# Patient Record
Sex: Female | Born: 1990
Health system: Southern US, Community
[De-identification: ages and names within clinical notes are randomized; demographics above are authoritative.]

## PROBLEM LIST (undated history)

## (undated) DIAGNOSIS — E559 Vitamin D deficiency, unspecified: Secondary | ICD-10-CM

## (undated) DIAGNOSIS — IMO0002 Reserved for concepts with insufficient information to code with codable children: Secondary | ICD-10-CM

## (undated) DIAGNOSIS — A749 Chlamydial infection, unspecified: Secondary | ICD-10-CM

## (undated) DIAGNOSIS — D649 Anemia, unspecified: Secondary | ICD-10-CM

## (undated) DIAGNOSIS — N946 Dysmenorrhea, unspecified: Secondary | ICD-10-CM

## (undated) DIAGNOSIS — N809 Endometriosis, unspecified: Secondary | ICD-10-CM

## (undated) HISTORY — DX: Chlamydial infection, unspecified: A74.9

## (undated) HISTORY — DX: Reserved for concepts with insufficient information to code with codable children: IMO0002

## (undated) HISTORY — DX: Dysmenorrhea, unspecified: N94.6

## (undated) HISTORY — PX: NO PAST SURGERIES: SHX2092

## (undated) HISTORY — DX: Vitamin D deficiency, unspecified: E55.9

---
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
## 2010-03-13 ENCOUNTER — Other Ambulatory Visit (HOSPITAL_COMMUNITY)
Admission: RE | Admit: 2010-03-13 | Discharge: 2010-03-13 | Disposition: A | Discharge status: Home / Self Care | Payer: Managed Care, Other (non HMO) | Source: Ambulatory Visit | Attending: Family Medicine | Admitting: Family Medicine

## 2010-03-13 ENCOUNTER — Other Ambulatory Visit: Payer: Self-pay | Admitting: Family Medicine

## 2010-03-13 ENCOUNTER — Ambulatory Visit (INDEPENDENT_AMBULATORY_CARE_PROVIDER_SITE_OTHER): Payer: Managed Care, Other (non HMO) | Admitting: Family Medicine

## 2010-03-13 DIAGNOSIS — Z124 Encounter for screening for malignant neoplasm of cervix: Secondary | ICD-10-CM | POA: Insufficient documentation

## 2010-03-13 DIAGNOSIS — Z01419 Encounter for gynecological examination (general) (routine) without abnormal findings: Secondary | ICD-10-CM

## 2010-03-13 DIAGNOSIS — R5381 Other malaise: Secondary | ICD-10-CM

## 2010-03-13 DIAGNOSIS — Z0000 Encounter for general adult medical examination without abnormal findings: Secondary | ICD-10-CM

## 2010-03-13 LAB — HM PAP SMEAR

## 2010-05-12 ENCOUNTER — Ambulatory Visit: Payer: Managed Care, Other (non HMO)

## 2010-06-06 ENCOUNTER — Other Ambulatory Visit (INDEPENDENT_AMBULATORY_CARE_PROVIDER_SITE_OTHER): Payer: Managed Care, Other (non HMO)

## 2010-06-06 DIAGNOSIS — Z23 Encounter for immunization: Secondary | ICD-10-CM

## 2010-06-06 DIAGNOSIS — Z0000 Encounter for general adult medical examination without abnormal findings: Secondary | ICD-10-CM

## 2010-10-25 ENCOUNTER — Ambulatory Visit (INDEPENDENT_AMBULATORY_CARE_PROVIDER_SITE_OTHER): Payer: Managed Care, Other (non HMO) | Admitting: Family Medicine

## 2010-10-25 ENCOUNTER — Encounter: Payer: Self-pay | Admitting: Family Medicine

## 2010-10-25 VITALS — BP 112/70 | HR 80 | Ht 66.0 in | Wt 202.0 lb

## 2010-10-25 DIAGNOSIS — N946 Dysmenorrhea, unspecified: Secondary | ICD-10-CM | POA: Insufficient documentation

## 2010-10-25 MED ORDER — NORGESTIM-ETH ESTRAD TRIPHASIC 0.18/0.215/0.25 MG-35 MCG PO TABS: 1.0000 | ORAL_TABLET | Freq: Every day | ORAL | Status: DC

## 2010-10-25 NOTE — Patient Instructions (Signed)
Dysmenorrhea (Painful Periods) Menstrual pain is caused by the muscles of the uterus tightening (contracting) during a menstrual period. The muscles of the uterus contract due to the chemicals in the uterine lining. Primary dysmenorrhea is menstrual cramps that last a couple of days when you start having menstrual periods or soon after. This often begins after a teenager starts having her period. As a woman gets older or has a baby, the cramps will usually lesson or disappear. Secondary dysmenorrhea begins later in life, lasts longer, and the pain may be stronger than primary dysmenorrhea. The pain may start before the period and last a few days after the period. This type of dysmenorrhea is usually caused by an underlying problem such as:  The tissue lining the uterus grows outside of the uterus in other areas of the body (endometriosis).  The endometrial tissue, which normally lines the uterus, is found in or grows into the muscular walls of the uterus (adenomyosis).   The pelvic blood vessels are engorged with blood just before the menstrual period (pelvic congestive syndrome).   Overgrowth of cells in the lining of the uterus or cervix (polyps of the uterus or cervix).   Falling down of the uterus (prolapse) because of loose or stretched ligaments.   Depression.   Bladder problems, infection, or inflammation.  Problems with the intestine, a tumor, or irritable bowel syndrome.   Cancer of the female organs or bladder.   A severely tipped uterus.   A very tight opening or closed cervix.   Noncancerous tumors of the uterus (fibroids).   Pelvic inflammatory disease (PID).   Pelvic scarring (adhesions) from a previous surgery.   Ovarian cyst.   An intrauterine device (IUD) used for birth control.   CAUSES The cause of menstrual pain is often unknown. SYMPTOMS  Cramping or throbbing pain in your lower abdomen.   Sometimes, a woman may also experience headaches.   Lower back  pain.   Feeling sick to your stomach (nausea) or vomiting.   Diarrhea.   Sweating or dizziness.  DIAGNOSIS A diagnosis is based on your history, symptoms, physical examination, diagnostic tests, or procedures. Diagnostic tests or procedures may include:  Blood tests.   An ultrasound.   An examination of the lining of the uterus (dilation and curettage, D&C).   An examination inside your abdomen or pelvis with a scope (laparoscopy).   X-rays.   CT Scan.   MRI.   An examination inside the bladder with a scope (cystoscopy).   An examination inside the intestine or stomach with a scope (colonoscopy, gastroscopy).  TREATMENT Treatment depends on the cause of the dysmenorrhea. Treatment may include:  Pain medication prescribed by your caregiver.   Birth control pills.   Hormone replacement therapy.   Nonsteroidal anti-inflammatory drugs (NSAIDs). These may help stop the production of prostaglandins.   An IUD with progesterone hormone in it.   Acupuncture.   Surgery to remove adhesions, endometriosis, ovarian cyst, or fibroids.   Removal of the uterus (hysterectomy).   Progesterone shots to stop the menstrual period.   Cutting the nerves on the sacrum that go to the female organs (presacral neurectomy).   Electric currant to the sacral nerves (sacral nerve stimulation).   Antidepressant medication.   Psychiatric therapy, counseling, or group therapy.   Exercise and physical therapy.   Meditation and yoga therapy.  HOME CARE INSTRUCTIONS  Only take over-the-counter or prescription medicines for pain, discomfort, or fever as directed by your caregiver.   Place  a heating pad or hot water bottle on your lower back or abdomen. Do not sleep with the heating pad.   Use aerobic exercises, walking, swimming, biking, and other exercises to help lessen the cramping.   Massage to the lower back or abdomen may help.   Stop smoking.   Avoid alcohol and caffeine.    Yoga, meditation, or acupuncture may help.  SEEK MEDICAL CARE IF:  The pain does not get better with medication.   You have pain with sexual intercourse.  SEEK IMMEDIATE MEDICAL CARE IF:  Your pain increases and is not controlled with medications.   You have an oral temperature above 101, not controlled by medicine.   You develop nausea or vomiting with your period not controlled with medication.   You have abnormal vaginal bleeding with your period.   You pass out.  MAKE SURE YOU:  Understand these instructions.   Will watch your condition.   Will get help right away if you are not doing well or get worse.  Document Released: 01/08/2005 Document Re-Released: 06/28/2009 [facility] Patient Information 2011 [place], [place].  Contraceptives, Hormonal How They Work Hormonal contraceptives use either:   A combination of very strong estrogen and progesterone (progestin) in the form of a(n):   Pill, taken for 21 days and then not taken for 7 days.   Patch, placed on the lower abdomen every week for 3 weeks, and not on the fourth week.   Vaginal Ring, placed in the vagina and left there for 3 weeks, and removed for 1 week.   Injection, the hormone is injected once every 28 to 30 days.   Progestin alone in the form of a:   Pill, that is taken every day.   Intrauterine Device (IUD), inserted during a menstrual period and removed or replaced every five years.   Implant, plastic rods placed under the skin of the upper arm and replaced or removed every five years.   Injection, given once every 90 days.  These "female sex hormones" are used in many forms for birth control. These two hormones make up most hormonal contraceptives. 1. Estrogen is the major female hormone. It is responsible for female characteristics. The estrogen compound used in most oral contraceptives (OC) is estradiol. Estrogen is always used with a progestin. When used throughout a menstrual cycle with  progesterone, estrogen:   Stops the actions of other reproductive hormones.   Stops the giving off an egg (ovulation).   Changes the lining of the uterus. This change makes it more difficult for an egg to implant.  1. Progesterone works to prevent pregnancy by:   Blocking ovulation.   Preventing the entry of sperm into the uterus, by keeping the cervical mucus thick and sticky.   Slowing the action of fallopian tubes, to slow sperm transport.   Changing the lining of the uterus to make it more difficult for the fertilized egg to implant.  Progesterone is the hormone that prepares the uterus for the fertilized egg (embryo). Certain kinds of progesterone are used in other kinds of contraceptives. Including:  Levonorgestrel in the IUD.   The Norplant system, which are plastic rods put in the upper arm.   Depo-medroxyprogesterone acetate in the injected Depo-Provera.  Side effects from estrogen occur more often in the first 2 or 3 months, and include:   Nausea and vomiting (can often be controlled by taking the pill during a meal or at bedtime).   Dizziness.   Breast tenderness and enlargement.  Headaches (migraines may get worse).   Estrogen has widespread effects on many other bodily functions, including increasing bone density.   It has mixed effects on the heart.   It appears to improve cholesterol and other lipid levels.   It also increases blood clotting and may increase the risk for stroke in certain women.   May cause swelling of the extremities.   New oral contraceptive (OC) methods with lower dose estrogen (20 mcg and below) may reduce side effects and improve effects on heart and circulation. Such methods may also increase spotting and break-through bleeding (bleeding between periods), depending on the progestin used.  Side effects of progestin occur in both the combination oral contraceptives and in any contraceptive that only uses progestin. Side effects may be  less or more severe, depending on the form and dosage of the contraceptive. Side effects may include:   Stomach pain or cramps.  Diarrhea.   Fatigue, unusual tiredness, weakness.   Hot flashes.   Decreased sex drive.   Nausea.   Trouble sleeping.   Swelling in the face, ankles, or feet.   Headache.   Weight gain.  Unexpected flow of breast milk.   Acne or skin rash (especially with the patch).*   Depression, irritability, or other mood changes.   Changes in uterine bleeding. (If you have higher amounts during periods, spotting and bleeding between periods, or absence of periods, check with your caregiver.)   Breast tenderness.   Hair growth of the face and chest.   Delay or difficulty in getting pregnant afterward.   *Low dose oral contraceptives actually improve acne. Only Ortho Tri-Cyclen is approved for this by the [employer].  In women with heavy menstrual periods, the progesterone IUD may be helpful in slowing down the bleeding. Be aware that pregnancy can occur with any of these contraceptive methods. If there is any suspicion of pregnancy, you should consult your caregiver. Newer, lower dose combination pills may decrease or not have as many side effects. Some progestins used in non-oral contraceptives may not pose a high risk for these side effects. If side effects persist or are severe, you should talk to your caregiver.  Document Released: 01/28/2007 Document Re-Released: 04/04/2009 [facility] Patient Information 2011 [place], [place].

## 2010-10-25 NOTE — Progress Notes (Signed)
Patient presents to discuss birth control options.  She has been having problems with painful periods for years.  She tried taking NSAID's prior to the start of her cycle, but still gets bad cramps.  Is interested in starting birth control pills to lighten her periods, and hopefully lessen the pain.  She is in a sexual relationship, and uses condoms regularly. LMP 9/12.  Last pap 02/2010 (results never received on paper--seen today in computer--absent endocervical transformation zone, otherwise normal).  Denies family history of blood clots, strokes or other problems related to birth control pills or pregnancy.  She sometimes gets headaches around her periods, but no other history of migraines.  Past Medical History  Diagnosis Date  . Dysmenorrhea     History reviewed. No pertinent past surgical history.  History   Social History  . Marital Status: Single    Spouse Name: N/A    Number of Children: N/A  . Years of Education: N/A   Occupational History  . student    Social History Main Topics  . Smoking status: Never Smoker   . Smokeless tobacco: Never Used  . Alcohol Use: No  . Drug Use: No  . Sexually Active: Yes -- Female partner(s)    Birth Control/ Protection: Condom   Other Topics Concern  . 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 Hypertension Mother   . Hypertension Sister    No current outpatient prescriptions on file prior to visit.   No Known Allergies  ROS:  No fevers, URI symptoms, chest pain, palpitations, SOB, edema, GI complaints, or other concerns.  PHYSICAL EXAM: BP 112/70  Pulse 80  Ht 5\' 6"  (1.676 m)  Wt 202 lb (91.627 kg)  BMI 32.60 kg/m2  LMP 10/04/2010 Pleasant, overweight african [place] female in no distress Remainder of visit was limited to discussion  ASSESSMENT/PLAN: 1. Dysmenorrhea  Norgestimate-Ethinyl Estradiol Triphasic  (ORTHO TRI-CYCLEN, 28,) 0.18/0.215/0.25 MG-35 MCG tablet   Discussed potential risks and complications, as well as side effects of birth control pills.  Discussed the need to continue to use condoms for prevention of STD's.  Discussed proper way to take the pills, need for back-up contraception during first month, with antibiotics, and frequent missed pills (but encouraged continued REGULAR condom use).  Recommended that she check her BP once at pharmacy after starting meds to ensure that BP remains normal.  Discussed that it may take 2-3 months for her body to get adjusted to the hormones, and if still having irregular bleeding or problems after 3 months, to f/u here to discuss meds.  F/u in 02/2011 for CPE/pap

## 2010-11-27 ENCOUNTER — Ambulatory Visit (INDEPENDENT_AMBULATORY_CARE_PROVIDER_SITE_OTHER): Payer: Managed Care, Other (non HMO) | Admitting: Family Medicine

## 2010-11-27 ENCOUNTER — Encounter: Payer: Self-pay | Admitting: Family Medicine

## 2010-11-27 VITALS — BP 120/74 | HR 80 | Temp 98.5°F | Ht 66.0 in | Wt 200.0 lb

## 2010-11-27 DIAGNOSIS — H9201 Otalgia, right ear: Secondary | ICD-10-CM

## 2010-11-27 DIAGNOSIS — N946 Dysmenorrhea, unspecified: Secondary | ICD-10-CM

## 2010-11-27 DIAGNOSIS — H9209 Otalgia, unspecified ear: Secondary | ICD-10-CM

## 2010-11-27 MED ORDER — NORGESTIM-ETH ESTRAD TRIPHASIC 0.18/0.215/0.25 MG-35 MCG PO TABS: 1.0000 | ORAL_TABLET | Freq: Every day | ORAL | Status: DC

## 2010-11-27 NOTE — Patient Instructions (Signed)
Apply warm compresses to the right ear for at least 10-15 minutes, multiple times throughout the day (at least 4).  This will help the small cyst drain. Return immediately if significantly increased swelling, redness, fever, worsening pain or other concern for re-evaluation

## 2010-11-27 NOTE — Progress Notes (Signed)
Chief complaint:  right ear pain and swelling x 3 days. Also node behind ear swollen  HPI:  3 days ago began with slight ear pain.  Yesterday noticed a red swollen area in her R external ear.  Denies drainage, just pain.  Denies h/o cartilage piercings.  Denies internal ear pain, decrease in hearing, or fever.  Some nausea this morning.  Asking for mail order of OCP's.  Started them last month for dysmenorrhea, and cramping was less.  Denies side effects.  Past Medical History  Diagnosis Date  . Dysmenorrhea     No past surgical history on file.  History   Social History  . Marital Status: Single    Spouse Name: N/A    Number of Children: N/A  . Years of Education: N/A   Occupational History  . student    Social History Main Topics  . Smoking status: Never Smoker   . Smokeless tobacco: Never Used  . Alcohol Use: No  . Drug Use: No  . Sexually Active: Yes -- Female partner(s)    Birth Control/ Protection: Condom   Other Topics Concern  . 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 Hypertension Mother   . Hypertension Sister     Current outpatient prescriptions:naproxen (NAPROSYN) 500 MG tablet, Take 500 mg by mouth 2 (two) times daily as needed.  , Disp: , Rfl: ;  Norgestimate-Ethinyl Estradiol Triphasic (ORTHO TRI-CYCLEN, 28,) 0.18/0.215/0.25 MG-35 MCG tablet, Take 1 tablet by mouth daily., Disp: 3 Package, Rfl: 0  No Known Allergies  ROS:  Denies fevers, URI or allergy symptoms, GI complaints, GU complaints, skin rashes or other concerns. See HPI  PHYSICAL EXAM: BP 120/74  Pulse 80  Temp 98.5 F (36.9 C)  Ht 5\' 6"  (1.676 m)  Wt 200 lb (90.719 kg)  BMI 32.28 kg/m2  LMP 11/02/2010 Well developed, pleasant female in no distress HEENT:  PERRL, EOMI, conjunctiva clear.  OP clear, no erythema. R ear: small pustule noted with very mild erythema surrounding  the pustule.  No significant soft tissue swelling noted around the area.  TM and EAC normal Neck: No lymphadenopathy Heart: regular rate and rhythm Lungs: clear  ASSESSMENT/PLAN: 1. Otalgia of right ear    2. Dysmenorrhea  Norgestimate-Ethinyl Estradiol Triphasic (ORTHO TRI-CYCLEN, 28,) 0.18/0.215/0.25 MG-35 MCG tablet    R ear pain--due to small cyst.  Recommend warm compresses.  F/U prn worsening, persisting symptoms, which were reviewed in detail  OCP rx--attempted to e-fax (since [facility] doesn't accept e-rx)

## 2010-12-28 ENCOUNTER — Emergency Department (HOSPITAL_COMMUNITY): Payer: Managed Care, Other (non HMO)

## 2010-12-28 ENCOUNTER — Emergency Department (HOSPITAL_COMMUNITY)
Admission: EM | Admit: 2010-12-28 | Discharge: 2010-12-29 | Disposition: A | Discharge status: Home / Self Care | Payer: Managed Care, Other (non HMO) | Attending: Emergency Medicine | Admitting: Emergency Medicine

## 2010-12-28 ENCOUNTER — Encounter (HOSPITAL_COMMUNITY): Payer: Self-pay | Admitting: Emergency Medicine

## 2010-12-28 ENCOUNTER — Other Ambulatory Visit: Payer: Self-pay

## 2010-12-28 DIAGNOSIS — D649 Anemia, unspecified: Secondary | ICD-10-CM

## 2010-12-28 DIAGNOSIS — R404 Transient alteration of awareness: Secondary | ICD-10-CM | POA: Insufficient documentation

## 2010-12-28 DIAGNOSIS — S0003XA Contusion of scalp, initial encounter: Secondary | ICD-10-CM | POA: Insufficient documentation

## 2010-12-28 DIAGNOSIS — M542 Cervicalgia: Secondary | ICD-10-CM | POA: Insufficient documentation

## 2010-12-28 DIAGNOSIS — W19XXXA Unspecified fall, initial encounter: Secondary | ICD-10-CM

## 2010-12-28 DIAGNOSIS — R51 Headache: Secondary | ICD-10-CM | POA: Insufficient documentation

## 2010-12-28 DIAGNOSIS — S0083XA Contusion of other part of head, initial encounter: Secondary | ICD-10-CM

## 2010-12-28 DIAGNOSIS — IMO0002 Reserved for concepts with insufficient information to code with codable children: Secondary | ICD-10-CM | POA: Insufficient documentation

## 2010-12-28 DIAGNOSIS — S0081XA Abrasion of other part of head, initial encounter: Secondary | ICD-10-CM

## 2010-12-28 DIAGNOSIS — R296 Repeated falls: Secondary | ICD-10-CM | POA: Insufficient documentation

## 2010-12-28 HISTORY — DX: Anemia, unspecified: D64.9

## 2010-12-28 LAB — POCT I-STAT, CHEM 8
Calcium, Ion: 1.2 mmol/L (ref 1.12–1.32)
Chloride: 107 mEq/L (ref 96–112)
Creatinine, Ser: 0.7 mg/dL (ref 0.50–1.10)
HCT: 41 % (ref 36.0–46.0)
Potassium: 4.2 mEq/L (ref 3.5–5.1)

## 2010-12-28 LAB — CBC
HCT: 37 % (ref 36.0–46.0)
MCH: 26.7 pg (ref 26.0–34.0)
MCV: 83.7 fL (ref 78.0–100.0)
RBC: 4.42 MIL/uL (ref 3.87–5.11)

## 2010-12-28 MED ORDER — MORPHINE SULFATE 4 MG/ML IJ SOLN
4.0000 mg | Freq: Once | INTRAMUSCULAR | Status: AC
  Administered 2010-12-28: 4 mg via INTRAVENOUS
  Filled 2010-12-28: qty 1

## 2010-12-28 MED ORDER — SODIUM CHLORIDE 0.9 % IV BOLUS (SEPSIS)
1000.0000 mL | Freq: Once | INTRAVENOUS | Status: AC
  Administered 2010-12-28: 1000 mL via INTRAVENOUS

## 2010-12-28 MED ORDER — ONDANSETRON HCL 4 MG/2ML IJ SOLN
4.0000 mg | Freq: Once | INTRAMUSCULAR | Status: AC
  Administered 2010-12-28: 4 mg via INTRAVENOUS
  Filled 2010-12-28: qty 2

## 2010-12-28 NOTE — ED Provider Notes (Signed)
History     CSN: [id number] Arrival date & time: 12/28/2010  9:31 PM   First MD Initiated Contact with Patient 12/28/10 2144      Chief Complaint  Patient presents with  . Fall    (Consider location/radiation/quality/duration/timing/severity/associated sxs/prior treatment) HPI Comments: Patient became overheated while working out.  She stepped outside to get hold fresh air when she lost consciousness and fell.  The right side of her face was the impact of her fall.  She does not remember the fall.  This is happened 2 times before.  Patient has a history of anemia.  Patient is a 20 y.o. female presenting with fall. The history is provided by the patient.  Fall The accident occurred 3 to 5 hours ago. The fall occurred while standing and while recreating/playing. She landed on concrete. The volume of blood lost was minimal. The point of impact was the head. The pain is present in the head. The pain is at a severity of 10/10. The pain is moderate. She was ambulatory at the scene. There was no entrapment after the fall. There was no drug use involved in the accident. There was no alcohol use involved in the accident. Associated symptoms include loss of consciousness. Pertinent negatives include no visual change, no fever, no numbness, no abdominal pain, no bowel incontinence, no nausea, no vomiting, no hematuria, no headaches, no hearing loss and no tingling. The symptoms are aggravated by heat. She has tried nothing for the symptoms.    Past Medical History  Diagnosis Date  . Dysmenorrhea   . Anemia     History reviewed. No pertinent past surgical history.  Family History  Problem Relation Age of Onset  . Hypertension Mother   . Hypertension Sister     History  Substance Use Topics  . Smoking status: Never Smoker   . Smokeless tobacco: Never Used  . Alcohol Use: No    OB History    Grav Para Term Preterm Abortions TAB SAB Ect Mult Living   0 0 0 0 0 0 0 0 0 0       Review  of Systems  Constitutional: Negative for fever.  Gastrointestinal: Negative for nausea, vomiting, abdominal pain and bowel incontinence.  Genitourinary: Negative for hematuria.  Neurological: Positive for loss of consciousness. Negative for tingling, numbness and headaches.    Allergies  Review of patient's allergies indicates no known allergies.  Home Medications   Current Outpatient Rx  Name Route Sig Dispense Refill  . NAPROXEN 500 MG PO TABS Oral Take 500 mg by mouth 2 (two) times daily as needed.      Darlis Loan ESTRAD TRIPHASIC 0.18/0.215/0.25 MG-35 MCG PO TABS Oral Take 1 tablet by mouth daily. 3 Package 0    BP 94/62  Pulse 66  Temp(Src) 97.9 F (36.6 C) (Oral)  Resp 18  SpO2 98%  LMP 12/27/2010  Physical Exam  ED Course  Procedures (including critical care time)   Date: 12/29/2010  Rate: 72  Rhythm: normal sinus rhythm  QRS Axis: normal  Intervals: normal  ST/T Wave abnormalities: normal  Conduction Disutrbances:none  Narrative Interpretation:   Old EKG Reviewed: none available   Labs Reviewed  CBC - Abnormal; Notable for the following:    Hemoglobin 11.8 (*)    All other components within normal limits  POCT I-STAT, CHEM 8  I-STAT, CHEM 8  PREGNANCY, URINE   No results found.   No diagnosis found.  All CT results discussed with patient and  her mother.  Patient is to followup with her primary care doctor at [facility] next week for a full anemia workup.   MDM  Stable chronic anemia-  Discussed with pt & mother, anemia to be evaluated by PCP at follow up apt. Fall; facial abrasions, contusions       [place], [place] 12/29/10 0112

## 2010-12-28 NOTE — ED Notes (Signed)
PT. FELL AND PASSED OUT WHILE WORKING AT A GYM THIS EVENING , PRESENTS WITH RIGHT FACIAL ABRASIONS AND RIGHT FOREHEAD BRUISE.

## 2010-12-28 NOTE — ED Notes (Signed)
Pt states that she was at home and she felt hot she went to step outside and she passed out. Pt states that she does not remember anything after passing out except that she woke up and was sitting she called her sister and she had scraps on her head and knuckles. Pt has abbrasions to her forehead and check. Pt alert and oriented x4 currently pt able to move all extremities and follow commands.

## 2010-12-28 NOTE — ED Notes (Signed)
Pt w/hx of anemia passed out 40 minutes ago after exercising.  Does not remember what happened, but presently AO x 4.  Hematoma to R forehead and abbraisions to R face and R forehead.  Denies neck or back pain.

## 2010-12-29 LAB — PREGNANCY, URINE: Preg Test, Ur: NEGATIVE

## 2010-12-29 MED ORDER — MORPHINE SULFATE 4 MG/ML IJ SOLN
4.0000 mg | Freq: Once | INTRAMUSCULAR | Status: AC
  Administered 2010-12-29: 4 mg via INTRAVENOUS
  Filled 2010-12-29: qty 1

## 2010-12-29 MED ORDER — OXYCODONE-ACETAMINOPHEN 5-325 MG PO TABS: 1.0000 | ORAL_TABLET | Freq: Four times a day (QID) | ORAL | Status: DC | PRN

## 2010-12-30 NOTE — ED Provider Notes (Signed)
Medical screening examination/treatment/procedure(s) were performed by non-physician practitioner and as supervising physician I was immediately available for consultation/collaboration.   Wilder Amodei L San Lohmeyer, MD 12/30/10 0807 

## 2011-01-01 ENCOUNTER — Encounter: Payer: Self-pay | Admitting: Medical

## 2011-01-01 ENCOUNTER — Ambulatory Visit (INDEPENDENT_AMBULATORY_CARE_PROVIDER_SITE_OTHER): Payer: Managed Care, Other (non HMO) | Admitting: Medical

## 2011-01-01 VITALS — BP 120/80 | HR 60 | Temp 98.1°F | Resp 16

## 2011-01-01 DIAGNOSIS — T148XXA Other injury of unspecified body region, initial encounter: Secondary | ICD-10-CM | POA: Insufficient documentation

## 2011-01-01 DIAGNOSIS — S1093XA Contusion of unspecified part of neck, initial encounter: Secondary | ICD-10-CM

## 2011-01-01 DIAGNOSIS — D649 Anemia, unspecified: Secondary | ICD-10-CM | POA: Insufficient documentation

## 2011-01-01 DIAGNOSIS — S0083XA Contusion of other part of head, initial encounter: Secondary | ICD-10-CM | POA: Insufficient documentation

## 2011-01-01 DIAGNOSIS — W19XXXA Unspecified fall, initial encounter: Secondary | ICD-10-CM

## 2011-01-01 DIAGNOSIS — S0003XA Contusion of scalp, initial encounter: Secondary | ICD-10-CM

## 2011-01-01 DIAGNOSIS — IMO0002 Reserved for concepts with insufficient information to code with codable children: Secondary | ICD-10-CM

## 2011-01-01 MED ORDER — HYDROCODONE-ACETAMINOPHEN 5-500 MG PO TABS: 1.0000 | ORAL_TABLET | Freq: Four times a day (QID) | ORAL | Status: AC | PRN

## 2011-01-01 NOTE — Progress Notes (Signed)
Subjective:   HPI  Caitlin Duncan is a 20 y.o. female who presents with hospital f/u.  Was seen at the ED this past weekend after falling and hitting face.   She was at the gym, on the elliptical, felt flush, and when she went outside she fainted, hitting face against the concrete.  She had some labs, CT head, EKG at the ED and was advised to f/u here on anemia.  She notes that the day she fell, she had not eaten all morning and exercised in the afternoon.  She notes that she doesn't usually eat breakfast and goes hours between meals.  She notes 2 other prior episodes of fainting, once in 8th grade after getting up too fast, and once in 10 th grade when she felt like she became over heated.  She notes hx/o anemia even in middle school.  She has heavy periods, and was recently put on different birth control 90mo ago which has helped with the heavy periods.   She denise blood in stool and urine.  She has never been on iron.  She is using neosporin on the facial abrasions.  Needs refill on pain medication.   She denies family hx/o anemia, thyroid problems, or heart problems.  No other aggravating or relieving factors.    No other c/o.  The following portions of the patient's history were reviewed and updated as appropriate: allergies, current medications, past family history, past medical history, past social history, past surgical history and problem list.  Past Medical History  Diagnosis Date  . Dysmenorrhea   . Anemia     Review of Systems Constitutional: -fever, -chills, -sweats, -unexpected -weight change,-fatigue ENT: -runny nose, -ear pain, -sore throat Cardiology:  -chest pain, -palpitations, -edema Respiratory: -cough, -shortness of breath, -wheezing Gastroenterology: -abdominal pain, -nausea, -vomiting, -diarrhea, -constipation Hematology: -bleeding or bruising problems Musculoskeletal: -arthralgias, -myalgias, -joint swelling, -back pain Ophthalmology: -vision changes Urology:  -dysuria, -difficulty urinating, -hematuria, -urinary frequency, -urgency Neurology: -headache, -weakness, -tingling, -numbness    Objective:   Physical Exam  Filed Vitals:   01/01/11 1501  BP: 120/80  Pulse: 60  Temp: 98.1 F (36.7 C)  Resp: 16    General appearance: alert, no distress, WD/WN, black female Skin: right face with several medium to large abrasions on forehead cheek and jaw HEENT: normocephalic, sclerae anicteric, TMs pearly, nares patent, no discharge or erythema, pharynx normal Oral cavity: MMM, no lesions Neck: supple, no lymphadenopathy, no thyromegaly, no masses Heart: RRR, normal S1, S2, no murmurs Lungs: CTA bilaterally, no wheezes, rhonchi, or rales Abdomen: +bs, soft, non tender, non distended, no masses, no hepatomegaly, no splenomegaly Pulses: 2+ symmetric, upper and lower extremities, normal cap refill Neuro: CN2-12 intact, nonfocal  Assessment and Plan :    Encounter Diagnoses  Name Primary?  Marland Kitchen Anemia Yes  . Fall   . Abrasion   . Contusion of face    Reviewed ED report, CT scans of face, head, and neck, labs, EKG report from this past weekend.   Anemia - labs today, sent home with Hemoccult cards.  Anemia likely iron deficiency due to heavy periods.   Fall - advised that she likely has had episodes of hypoglycemia.  Advised she not skip breakfast, not go long periods without meals.  Abrasion - c/t ice, neosporin  Contusion - ice, and symptoms should resolve with time  Follow-up pending labs.

## 2011-01-01 NOTE — Progress Notes (Signed)
Addended by: Jac Canavan on: 01/01/2011 10:32 PM   Modules accepted: Orders

## 2011-01-02 LAB — PROTIME-INR: Prothrombin Time: 13.2 seconds (ref 11.6–15.2)

## 2011-01-02 LAB — IRON AND TIBC
%SAT: 13 % — ABNORMAL LOW (ref 20–55)
Iron: 53 ug/dL (ref 42–145)

## 2011-01-02 LAB — CBC WITH DIFFERENTIAL/PLATELET
Basophils Relative: 1 % (ref 0–1)
Eosinophils Relative: 1 % (ref 0–5)
Hemoglobin: 11 g/dL — ABNORMAL LOW (ref 12.0–15.0)
MCH: 26.2 pg (ref 26.0–34.0)
MCV: 84 fL (ref 78.0–100.0)
Monocytes Absolute: 0.5 10*3/uL (ref 0.1–1.0)
Neutro Abs: 3 10*3/uL (ref 1.7–7.7)
Neutrophils Relative %: 56 % (ref 43–77)
Platelets: 258 10*3/uL (ref 150–400)
WBC: 5.4 10*3/uL (ref 4.0–10.5)

## 2011-01-02 LAB — HEPATIC FUNCTION PANEL
ALT: <8 U/L (ref 0–35)
AST: 12 U/L (ref 0–37)
Albumin: 3.5 g/dL (ref 3.5–5.2)
Alkaline Phosphatase: 49 U/L (ref 39–117)
Total Bilirubin: 0.2 mg/dL — ABNORMAL LOW (ref 0.3–1.2)
Total Protein: 6.4 g/dL (ref 6.0–8.3)

## 2011-01-17 ENCOUNTER — Ambulatory Visit (INDEPENDENT_AMBULATORY_CARE_PROVIDER_SITE_OTHER): Payer: Managed Care, Other (non HMO) | Admitting: Medical

## 2011-01-17 ENCOUNTER — Encounter: Payer: Self-pay | Admitting: Medical

## 2011-01-17 VITALS — BP 118/80 | HR 76 | Temp 98.9°F | Wt 212.0 lb

## 2011-01-17 DIAGNOSIS — S0081XA Abrasion of other part of head, initial encounter: Secondary | ICD-10-CM

## 2011-01-17 DIAGNOSIS — IMO0002 Reserved for concepts with insufficient information to code with codable children: Secondary | ICD-10-CM

## 2011-01-17 DIAGNOSIS — N920 Excessive and frequent menstruation with regular cycle: Secondary | ICD-10-CM

## 2011-01-17 DIAGNOSIS — D649 Anemia, unspecified: Secondary | ICD-10-CM

## 2011-01-17 DIAGNOSIS — R42 Dizziness and giddiness: Secondary | ICD-10-CM

## 2011-01-17 LAB — HEMOCCULT GUIAC POC 1CARD (OFFICE)
Card #1 Date: 12
Card #3 Fecal Occult Blood, POC: NEGATIVE

## 2011-01-17 MED ORDER — FERROUS GLUCONATE 325 (36 FE) MG PO TABS: 1.0000 | ORAL_TABLET | Freq: Every day | ORAL | Status: DC

## 2011-01-17 NOTE — Progress Notes (Signed)
Subjective:    Caitlin Duncan is a 19 y.o. female who presents for f/u and lab results.  I saw her recently after she had been to the ED for a fall.  Since then she took my advice and has been eating more regularly to avoid hypoglycemia, and has had no more fainting spells.  She feels better in general.  She is exercising regularly.  She is using coca butter on her face and the abrasions are healing up well.  She does note hx/o heavy periods, was put on OCPs in September by Dr. Lynelle Doctor, and has planned pap smear and f/u in February with Dr. Lynelle Doctor here.  She brought her stool guaiac cards in today.  She notes 1 recent episode of blood on toilet paper, but otherwise the only other bleeding is her heavy periods.   No other aggravating or relieving factors.    No other c/o.  The following portions of the patient's history were reviewed and updated as appropriate: allergies, current medications, past family history, past medical history, past social history, past surgical history and problem list.  Past Medical History  Diagnosis Date  . Dysmenorrhea   . Anemia     Review of Systems Constitutional: -fever, -chills, -sweats, -unexpected -weight change,-fatigue ENT: -runny nose, -ear pain, -sore throat Cardiology:  -chest pain, -palpitations, -edema Respiratory: -cough, -shortness of breath, -wheezing Gastroenterology: -abdominal pain, -nausea, -vomiting, -diarrhea, -constipation Hematology: +bleeding or bruising problems Musculoskeletal: -arthralgias, -myalgias, -joint swelling, -back pain Ophthalmology: -vision changes Urology: -dysuria, -difficulty urinating, -hematuria, -urinary frequency, -urgency Neurology: -headache, -weakness, -tingling, -numbness      Objective:   Physical Exam  Filed Vitals:   01/17/11 1150  BP: 118/80  Pulse: 76  Temp: 98.9 F (37.2 C)    General appearance: alert, no distress, WD/WN  Oral cavity: MMM, no lesions Neck: supple, no lymphadenopathy, no  thyromegaly, no masses Heart: RRR, normal S1, S2, no murmurs Lungs: CTA bilaterally, no wheezes, rhonchi, or rales Abdomen: +bs, soft, non tender, non distended, no masses, no hepatomegaly, no splenomegaly Pulses: 2+ symmetric, upper and lower extremities, normal cap refill Rectal: declines   Assessment and Plan :    Encounter Diagnoses  Name Primary?  Marland Kitchen Anemia, unspecified Yes  . Dizziness   . Heavy periods   . Abrasion of face    Discussed her recent labs.  Her iron and ferritin were low normal.  Given her heavy periods, I advised she begin once daily iron.  Script given, discussed potential for constipation.  She has had no more dizziness since modifying her diet.  She is going on 40mo with the new OCP.  Her facial abrasions are healing appropriately.  She can c/t coca butter.  She will f/u in February with Dr. Lynelle Doctor for pap smear, pelvic exam, and recheck on OCPs, periods, and anemia at that time.

## 2011-01-17 NOTE — Patient Instructions (Signed)

## 2011-03-13 ENCOUNTER — Other Ambulatory Visit (HOSPITAL_COMMUNITY)
Admission: RE | Admit: 2011-03-13 | Discharge: 2011-03-13 | Disposition: A | Discharge status: Home / Self Care | Payer: Managed Care, Other (non HMO) | Source: Ambulatory Visit | Attending: Family Medicine | Admitting: Family Medicine

## 2011-03-13 DIAGNOSIS — Z01419 Encounter for gynecological examination (general) (routine) without abnormal findings: Secondary | ICD-10-CM | POA: Insufficient documentation

## 2011-03-14 ENCOUNTER — Encounter: Payer: Self-pay | Admitting: Family Medicine

## 2011-03-14 ENCOUNTER — Ambulatory Visit (INDEPENDENT_AMBULATORY_CARE_PROVIDER_SITE_OTHER): Payer: Managed Care, Other (non HMO) | Admitting: Family Medicine

## 2011-03-14 VITALS — BP 128/82 | HR 80 | Ht 66.0 in | Wt 202.0 lb

## 2011-03-14 DIAGNOSIS — Z0000 Encounter for general adult medical examination without abnormal findings: Secondary | ICD-10-CM

## 2011-03-14 DIAGNOSIS — N920 Excessive and frequent menstruation with regular cycle: Secondary | ICD-10-CM

## 2011-03-14 DIAGNOSIS — Z23 Encounter for immunization: Secondary | ICD-10-CM

## 2011-03-14 DIAGNOSIS — D649 Anemia, unspecified: Secondary | ICD-10-CM

## 2011-03-14 DIAGNOSIS — N946 Dysmenorrhea, unspecified: Secondary | ICD-10-CM

## 2011-03-14 DIAGNOSIS — R112 Nausea with vomiting, unspecified: Secondary | ICD-10-CM

## 2011-03-14 DIAGNOSIS — Z13220 Encounter for screening for lipoid disorders: Secondary | ICD-10-CM

## 2011-03-14 LAB — CBC WITH DIFFERENTIAL/PLATELET
Basophils Absolute: 0 10*3/uL (ref 0.0–0.1)
HCT: 36.3 % (ref 36.0–46.0)
Hemoglobin: 11.3 g/dL — ABNORMAL LOW (ref 12.0–15.0)
Lymphocytes Relative: 14 % (ref 12–46)
Lymphs Abs: 0.9 10*3/uL (ref 0.7–4.0)
Monocytes Absolute: 0.7 10*3/uL (ref 0.1–1.0)
Neutro Abs: 4.6 10*3/uL (ref 1.7–7.7)
RBC: 4.26 MIL/uL (ref 3.87–5.11)
RDW: 14.4 % (ref 11.5–15.5)
WBC: 6.2 10*3/uL (ref 4.0–10.5)

## 2011-03-14 LAB — POCT URINALYSIS DIPSTICK
Bilirubin, UA: NEGATIVE
Blood, UA: NEGATIVE
Ketones, UA: NEGATIVE
Nitrite, UA: NEGATIVE
Spec Grav, UA: 1.02

## 2011-03-14 LAB — LIPID PANEL
Cholesterol: 163 mg/dL (ref 0–200)
Triglycerides: 60 mg/dL (ref <150)
VLDL: 12 mg/dL (ref 0–40)

## 2011-03-14 MED ORDER — NORGESTIM-ETH ESTRAD TRIPHASIC 0.18/0.215/0.25 MG-35 MCG PO TABS: 1.0000 | ORAL_TABLET | Freq: Every day | ORAL | Status: DC

## 2011-03-14 NOTE — Patient Instructions (Addendum)
HEALTH MAINTENANCE RECOMMENDATIONS:  It is recommended that you get at least 30 minutes of aerobic exercise at least 5 days/week (for weight loss, you may need as much as 60-90 minutes). This can be any activity that gets your heart rate up. This can be divided in 10-15 minute intervals if needed, but try and build up your endurance at least once a week.  Weight bearing exercise is also recommended twice weekly.  Eat a healthy diet with lots of vegetables, fruits and fiber.  "Colorful" foods have a lot of vitamins (ie green vegetables, tomatoes, red peppers, etc).  Limit sweet tea, regular sodas and alcoholic beverages, all of which has a lot of calories and sugar.  Up to 1 alcoholic drink daily may be beneficial for women (unless trying to lose weight, watch sugars).  Drink a lot of water.  Calcium recommendations are 1200-1500 mg daily (1500 mg for postmenopausal women or women without ovaries), and vitamin D 1000 IU daily.  This should be obtained from diet and/or supplements (vitamins), and calcium should not be taken all at once, but in divided doses.  Monthly self breast exams and yearly mammograms for women over the age of 38 is recommended.  Sunscreen of at least SPF 30 should be used on all sun-exposed parts of the skin when outside between the hours of 10 am and 4 pm (not just when at beach or pool, but even with exercise, golf, tennis, and yard work!)  Use a sunscreen that says "broad spectrum" so it covers both UVA and UVB rays, and make sure to reapply every 1-2 hours.  Remember to change the batteries in your smoke detectors when changing your clock times in the spring and fall.  Use your seat belt every time you are in a car, and please drive safely and not be distracted with cell phones and texting while driving.  PLEASE GET [place] COPY OF YOUR IMMUNIZATIONS--I need to know when your last tetanus shot was, and if it was a TD or a TdaP.  If we don't ever receive them, we will end up giving  your another shot.  Nausea/vomiting--without diarrhea, is most likely related to reflux, especially with symptoms mainly in the morning.  Trial of Prilosec OTC once daily--might want to take in the evening rather than morning, so has its full effect at morningtime.  Cut back on citrus/acidic foods.  Avoid spicy foods, alcohol and caffeine.  Diet for GERD or PUD Nutrition therapy can help ease the discomfort of gastroesophageal reflux disease (GERD) and peptic ulcer disease (PUD).  HOME CARE INSTRUCTIONS   Eat your meals slowly, in a relaxed setting.   Eat 5 to 6 small meals per day.   If a food causes distress, stop eating it for a period of time.  FOODS TO AVOID  Coffee, regular or decaffeinated.   Cola beverages, regular or low calorie.   Tea, regular or decaffeinated.   Pepper.   Cocoa.   High fat foods, including meats.   Butter, margarine, hydrogenated oil (trans fats).   Peppermint or spearmint (if you have GERD).   Fruits and vegetables if not tolerated.   Alcohol.   Nicotine (smoking or chewing). This is one of the most potent stimulants to acid production in the gastrointestinal tract.   Any food that seems to aggravate your condition.  If you have questions regarding your diet, ask your caregiver or a registered dietitian. TIPS  Lying flat may make symptoms worse. Keep the head of  your bed raised 6 to 9 inches (15 to 23 cm) by using a foam wedge or blocks under the legs of the bed.   Do not lay down until 3 hours after eating a meal.   Daily physical activity may help reduce symptoms.  MAKE SURE YOU:   Understand these instructions.   Will watch your condition.   Will get help right away if you are not doing well or get worse.  Document Released: 01/08/2005 Document Revised: 09/20/2010 Document Reviewed: 05/24/2008 [facility] Patient Information 2012 [place], [place].

## 2011-03-14 NOTE — Progress Notes (Signed)
Caitlin Duncan is a 21 y.o. female who presents for a complete physical.  She has the following concerns:  Dysmenorrhea and heavy periods--started on OCP's in September, and periods are lighter and less painful, although still gets cramps.  Denies any side effects.  She isn't currently sexually active.  Was with same partner after last pap, but no other partners since.   Had fainting episode in December.  See ER notes and f/u with Vincenza Hews.  Was diagnosed with anemia, and put on iron once daily, which she has been taking.  No further fainting spells or dizziness. Also was felt to have hypoglycemia contributing, and she is eating more regularly now.  Complains of nausea and vomiting since Saturday night.  Got sick that evening, after going out to dinner.  Complaining of nausea and vomiting only in the mornings, when she wakes up, since that time.  Stomach is empty, so throwing up yellow fluid. Denies any diarrhea, stools have been normal.  Able to eat fine and no nausea/vomiting throughout the rest of the day, although she has decreased appetite, kept food down.  +burping, and some heartburn.  Denies significant caffeine. Denies spicy foods.  Health Maintenance: Immunization History  Administered Date(s) Administered  . HPV Quadrivalent 03/13/2010, 06/06/2010  No immunization records were ever received from parent.  Last tetanus isn't known by patient. Last Pap smear: 1 year ago, normal but no endocervical cells Last mammogram: never Last colonoscopy: never Last DEXA: never Dentist: due in March Ophtho: yearly Exercise: irregular since December  Past Medical History  Diagnosis Date  . Dysmenorrhea   . Anemia     History reviewed. No pertinent past surgical history.  History   Social History  . Marital Status: Single    Spouse Name: N/A    Number of Children: N/A  . Years of Education: N/A   Occupational History  . student    Social History Main Topics  . Smoking status:  Never Smoker   . Smokeless tobacco: Never Used  . Alcohol Use: No  . Drug Use: No  . Sexually Active: Not Currently -- Female partner(s)    Birth Control/ Protection: Condom, Pill   Other Topics Concern  . 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 Lives in student housing with sister and roommate   Family History  Problem Relation Age of Onset  . Hypertension Mother   . Hypertension Sister    Current outpatient prescriptions:Ferrous Gluconate 325 (36 FE) MG TABS, Take 1 tablet by mouth daily. 1 tablet po daily with orange juice or food, Disp: 30 tablet, Rfl: 2;  Norgestimate-Ethinyl Estradiol Triphasic (ORTHO TRI-CYCLEN, 28,) 0.18/0.215/0.25 MG-35 MCG tablet, Take 1 tablet by mouth daily., Disp: 3 Package, Rfl: 3  No Known Allergies  ROS: The patient denies anorexia, fever, weight changes, headaches,  vision changes, decreased hearing, ear pain, sore throat, breast concerns, chest pain, palpitations, dizziness, syncope, dyspnea on exertion, cough, swelling, diarrhea, constipation, abdominal pain, melena, hematochezia, hematuria, incontinence, dysuria, irregular menstrual cycles, vaginal discharge, odor or itch, genital lesions, joint pains, numbness, tingling, weakness, tremor, suspicious skin lesions, depression, anxiety, abnormal bleeding/bruising, or enlarged lymph nodes.  PHYSICAL EXAM: BP 128/82  Pulse 80  Ht 5\' 6"  (1.676 m)  Wt 202 lb (91.627 kg)  BMI 32.60 kg/m2  LMP 02/23/2011  General Appearance:    Alert, cooperative, no distress, appears stated age  Head:    Normocephalic, without obvious abnormality, atraumatic  Eyes:  PERRL, conjunctiva/corneas clear, EOM's intact, fundi    benign  Ears:    Normal TM's and external ear canals  Nose:   Nares normal, mucosa normal, no drainage or sinus   tenderness  Throat:   Lips, mucosa, and tongue normal; teeth and gums normal  Neck:   Supple, no lymphadenopathy;  thyroid:  no    enlargement/tenderness/nodules; no carotid   bruit or JVD  Back:    Spine nontender, no curvature, ROM normal, no CVA     tenderness  Lungs:     Clear to auscultation bilaterally without wheezes, rales or     ronchi; respirations unlabored  Chest Wall:    No tenderness or deformity   Heart:    Regular rate and rhythm, S1 and S2 normal, no murmur, rub   or gallop  Breast Exam:    No tenderness, masses, or nipple discharge or inversion.      No axillary lymphadenopathy. Significant fibrocystic changes in upper outer quadrants bilaterally.  No discreet mass  Abdomen:     Soft, non-tender, nondistended, normoactive bowel sounds,    no masses, no hepatosplenomegaly  Genitalia:    Normal external genitalia without lesions.  BUS and vagina normal; cervix without lesions, or cervical motion tenderness. No abnormal vaginal discharge.  Uterus and adnexa not enlarged, nontender, no masses.  Pap performed  Rectal:    Not performed due to age<40 and no related complaints  Extremities:   No clubbing, cyanosis or edema  Pulses:   2+ and symmetric all extremities  Skin:   Skin color, texture, turgor normal, no rashes or lesions  Lymph nodes:   Cervical, supraclavicular, and axillary nodes normal  Neurologic:   CNII-XII intact, normal strength, sensation and gait; reflexes 2+ and symmetric throughout          Psych:   Normal mood, affect, hygiene and grooming.    ASSESSMENT/PLAN: 1. Routine general medical examination at a health care facility  POCT Urinalysis Dipstick, Visual acuity screening, Cytology - PAP, Lipid panel  2. Need for HPV vaccination  HPV vaccine quadravalent 3 dose IM  3. Heavy periods    4. Dysmenorrhea  Norgestimate-Ethinyl Estradiol Triphasic (ORTHO TRI-CYCLEN, 28,) 0.18/0.215/0.25 MG-35 MCG tablet  5. Anemia, unspecified  CBC with Differential, Ferritin  6. Screening for lipoid disorders  Lipid panel  7. Nausea and vomiting     Nausea/vomiting--without diarrhea, is most likely  related to reflux, especially with symptoms mainly in the morning.  Trial of Prilosec OTC once daily--might want to take in the evening rather than morning, so has its full effect at morningtime.  Cut back on citrus/acidic foods.  Avoid spicy foods, alcohol and caffeine.  Discussed monthly self breast exams, at least 30 minutes of aerobic activity at least 5 days/week; proper sunscreen use reviewed; healthy diet, including goals of calcium and vitamin D intake and alcohol recommendations (less than or equal to 1 drink/day) reviewed; regular seatbelt use; changing batteries in smoke detectors.  Immunization recommendations discussed--to try and get copies of immunizations. If none received, give TdaP at next visit.    3rd Gardisil given today

## 2011-03-15 ENCOUNTER — Ambulatory Visit (INDEPENDENT_AMBULATORY_CARE_PROVIDER_SITE_OTHER): Payer: Managed Care, Other (non HMO) | Admitting: Family Medicine

## 2011-03-15 ENCOUNTER — Encounter: Payer: Self-pay | Admitting: Family Medicine

## 2011-03-15 VITALS — BP 124/76 | HR 76 | Temp 98.2°F | Ht 66.0 in | Wt 205.0 lb

## 2011-03-15 DIAGNOSIS — J069 Acute upper respiratory infection, unspecified: Secondary | ICD-10-CM

## 2011-03-15 DIAGNOSIS — K5289 Other specified noninfective gastroenteritis and colitis: Secondary | ICD-10-CM

## 2011-03-15 DIAGNOSIS — K529 Noninfective gastroenteritis and colitis, unspecified: Secondary | ICD-10-CM

## 2011-03-15 DIAGNOSIS — J040 Acute laryngitis: Secondary | ICD-10-CM

## 2011-03-15 NOTE — Patient Instructions (Signed)
URI--you may use decongestants as needed to dry up the runny nose, and if you have any sinus pain.  You may use Robitussin or Mucinex (plain or DM) as needed to help with cough.  Drink plenty of fluids.  If your mucus becomes discolored, increasing sinus pain or fevers over the next few days to a week, then you might have gotten a bacterial infection on top of this viral infection, and might need antibiotics, but they are not indicated now.  Gastroenteritis. Drink plenty of fluids.  Avoid diary.  BRAT diet.  You may use imodium or pepto bismol as needed.  Return for re-evaluation if  Signs of dehydration (not peeing, dizzy, weak, fast heart rate).  Seek medical care if worsening abdominal pain, high fevers, blood in stool, or other concerns.    Diet for Diarrhea, Adult Having frequent, runny stools (diarrhea) has many causes. Diarrhea may be caused or worsened by food or drink. Diarrhea may be relieved by changing your diet. IF YOU ARE NOT TOLERATING SOLID FOODS:  Drink enough water and fluids to keep your urine clear or pale yellow.   Avoid sugary drinks and sodas as well as milk-based beverages.   Avoid beverages containing caffeine and alcohol.   You may try rehydrating beverages. You can make your own by following this recipe:    tsp table salt.    tsp baking soda.   ? tsp salt substitute (potassium chloride).   1 tbs + 1 tsp sugar.   1 qt water.  As your stools become more solid, you can start eating solid foods. Add foods one at a time. If a certain food causes your diarrhea to get worse, avoid that food and try other foods. A low fiber, low-fat, and lactose-free diet is recommended. Small, frequent meals may be better tolerated.  Starches  Allowed:  White, [place], and pita breads, plain rolls, buns, bagels. Plain muffins, matzo. Soda, saltine, or graham crackers. Pretzels, melba toast, zwieback. Cooked cereals made with water: cornmeal, farina, cream cereals. Dry cereals:  refined corn, wheat, rice. Potatoes prepared any way without skins, refined macaroni, spaghetti, noodles, refined rice.   Avoid:  Bread, rolls, or crackers made with whole wheat, multi-grains, rye, bran seeds, nuts, or coconut. Corn tortillas or taco shells. Cereals containing whole grains, multi-grains, bran, coconut, nuts, or raisins. Cooked or dry oatmeal. Coarse wheat cereals, granola. Cereals advertised as "high-fiber." Potato skins. Whole grain pasta, wild or brown rice. Popcorn. Sweet potatoes/yams. Sweet rolls, doughnuts, waffles, pancakes, sweet breads.  Vegetables  Allowed: Strained tomato and vegetable juices. Most well-cooked and canned vegetables without seeds. Fresh: Tender lettuce, cucumber without the skin, cabbage, spinach, bean sprouts.   Avoid: Fresh, cooked, or canned: Artichokes, baked beans, beet greens, broccoli, Brussels sprouts, corn, kale, legumes, peas, sweet potatoes. Cooked: Green or red cabbage, spinach. Avoid large servings of any vegetables, because vegetables shrink when cooked, and they contain more fiber per serving than fresh vegetables.  Fruit  Allowed: All fruit juices except prune juice. Cooked or canned: Apricots, applesauce, cantaloupe, cherries, fruit cocktail, grapefruit, grapes, kiwi, mandarin oranges, peaches, pears, plums, watermelon. Fresh: Apples without skin, ripe banana, grapes, cantaloupe, cherries, grapefruit, peaches, oranges, plums. Keep servings limited to  cup or 1 piece.   Avoid: Fresh: Apple with skin, apricots, mango, pears, raspberries, strawberries. Prune juice, stewed or dried prunes. Dried fruits, raisins, dates. Large servings of all fresh fruits.  Meat and Meat Substitutes  Allowed: Ground or well-cooked tender beef, ham, veal, lamb, pork,  or poultry. Eggs, plain cheese. Fish, oysters, shrimp, lobster, other seafoods. Liver, organ meats.   Avoid: Tough, fibrous meats with gristle. Peanut butter, smooth or chunky. Cheese, nuts, seeds,  legumes, dried peas, beans, lentils.  Milk  Allowed: Yogurt, lactose-free milk, kefir, drinkable yogurt, buttermilk, soy milk.   Avoid: Milk, chocolate milk, beverages made with milk, such as milk shakes.  Soups  Allowed: Bouillon, broth, or soups made from allowed foods. Any strained soup.   Avoid: Soups made from vegetables that are not allowed, cream or milk-based soups.  Desserts and Sweets  Allowed: Sugar-free gelatin, sugar-free frozen ice pops made without sugar alcohol.   Avoid: Plain cakes and cookies, pie made with allowed fruit, pudding, custard, cream pie. Gelatin, fruit, ice, sherbet, frozen ice pops. Ice cream, ice milk without nuts. Plain hard candy, honey, jelly, molasses, syrup, sugar, chocolate syrup, gumdrops, marshmallows.  Fats and Oils  Allowed: Avoid any fats and oils.   Avoid: Seeds, nuts, olives, avocados. Margarine, butter, cream, mayonnaise, salad oils, plain salad dressings made from allowed foods. Plain gravy, crisp bacon without rind.  Beverages  Allowed: Water, decaffeinated teas, oral rehydration solutions, sugar-free beverages.   Avoid: Fruit juices, caffeinated beverages (coffee, tea, soda or pop), alcohol, sports drinks, or lemon-lime soda or pop.  Condiments  Allowed: Ketchup, mustard, horseradish, vinegar, cream sauce, cheese sauce, cocoa powder. Spices in moderation: allspice, basil, bay leaves, celery powder or leaves, cinnamon, cumin powder, curry powder, ginger, mace, marjoram, onion or garlic powder, oregano, paprika, parsley flakes, ground pepper, rosemary, sage, savory, tarragon, thyme, turmeric.   Avoid: Coconut, honey.  Weight Monitoring: Weigh yourself every day. You should weigh yourself in the morning after you urinate and before you eat breakfast. Wear the same amount of clothing when you weigh yourself. Record your weight daily. Bring your recorded weights to your clinic visits. Tell your caregiver right away if you have gained 3  lb/1.4 kg or more in 1 day, 5 lb/2.3 kg in a week, or whatever amount you were told to report. SEEK IMMEDIATE MEDICAL CARE IF:   You are unable to keep fluids down.   You start to throw up (vomit) or diarrhea keeps coming back (persistent).   Abdominal pain develops, increases, or can be felt in one place (localizes).   You have an oral temperature above 102 F (38.9 C), not controlled by medicine.   Diarrhea contains blood or mucus.   You develop excessive weakness, dizziness, fainting, or extreme thirst.  MAKE SURE YOU:   Understand these instructions.   Will watch your condition.   Will get help right away if you are not doing well or get worse.  Document Released: 03/31/2003 Document Revised: 09/20/2010 Document Reviewed: 07/22/2008 [facility] Patient Information 2012 [place], [place].

## 2011-03-15 NOTE — Progress Notes (Signed)
Chief complaint:  Cough started this am, woke up with laryngitis and diarrhea. Vomiting stopped yesterday, just one time in the am. Abdominal pain since this am  HPI: Cough with clear mucus and nasal congestion, runny nose x 2 days. This morning woke up with laryngitis.  Denies fevers, chills, shortness of breath, chest pain.  Denies any sick contacts. Denies ear pain.  Some headache yesterday, none today.  Patient was seen yesterday for physical, at which time she had complained of nausea and vomiting since Saturday night.  Hadn't had any diarrhea, and nausea/vomiting had mainly been in the mornings.  She has since developed 5-6 episodes of watery diarrhea today.  No blood in stool.  Some abdominal cramps and gas. Denies heartburn, vomiting. Denies travel, recent antibiotics. Denies undercooked or spoiled foods.  Past Medical History  Diagnosis Date  . Dysmenorrhea   . Anemia     No past surgical history on file.  History   Social History  . Marital Status: Single    Spouse Name: N/A    Number of Children: N/A  . Years of Education: N/A   Occupational History  . student    Social History Main Topics  . Smoking status: Never Smoker   . Smokeless tobacco: Never Used  . Alcohol Use: No  . Drug Use: No  . Sexually Active: Not Currently -- Female partner(s)    Birth Control/ Protection: Condom, Pill   Other Topics Concern  . 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 Lives in student housing with sister and roommate    Family History  Problem Relation Age of Onset  . Hypertension Mother   . Hypertension Sister     Current outpatient prescriptions:Ferrous Gluconate 325 (36 FE) MG TABS, Take 1 tablet by mouth daily. 1 tablet po daily with orange juice or food, Disp: 30 tablet, Rfl: 2;  Norgestimate-Ethinyl Estradiol Triphasic (ORTHO TRI-CYCLEN, 28,) 0.18/0.215/0.25 MG-35 MCG tablet, Take 1 tablet by mouth daily., Disp: 3 Package, Rfl: 3  No  Known Allergies  ROS: Denies dizziness, urinary complaints, headache.  Denies fevers, skin rash.  See HPI.  PHYSICAL EXAM: BP 124/76  Pulse 76  Temp(Src) 98.2 F (36.8 C) (Oral)  Ht 5\' 6"  (1.676 m)  Wt 205 lb (92.987 kg)  BMI 33.09 kg/m2  LMP 02/23/2011 Pleasant female, who had been resting comfortably, in no distress HEENT: PERRL, EOMI, conjunctiva clear. Nasal mucosa with edema of turbinates, clear drainage.  Sinuses nontender Slight retraction of L TM, R TM normal.  EAC's normal, no erythema.  OP --Some erythema of anterior tonsillar pillars.  No exudates Neck: No lymphadenopathy Heart: regular rate and rhythm without murmur Lungs: clear bilaterally Abdomen:  Mild tenderness LLQ.  No epigastric tenderness.  Normal bowel sounds.  No rebound tenderness or guarding Skin: no rash Psych: normal mood, affect  ASSESSMENT/PLAN: 1. URI (upper respiratory infection)   2. Laryngitis acute   3. Acute gastroenteritis     AGE--supportive measures, BRAT diet, avoid dairy, imodium or pepto bismol prn.  F/u if worsening abdominal pain, fever, blood in stool, dehydration Laryngitis, URI--supportive measures reviewed.  Reviewed signs/symptoms of bacterial infection  F/u prn.

## 2011-03-16 ENCOUNTER — Encounter: Payer: Self-pay | Admitting: Family Medicine

## 2011-08-01 ENCOUNTER — Encounter: Payer: Self-pay | Admitting: Family Medicine

## 2011-08-01 ENCOUNTER — Ambulatory Visit (INDEPENDENT_AMBULATORY_CARE_PROVIDER_SITE_OTHER): Payer: Managed Care, Other (non HMO) | Admitting: Family Medicine

## 2011-08-01 VITALS — BP 100/70 | HR 64 | Temp 98.2°F | Ht 66.0 in | Wt 207.0 lb

## 2011-08-01 DIAGNOSIS — R05 Cough: Secondary | ICD-10-CM

## 2011-08-01 DIAGNOSIS — R059 Cough, unspecified: Secondary | ICD-10-CM

## 2011-08-01 DIAGNOSIS — J069 Acute upper respiratory infection, unspecified: Secondary | ICD-10-CM

## 2011-08-01 MED ORDER — HYDROCOD POLST-CHLORPHEN POLST 10-8 MG/5ML PO LQCR: 5.0000 mL | Freq: Every evening | ORAL | Status: DC | PRN

## 2011-08-01 MED ORDER — AMOXICILLIN 500 MG PO TABS: 1000.0000 mg | ORAL_TABLET | Freq: Two times a day (BID) | ORAL | Status: AC

## 2011-08-01 NOTE — Progress Notes (Signed)
Chief Complaint  Patient presents with  . Nasal Congestion    x 1 week. Has developed cough over the last 3 days.    HPI: Symptoms started a week ago with headache, congestion and runny nose, some facial pain.  Started coughing 3 days ago. Cough is day and night, but wakes her up at night.  Cough is productive of yellow mucus.  Nasal mucus is also yellow-ish white.  Denies fevers.  Had some sore throat 2 days ago, resolved.  Denies sick contacts. Cough is getting worse, but facial pain has improved some in the last few days.  Getting some pain in her lower ribs from coughing.   Took Tylenol Cold and Cough, Dayquil, Nyquil and Delsym.  She doesn't feel like the medications helped at all.   Past Medical History  Diagnosis Date  . Dysmenorrhea   . Anemia    History   Social History  . Marital Status: Single    Spouse Name: N/A    Number of Children: N/A  . Years of Education: N/A   Occupational History  . student    Social History Main Topics  . Smoking status: Never Smoker   . Smokeless tobacco: Never Used  . Alcohol Use: No  . Drug Use: No  . Sexually Active: Not Currently -- Female partner(s)    Birth Control/ Protection: Condom, Pill   Other Topics Concern  . 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 Lives in student housing with sister and roommate    Current Outpatient Prescriptions on File Prior to Visit  Medication Sig Dispense Refill  . Ferrous Gluconate 325 (36 FE) MG TABS Take 1 tablet by mouth daily. 1 tablet po daily with orange juice or food  30 tablet  2  . Norgestimate-Ethinyl Estradiol Triphasic (ORTHO TRI-CYCLEN, 28,) 0.18/0.215/0.25 MG-35 MCG tablet Take 1 tablet by mouth daily.  3 Package  3   No Known Allergies  ROS:  Denies fevers, nausea, vomiting, diarrhea. No skin rashes, joint or muscle pains.  PHYSICAL EXAM: BP 100/70  Pulse 64  Temp 98.2 F (36.8 C) (Oral)  Ht 5\' 6"  (1.676 m)  Wt 207 lb (93.895 kg)  BMI  33.41 kg/m2  LMP 07/28/2011  Well developed, pleasant female in no distress.  Very rare cough HEENT:  PERRL, EOMI, conjunctiva clear.  TM's and EAC's normal.  Nasal mucosa mildly edematous, no purulence.  Sinuses nontender.  OP with mild erythema posteriorly, otherwise normal. Neck: no lymphadenopathy Heart: regular rate and rhythm without murmur Lungs: clear bilaterally with good air movement Skin: no rash Psych normal mood, hygiene and grooming   ASSESSMENT/PLAN:  1. URI (upper respiratory infection)  amoxicillin (AMOXIL) 500 MG tablet  2. Cough  chlorpheniramine-HYDROcodone (TUSSIONEX PENNKINETIC ER) 10-8 MG/5ML LQCR   URI--no evidence of acute bacterial infection.   Sinus rinses, mucinex (plain or DM), tussionex at bedtime. Start antibiotics in 3-4 days if worsening symptoms--must complete full course if started. Drink plenty of fluids

## 2011-08-01 NOTE — Patient Instructions (Signed)
URI--no evidence of acute bacterial infection currently.   Sinus rinses, mucinex (plain or DM) twice daily, tussionex cough syrup at bedtime (remember to use caution driving in morning, as medicine stays in system for 12 hours). Start antibiotics in 3-4 days if worsening symptoms--must complete full course if started. Drink plenty of fluids!

## 2012-07-10 ENCOUNTER — Emergency Department (INDEPENDENT_AMBULATORY_CARE_PROVIDER_SITE_OTHER)
Admission: EM | Admit: 2012-07-10 | Discharge: 2012-07-10 | Disposition: A | Discharge status: Home / Self Care | Payer: Managed Care, Other (non HMO) | Source: Home / Self Care | Attending: Emergency Medicine | Admitting: Emergency Medicine

## 2012-07-10 ENCOUNTER — Encounter (HOSPITAL_COMMUNITY): Payer: Self-pay | Admitting: *Deleted

## 2012-07-10 DIAGNOSIS — J309 Allergic rhinitis, unspecified: Secondary | ICD-10-CM

## 2012-07-10 MED ORDER — FEXOFENADINE-PSEUDOEPHED ER 60-120 MG PO TB12: 1.0000 | ORAL_TABLET | Freq: Two times a day (BID) | ORAL | Status: DC

## 2012-07-10 MED ORDER — HYDROCOD POLST-CHLORPHEN POLST 10-8 MG/5ML PO LQCR: 5.0000 mL | Freq: Two times a day (BID) | ORAL | Status: DC | PRN

## 2012-07-10 NOTE — ED Provider Notes (Signed)
Medical screening examination/treatment/procedure(s) were performed by non-physician practitioner and as supervising physician I was immediately available for consultation/collaboration.  Raynald Blend, MD 07/10/12 [phone]

## 2012-07-10 NOTE — ED Notes (Signed)
C/o cough, runny nose, headache and when she coughs onset last Thur. night.  No earache or sorthroat.

## 2012-07-10 NOTE — ED Provider Notes (Signed)
History     CSN: [id number]  Arrival date & time 07/10/12  1643   First MD Initiated Contact with Patient 07/10/12 1700      No chief complaint on file.   (Consider location/radiation/quality/duration/timing/severity/associated sxs/prior treatment) HPI  22 yo bf presents today with cough, runny nose, nasal congestion x 1 week.  States that nasal drainage is clear.  Nonproductive cough worsening last couple days.  One episode of vomiting after hard cough this morning.  Does have postnasal drainage and sinus pressure.  Denies fever, chills, chest pain, sob, abd pain, vision changes, sore throat.    Past Medical History  Diagnosis Date  . Dysmenorrhea   . Anemia     No past surgical history on file.  Family History  Problem Relation Age of Onset  . Hypertension Mother   . Hypertension Sister     History  Substance Use Topics  . Smoking status: Never Smoker   . Smokeless tobacco: Never Used  . Alcohol Use: No    OB History   Grav Para Term Preterm Abortions TAB SAB Ect Mult Living   0 0 0 0 0 0 0 0 0 0       Review of Systems  Constitutional: Negative.   HENT: Positive for congestion, rhinorrhea and postnasal drip. Negative for ear pain, facial swelling, neck pain and neck stiffness.   Eyes: Negative.   Respiratory: Positive for cough (nonproductive). Negative for chest tightness, shortness of breath and wheezing.   Cardiovascular: Negative.   Gastrointestinal: Positive for vomiting (one episode this morning after she coughed). Negative for abdominal pain, diarrhea and constipation.  Endocrine: Negative.   Musculoskeletal: Negative.   Skin: Negative.   Neurological: Negative.   Psychiatric/Behavioral: Negative.     Allergies  Review of patient's allergies indicates no known allergies.  Home Medications   Current Outpatient Rx  Name  Route  Sig  Dispense  Refill  . chlorpheniramine-HYDROcodone (TUSSIONEX PENNKINETIC ER) 10-8 MG/5ML LQCR   Oral   Take 5 mLs  by mouth at bedtime as needed (cough).   75 mL   0   . chlorpheniramine-HYDROcodone (TUSSIONEX PENNKINETIC ER) 10-8 MG/5ML LQCR   Oral   Take 5 mLs by mouth every 12 (twelve) hours as needed (cough).   115 mL   1   . Ferrous Gluconate 325 (36 FE) MG TABS   Oral   Take 1 tablet by mouth daily. 1 tablet po daily with orange juice or food   30 tablet   2   . fexofenadine-pseudoephedrine (ALLEGRA-D) 60-120 MG per tablet   Oral   Take 1 tablet by mouth every 12 (twelve) hours.   30 tablet   1   . EXPIRED: Norgestimate-Ethinyl Estradiol Triphasic (ORTHO TRI-CYCLEN, 28,) 0.18/0.215/0.25 MG-35 MCG tablet   Oral   Take 1 tablet by mouth daily.   3 Package   3     BP 133/88  Pulse 68  Temp(Src) 98.4 F (36.9 C) (Oral)  Resp 16  SpO2 100%  Physical Exam  Constitutional: She is oriented to person, place, and time. She appears well-developed and well-nourished.  HENT:  Head: Normocephalic.  Right Ear: External ear normal.  Left Ear: External ear normal.  Mouth/Throat: Oropharynx is clear and moist.  Mild maxillary sinus tenderness  Cardiovascular: Normal rate and regular rhythm.   Neurological: She is alert and oriented to person, place, and time.    ED Course  Procedures (including critical care time)  Labs Reviewed - No data  to display No results found.   1. Allergic rhinitis   vs viral rhinitis   MDM  Patient will f/u in clinic 2-4 days if symptoms not improved or worsen.  Treatment plan discussed and she voices understanding.    Meds ordered this encounter  Medications  . chlorpheniramine-HYDROcodone (TUSSIONEX PENNKINETIC ER) 10-8 MG/5ML LQCR    Sig: Take 5 mLs by mouth every 12 (twelve) hours as needed (cough).    Dispense:  115 mL    Refill:  1  . fexofenadine-pseudoephedrine (ALLEGRA-D) 60-120 MG per tablet    Sig: Take 1 tablet by mouth every 12 (twelve) hours.    Dispense:  30 tablet    Refill:  1           Zonia Kief, PA-C 07/10/12  1741

## 2012-08-14 ENCOUNTER — Ambulatory Visit (INDEPENDENT_AMBULATORY_CARE_PROVIDER_SITE_OTHER): Payer: Managed Care, Other (non HMO) | Admitting: Family Medicine

## 2012-08-14 ENCOUNTER — Encounter: Payer: Self-pay | Admitting: Family Medicine

## 2012-08-14 VITALS — BP 122/86 | HR 76 | Ht 66.0 in | Wt 214.0 lb

## 2012-08-14 DIAGNOSIS — N760 Acute vaginitis: Secondary | ICD-10-CM

## 2012-08-14 DIAGNOSIS — L720 Epidermal cyst: Secondary | ICD-10-CM

## 2012-08-14 DIAGNOSIS — N939 Abnormal uterine and vaginal bleeding, unspecified: Secondary | ICD-10-CM

## 2012-08-14 DIAGNOSIS — N898 Other specified noninflammatory disorders of vagina: Secondary | ICD-10-CM

## 2012-08-14 DIAGNOSIS — L723 Sebaceous cyst: Secondary | ICD-10-CM

## 2012-08-14 LAB — POCT WET PREP (WET MOUNT)

## 2012-08-14 NOTE — Progress Notes (Signed)
Chief Complaint  Patient presents with  . Vaginal Bleeding    finshed cycle on 07/29/12 and has had off and on spotting since.   Periods have always been heavy, lasting 5 days.  Periods have been regular over the last 3 months, but irregular in the past.  When irregular, has to do with length that it lasts, still usually comes monthly.  Last period started 7/4, lasted 4 days, but never really finished, has been spotting on/off since.  She has also developed a painful lump L pubic area, which eventually drained some bloody/pus material 2 days ago.  It is smaller, feeling better--sore to touch, but no longer a bump.   She has some vaginal discharge, typical for end of period.  Had some slight itching yesterday after wiping.  Monogamous relationship x 8 years.  Usually uses condoms, but this month didn't use one a couple of times, last unprotected sex was 7/18.  Past Medical History  Diagnosis Date  . Dysmenorrhea   . Anemia    History reviewed. No pertinent past surgical history.  History   Social History  . Marital Status: Single    Spouse Name: N/A    Number of Children: N/A  . Years of Education: N/A   Occupational History  . student    Social History Main Topics  . Smoking status: Never Smoker   . Smokeless tobacco: Never Used  . Alcohol Use: No  . Drug Use: No  . Sexually Active: Yes -- Female partner(s)    Birth Control/ Protection: Condom   Other Topics Concern  . 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 Lives with mother   Current outpatient prescriptions:Ferrous Gluconate 325 (36 FE) MG TABS, Take 1 tablet by mouth daily. 1 tablet po daily with orange juice or food, Disp: 30 tablet, Rfl: 2  No Known Allergies  ROS:  Denies fevers.  Denies pelvic pain.  Denies URI symptoms, chest pain, cough, or shortness of breath.  No dysuria, vomiting, diarrhea or other concerns.  PHYSICAL EXAM: BP 122/86  Pulse 76  Ht 5\' 6"  (1.676 m)  Wt 214  lb (97.07 kg)  BMI 34.56 kg/m2  LMP 07/25/2012 Well developed, pleasant female in no distress  L pubic area--small area of erythema, small pustule, with slight induration.  No active drainage Remainder of external genital exam is normal without lesions.  Cervix is normal, no lesions.  No abnormal discharge.  Moderate amount of white discharge No inguinal lymphadenopathy  Wet prep--few squams, rare RBC.  No clue cells, trich or PMNs KOH--no yeast  Pregnancy test negative  ASSESSMENT/PLAN: Abnormal vaginal bleeding - Plan: POCT urine pregnancy  Vaginitis and vulvovaginitis, unspecified - Plan: POCT Wet Prep (Wet [place]), GC/Chlamydia Probe Amp  EIC (epidermal inclusion cyst) - in pubic area.  spontaneously drained, healing  Discussed contraception, proper condom use, Plan B Discussed that her recent unprotected intercourse was midcycle, and that we cannot r/o early pregnancy at this time.  To repeat pregnancy test if doesn't get period when due in the next 1-1.5 weeks.  Continue to track periods.  Return if continued spotting/irregular periods, pelvic pain, abnormal discharge or other concerns develop

## 2012-08-14 NOTE — Patient Instructions (Signed)
Make sure to use condoms regularly.  Remember to use Plan B if condom is forgotten, broken, etc. Repeat your pregnancy test if you do not get your period when expected next week.  Continue warm compresses to area of recent cyst.  If lump recurs, and doesn't continue to drain spontaneously, return for re-evaluation and possible incision/drainage

## 2012-08-15 LAB — GC/CHLAMYDIA PROBE AMP: CT Probe RNA: NEGATIVE

## 2012-10-10 ENCOUNTER — Encounter (HOSPITAL_COMMUNITY): Payer: Self-pay | Admitting: Emergency Medicine

## 2012-10-10 ENCOUNTER — Emergency Department (HOSPITAL_COMMUNITY)
Admission: EM | Admit: 2012-10-10 | Discharge: 2012-10-10 | Disposition: A | Discharge status: Home / Self Care | Payer: Managed Care, Other (non HMO) | Attending: Emergency Medicine | Admitting: Emergency Medicine

## 2012-10-10 ENCOUNTER — Emergency Department (HOSPITAL_COMMUNITY): Payer: Managed Care, Other (non HMO)

## 2012-10-10 DIAGNOSIS — Y9289 Other specified places as the place of occurrence of the external cause: Secondary | ICD-10-CM | POA: Insufficient documentation

## 2012-10-10 DIAGNOSIS — Z862 Personal history of diseases of the blood and blood-forming organs and certain disorders involving the immune mechanism: Secondary | ICD-10-CM | POA: Insufficient documentation

## 2012-10-10 DIAGNOSIS — Y9389 Activity, other specified: Secondary | ICD-10-CM | POA: Insufficient documentation

## 2012-10-10 DIAGNOSIS — Z8742 Personal history of other diseases of the female genital tract: Secondary | ICD-10-CM | POA: Insufficient documentation

## 2012-10-10 DIAGNOSIS — T148XXA Other injury of unspecified body region, initial encounter: Secondary | ICD-10-CM

## 2012-10-10 DIAGNOSIS — X500XXA Overexertion from strenuous movement or load, initial encounter: Secondary | ICD-10-CM | POA: Insufficient documentation

## 2012-10-10 DIAGNOSIS — S8990XA Unspecified injury of unspecified lower leg, initial encounter: Secondary | ICD-10-CM | POA: Insufficient documentation

## 2012-10-10 DIAGNOSIS — Y990 Civilian activity done for income or pay: Secondary | ICD-10-CM | POA: Insufficient documentation

## 2012-10-10 DIAGNOSIS — M25571 Pain in right ankle and joints of right foot: Secondary | ICD-10-CM

## 2012-10-10 DIAGNOSIS — S81009A Unspecified open wound, unspecified knee, initial encounter: Secondary | ICD-10-CM | POA: Insufficient documentation

## 2012-10-10 NOTE — ED Notes (Signed)
Patient transported to X-ray 

## 2012-10-10 NOTE — ED Notes (Signed)
Patient is resting comfortably,wtih family at bedside.

## 2012-10-10 NOTE — ED Notes (Signed)
Ortho on their way 

## 2012-10-10 NOTE — ED Notes (Signed)
Pt. missed her step and  tripped at work this morning , no LOC / ambulatory , reports pain / swelling at right ankle .

## 2012-10-10 NOTE — Progress Notes (Signed)
Orthopedic Tech Progress Note Patient Details:  Caitlin Duncan [birth year] [id number] Tolerated well Ortho Devices Type of Ortho Device: Crutches;CAM walker Ortho Device/Splint Location: Right Ortho Device/Splint Interventions: Application   Asia R Thompson 10/10/2012, 8:47 AM

## 2012-10-10 NOTE — ED Notes (Signed)
Placed Ice Pack on injured Ankle.

## 2012-10-10 NOTE — ED Provider Notes (Signed)
CSN: [id number]     Arrival date & time 10/10/12  0640 History   First MD Initiated Contact with Patient 10/10/12 [phone]     Chief Complaint  Patient presents with  . Ankle Pain   (Consider location/radiation/quality/duration/timing/severity/associated sxs/prior Treatment) The history is provided by the patient. No language interpreter was used.  Caitlin Duncan is a 22 year old female with past medical history of dysmenorrhea and anemia presenting to emergency department with right ankle pain that started last night when patient missed a step and rolled on her right ankle at approximately 11:15 PM while at work. Patient reports that she is having a constant throbbing sensation localized to the lateral aspect of her right ankle without radiation. Reported that moving and applying pressure to the right ankle makes the pain worse while nothing makes the pain better. Reported that she's been using ice for comfort. Denied any medications used. Denied loss of sensation, numbness, tingling, prior injury. PCP Dr. Susann Givens  Past Medical History  Diagnosis Date  . Dysmenorrhea   . Anemia    History reviewed. No pertinent past surgical history. Family History  Problem Relation Age of Onset  . Hypertension Mother   . Kidney failure Mother     kidney transplant 2014  . Hypertension Sister    History  Substance Use Topics  . Smoking status: Never Smoker   . Smokeless tobacco: Never Used  . Alcohol Use: No   OB History   Grav Para Term Preterm Abortions TAB SAB Ect Mult Living   0 0 0 0 0 0 0 0 0 0      Review of Systems  Musculoskeletal: Positive for arthralgias.  Neurological: Negative for weakness, numbness and headaches.  All other systems reviewed and are negative.    Allergies  Review of patient's allergies indicates no known allergies.  Home Medications  No current outpatient prescriptions on file. BP 158/127  Pulse 82  Temp(Src) 98.8 F (37.1 C) (Oral)  Ht 5\' 5"  (1.651 m)   Wt 195 lb (88.451 kg)  BMI 32.45 kg/m2  SpO2 100%  LMP 09/14/2012 Physical Exam  Nursing note and vitals reviewed. Constitutional: She is oriented to person, place, and time. She appears well-developed and well-nourished. No distress.  Patient sitting comfortably in bed, chatting with friends that are at bedside - ice pack on right ankle.   HENT:  Head: Normocephalic and atraumatic.  Eyes: Pupils are equal, round, and reactive to light.  Neck: Normal range of motion. Neck supple.  Cardiovascular: Normal rate, regular rhythm and normal heart sounds.  Exam reveals no friction rub.   No murmur heard. Pulses:      Radial pulses are 2+ on the right side, and 2+ on the left side.       Dorsalis pedis pulses are 2+ on the right side, and 2+ on the left side.  Pulmonary/Chest: Effort normal and breath sounds normal. No respiratory distress. She has no wheezes. She has no rales.  Musculoskeletal: She exhibits tenderness.       Feet:  Positive swelling noted to the lateral malleolus of the right ankle - beginnings of ecchymosis identified. Pain upon palpation to the lateral aspect of the right foot - mainly anterior and posterior to the right malleolus. Decreased inversion and eversion noted secondary to pain. Dorsiflexion and plantar flexion intact. Full ROM to the right knee and digits of the right foot.   Neurological: She is alert and oriented to person, place, and time. She exhibits normal muscle  tone. Coordination normal.  Sensation intact to right lower extremity with differentiation to sharp and dull touch Strength 5+/5+ to right lower extremity with resistance  Skin: Skin is warm and dry. No rash noted. She is not diaphoretic. No erythema.  Psychiatric: She has a normal mood and affect. Her behavior is normal. Thought content normal.    ED Course  Procedures (including critical care time)  Labs Review Labs Reviewed - No data to display Imaging Review Dg Ankle Complete  Right  10/10/2012   *RADIOLOGY REPORT*  Clinical Data: Ankle pain.  Rolled the right foot with lateral ankle pain.  RIGHT ANKLE - COMPLETE 3+ VIEW  Comparison: None  Findings: Small ossific fragment inferior to the lateral malleolus may represent small avulsion injury.  No evidence for associated acute fractures.  Normal anatomic alignment.  Soft tissue swelling about the lateral malleolus.  IMPRESSION: Small ossific fragment along the inferior aspect of the lateral malleolus may represent sequelae of avulsion injury, age indeterminate.   Original Report Authenticated By: Annia Belt, M.D    MDM   1. Avulsion injury   2. Right ankle pain     Patient presenting to the ED with right ankle pain that started yesterday night when patient missed a step and rolled on her ankle.  Alert and oriented. Mild swelling and beginnings of ecchymosis noted to the lateral aspect of the right ankle, lateral malleolus. Pain upon palpation to the anterior posterior aspect of lateral malleolus and on the lateral malleolus. Full dorsiflexion and plantar flexion, decreased inversion and eversion secondary to pain. Full range of motion to right knee and right digits of the foot. Pulses palpable and strong. Sensation intact. Strength intact. Negative neurological deficits identified. Imaging of the right ankle noted an avulsion injury with indeterminant age. Patient stable, afebrile. Patient placed in cam walker boot due avulsion injury, patient given crutches for comfort. Negative acute, open fractures identified. Patient neurovascularly intact. Discharge patient. Referred patient to orthopedics to be followed up. Discussed with patient to use Tylenol or ibuprofen for pain control. Discussed with patient to rest, ice, elevate. Discussed with patient to closely monitor symptoms and if symptoms are to worsen or change report back to emergency department-strict return instructions given. Patient agreed to plan of care, understood,  all questions answered.  Raymon Mutton, PA-C 10/10/12 [phone]

## 2012-10-12 NOTE — ED Provider Notes (Signed)
Medical screening examination/treatment/procedure(s) were performed by non-physician practitioner and as supervising physician I was immediately available for consultation/collaboration.   Candyce Churn, MD 10/12/12 [phone]

## 2012-10-30 IMAGING — CT CT MAXILLOFACIAL W/O CM
5 of 10 series · 15 of 47 positions shown, 16 images · non-contrast
Comparison: CT of the head performed 05/30/2005

CT HEAD

CLINICAL DATA: Syncope; fell face first onto concrete, with
lacerations and hematoma on the right side of the face.  Headache,
right-sided facial pain, neck pain and nausea.

CT HEAD WITHOUT CONTRAST
CT MAXILLOFACIAL WITHOUT CONTRAST
CT CERVICAL SPINE WITHOUT CONTRAST
TECHNIQUE: Multidetector CT imaging of the head, cervical spine,
and maxillofacial structures were performed using the standard
protocol without intravenous contrast. Multiplanar CT image
reconstructions of the cervical spine and maxillofacial structures
were also generated.

[Series 7: cervical spine · axial · 0.29mm/px · z∈[-302,-207]mm · 3 of 78 slices shown, 4 images]
[im 20/78  brain]
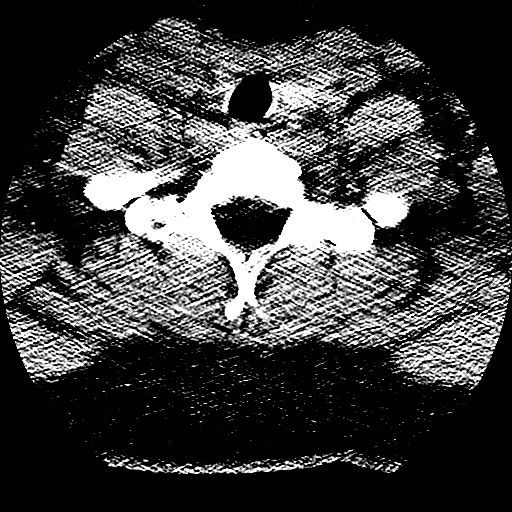
[im 20/78  bone]
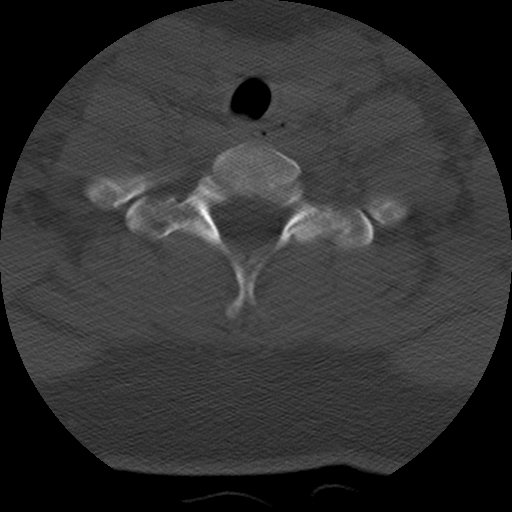
[im 39/78  bone]
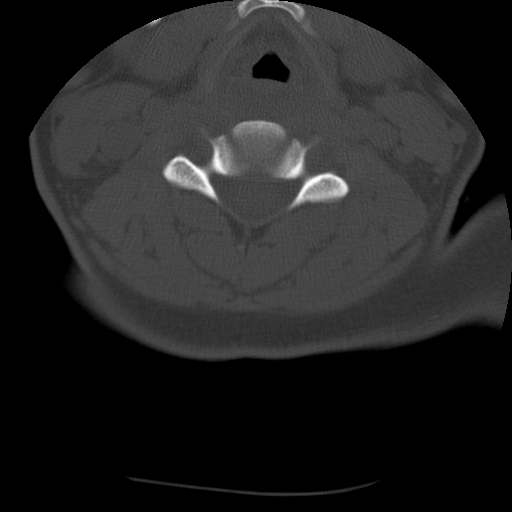
[im 58/78  bone]
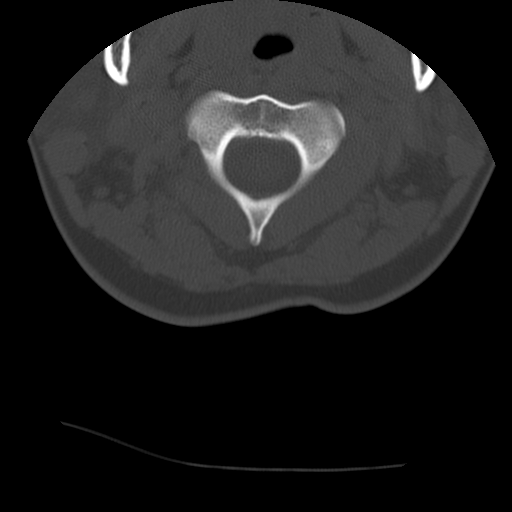

[Series 8: recon 2: cervical spine · axial · 0.29mm/px · z∈[-302,-207]mm · 3 of 78 slices shown]
[im 20/78  bone]
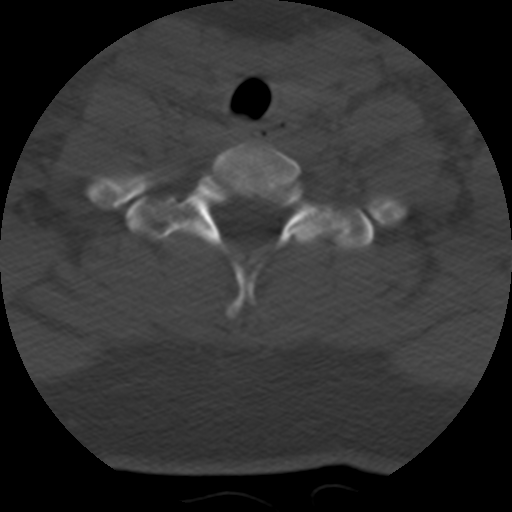
[im 39/78  bone]
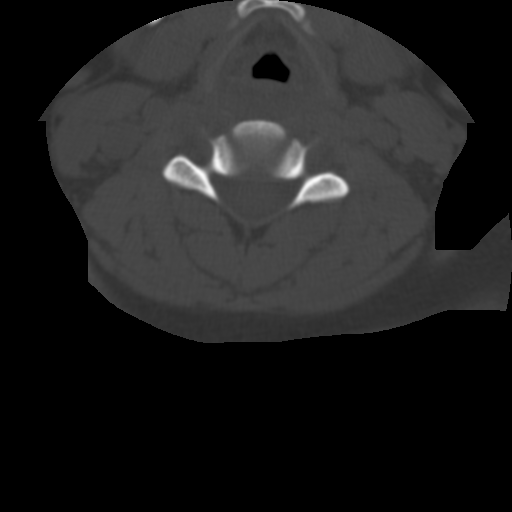
[im 58/78  bone]
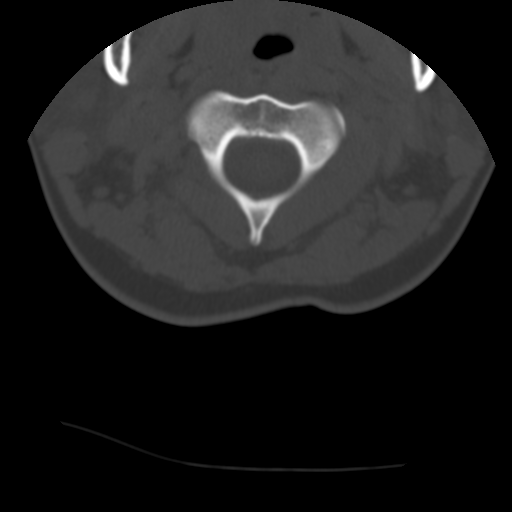

[Series 900: sagittals · sagittal · 0.39mm/px · 3 of 47 slices shown]
[im 16/47  bone]
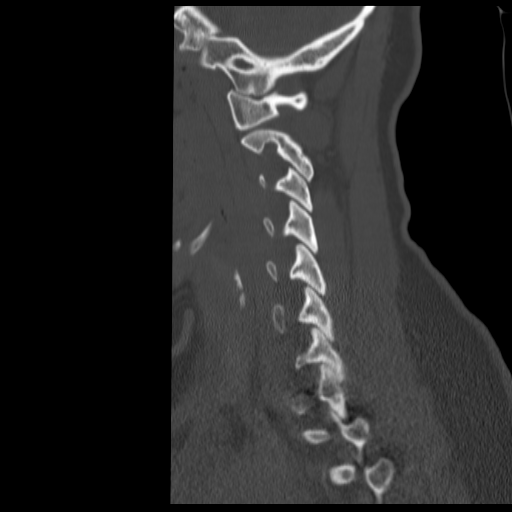
[im 24/47  bone]
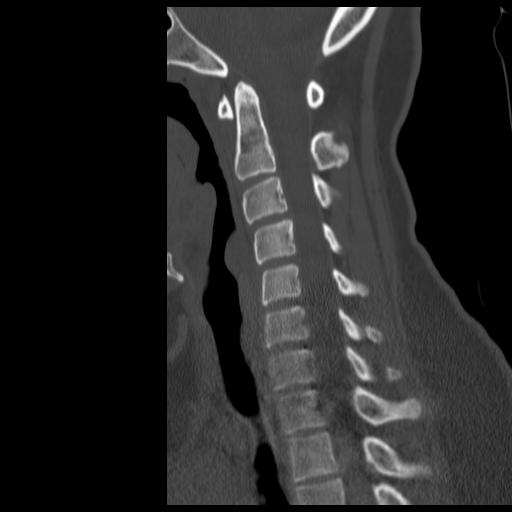
[im 31/47  bone]
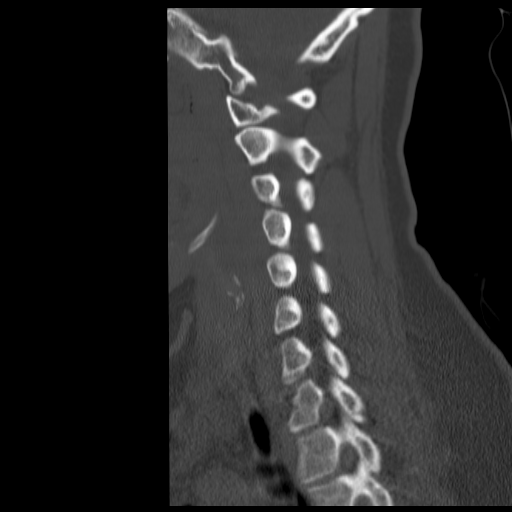

[Series 901: cor · coronal · 0.39mm/px · 3 of 47 slices shown]
[im 16/47  bone]
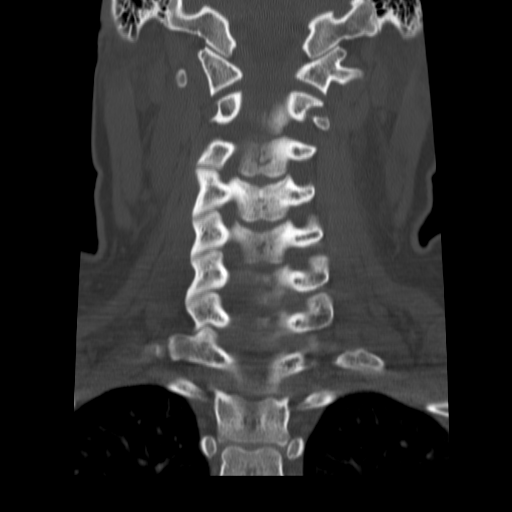
[im 21/47  bone]
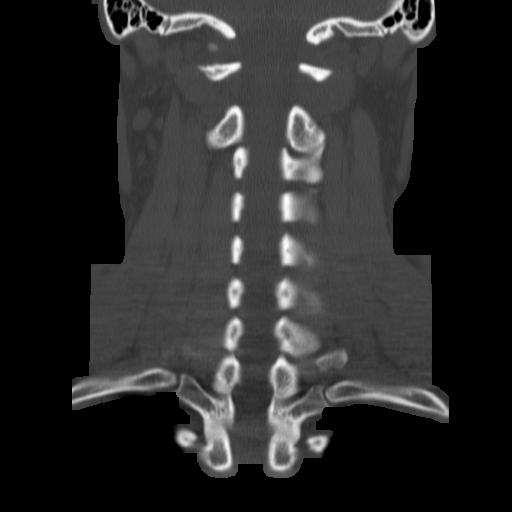
[im 26/47  bone]
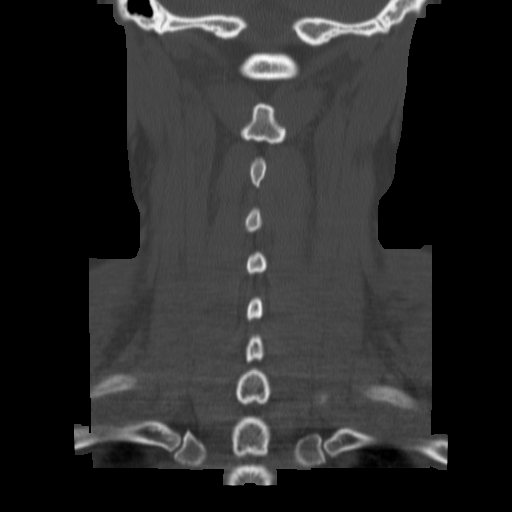

[Series 902: axial · axial · 0.27mm/px · z∈[-312,-225]mm · 3 of 90 slices shown]
[im 23/90  bone]
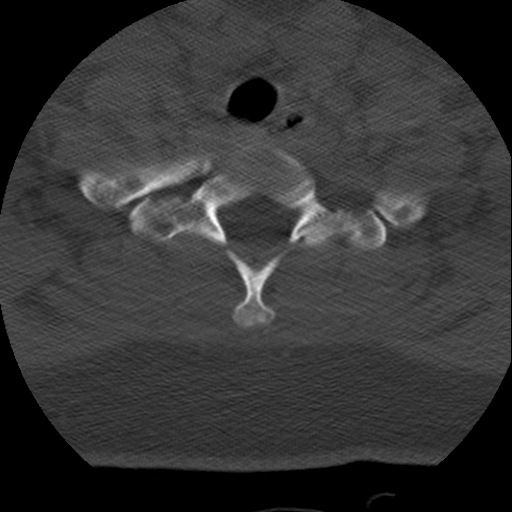
[im 45/90  bone]
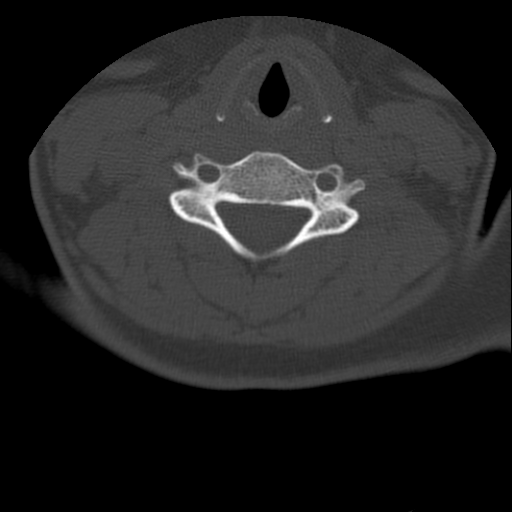
[im 67/90  bone]
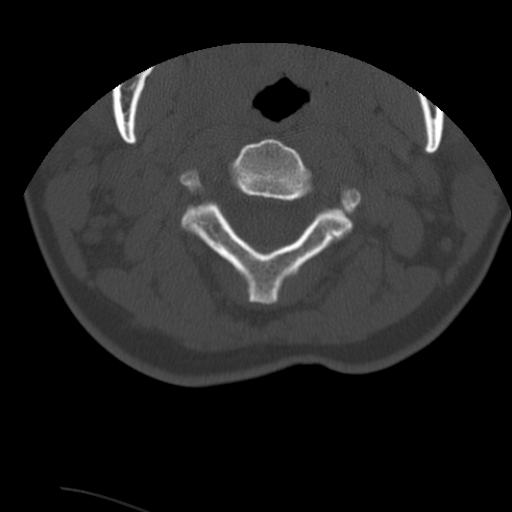

[15 of 47 positions shown; findings below may reference images not displayed]

FINDINGS: There is no evidence of acute infarction, mass lesion, or
intra- or extra-axial hemorrhage on CT.

The posterior fossa, including the cerebellum, brainstem and fourth
ventricle, is within normal limits.  The third and lateral
ventricles, and basal ganglia are unremarkable in appearance.  The
cerebral hemispheres are symmetric in appearance, with normal gray-
white differentiation.  No mass effect or midline shift is seen.

There is no evidence of fracture; visualized osseous structures are
unremarkable in appearance.  The visualized portions of the orbits
are within normal limits.  The paranasal sinuses and mastoid air
cells are well-aerated.  Soft tissue swelling is noted about the
right orbit, and overlying the right frontal calvarium.
IMPRESSION: 1.  No evidence of traumatic intracranial injury or fracture.
2.  Soft tissue swelling about the right orbit, and overlying the
right frontal calvarium.

CT MAXILLOFACIAL
FINDINGS: There is no evidence of fracture or dislocation.  The
maxilla and mandible appear intact.  The nasal bone is unremarkable
in appearance.  The visualized dentition demonstrates no acute
abnormality.

The orbits are intact bilaterally.  The visualized paranasal
sinuses and mastoid air cells are well-aerated.

Soft tissue swelling is noted about the right orbit, and overlying
the right frontal calvarium.  Mild soft tissue abrasions are noted
overlying the right maxilla and mandible.  The parapharyngeal fat
planes are preserved.  The nasopharynx, oropharynx and hypopharynx
are unremarkable in appearance.  The visualized portions of the
valleculae and piriform sinuses are grossly unremarkable.

The parotid and submandibular glands are within normal limits.  No
cervical lymphadenopathy is seen.
IMPRESSION: 1.  No evidence of fracture or dislocation.
2.  Soft tissue swelling noted about the right orbit, and overlying
the right frontal calvarium.
3.  Mild soft tissue abrasions seen overlying the right mandible
and maxilla.

CT CERVICAL SPINE
FINDINGS: There is no evidence of fracture or subluxation.
Flattening of the normal lordotic curvature of the cervical spine
is likely positional in nature.  Vertebral bodies demonstrate
normal height and alignment.  Intervertebral disc spaces are
preserved.  Prevertebral soft tissues are within normal limits.
The visualized neural foramina are grossly unremarkable.

The thyroid gland is unremarkable in appearance.  The visualized
lung apices are clear.  No significant soft tissue abnormalities
are seen.
IMPRESSION: No evidence of fracture or subluxation along the cervical spine.

## 2013-01-20 ENCOUNTER — Encounter: Payer: Self-pay | Admitting: Medical

## 2013-01-20 ENCOUNTER — Ambulatory Visit (INDEPENDENT_AMBULATORY_CARE_PROVIDER_SITE_OTHER): Payer: Managed Care, Other (non HMO) | Admitting: Medical

## 2013-01-20 VITALS — BP 138/78 | Temp 98.0°F | Wt 231.0 lb

## 2013-01-20 DIAGNOSIS — N926 Irregular menstruation, unspecified: Secondary | ICD-10-CM

## 2013-01-20 DIAGNOSIS — Z3201 Encounter for pregnancy test, result positive: Secondary | ICD-10-CM

## 2013-01-20 MED ORDER — GNP PRENATAL VITAMINS 28-0.8 MG PO TABS: 1.0000 | ORAL_TABLET | Freq: Every day | ORAL | Status: DC

## 2013-01-20 NOTE — Progress Notes (Signed)
Subjective: Here for possible pregnancy.   LMP 12/05/12.  +home pregnancy test yesterday.  Denies breast tenderness, nausea, no weight changes, but does note some fatigue.  She notes no prior pregnancy.  Last birth control about 2 years ago.  Not currently on prenatal vitamins.  She wants to proceed with pregnancy if +.  No other c/o.  Her boyfriend is here and has no questions or concerns.    Past Medical History  Diagnosis Date  . Dysmenorrhea   . Anemia    ROS as in subjective  Objective: Gen.: Well-built, well-nourished, no acute distress Psych: Pleasant, good eye contact answers questions appropriately  Assessment: Encounter Diagnoses  Name Primary?  . Positive pregnancy test Yes  . Missed period    Plan: Urine pregnancy positive, we will check a beta-hCG.  We will refer her to obstetrician, start prenatal vitamin. Discussed the do's and don't's regarding over-the-counter medications foods, discussed general hygiene, diet, and exercise/activity.  Answered her questions. Of note, boyfriend wasn't very vocal in giving or receiving info today.  She declined to talk with me alone.   She seems happy and somewhat excited about the possibility of having a child.  Counseled on next steps.  F/u with OB/pending labs.

## 2013-01-20 NOTE — Patient Instructions (Signed)

## 2013-01-28 ENCOUNTER — Emergency Department (HOSPITAL_COMMUNITY)
Admission: EM | Admit: 2013-01-28 | Discharge: 2013-01-28 | Disposition: A | Discharge status: Home / Self Care | Payer: Managed Care, Other (non HMO) | Attending: Emergency Medicine | Admitting: Emergency Medicine

## 2013-01-28 ENCOUNTER — Encounter (HOSPITAL_COMMUNITY): Payer: Self-pay | Admitting: Emergency Medicine

## 2013-01-28 DIAGNOSIS — L259 Unspecified contact dermatitis, unspecified cause: Secondary | ICD-10-CM

## 2013-01-28 DIAGNOSIS — Z8742 Personal history of other diseases of the female genital tract: Secondary | ICD-10-CM | POA: Insufficient documentation

## 2013-01-28 DIAGNOSIS — O9989 Other specified diseases and conditions complicating pregnancy, childbirth and the puerperium: Secondary | ICD-10-CM | POA: Insufficient documentation

## 2013-01-28 DIAGNOSIS — Z862 Personal history of diseases of the blood and blood-forming organs and certain disorders involving the immune mechanism: Secondary | ICD-10-CM | POA: Insufficient documentation

## 2013-01-28 MED ORDER — DIPHENHYDRAMINE HCL 25 MG PO CAPS
25.0000 mg | ORAL_CAPSULE | Freq: Once | ORAL | Status: AC
  Administered 2013-01-28: 25 mg via ORAL
  Filled 2013-01-28: qty 1

## 2013-01-28 MED ORDER — FAMOTIDINE 20 MG PO TABS
20.0000 mg | ORAL_TABLET | Freq: Once | ORAL | Status: AC
  Administered 2013-01-28: 20 mg via ORAL
  Filled 2013-01-28: qty 1

## 2013-01-28 NOTE — ED Notes (Signed)
Patient with hives on upper arms, thighs, chest and back.  Patient woke from sleep due to the itching.  Patient is 6-[redacted] weeks pregnant.  Patient is CAOx3, no respiratory involvement.

## 2013-01-28 NOTE — ED Notes (Signed)
Patient walked past first asked if she was discharged for her paperwork for registration. Pt states that the doctor was taking to long and continued to walk out.

## 2013-01-28 NOTE — ED Notes (Signed)
Patient left ED without discharge paperwork

## 2013-01-28 NOTE — ED Provider Notes (Signed)
CSN: [id number]     Arrival date & time 01/28/13  0417 History   First MD Initiated Contact with Patient 01/28/13 [phone]     Chief Complaint  Patient presents with  . Rash   (Consider location/radiation/quality/duration/timing/severity/associated sxs/prior Treatment) Patient is a 23 y.o. female presenting with rash. The history is provided by the patient. No language interpreter was used.  Rash Location:  Shoulder/arm Shoulder/arm rash location:  L arm and R arm Quality: itchiness   Quality: not scaling   Severity:  Moderate Onset quality:  Sudden Timing:  Constant Progression:  Unchanged Chronicity:  New Context: not animal contact, not chemical exposure, not eggs, not exposure to similar rash, not hot tub use and not plant contact     Past Medical History  Diagnosis Date  . Dysmenorrhea   . Anemia    History reviewed. No pertinent past surgical history. Family History  Problem Relation Age of Onset  . Hypertension Mother   . Kidney failure Mother     kidney transplant 2014  . Hypertension Sister    History  Substance Use Topics  . Smoking status: Never Smoker   . Smokeless tobacco: Never Used  . Alcohol Use: No   OB History   Grav Para Term Preterm Abortions TAB SAB Ect Mult Living   1 0 0 0 0 0 0 0 0 0      Review of Systems  Skin: Positive for rash.  All other systems reviewed and are negative.    Allergies  Review of patient's allergies indicates no known allergies.  Home Medications  No current outpatient prescriptions on file. BP 117/70  Pulse 87  Temp(Src) 97.9 F (36.6 C) (Oral)  Resp 20  SpO2 99%  LMP 12/10/2012 Physical Exam  Constitutional: She is oriented to person, place, and time. She appears well-developed and well-nourished. No distress.  HENT:  Head: Normocephalic and atraumatic.  Mouth/Throat: Oropharynx is clear and moist.  No swelling of the lips tongue or uvula  Eyes: EOM are normal. Pupils are equal, round, and reactive to  light.  Neck: Normal range of motion. Neck supple.  Cardiovascular: Normal rate, regular rhythm and intact distal pulses.   Pulmonary/Chest: Effort normal and breath sounds normal. No stridor. She has no wheezes. She has no rales.  Abdominal: Soft. Bowel sounds are normal. There is no tenderness. There is no rebound and no guarding.  Musculoskeletal: Normal range of motion.  Neurological: She is alert and oriented to person, place, and time.  Skin: Skin is warm and dry. No rash noted.  Psychiatric: She has a normal mood and affect.    ED Course  Procedures (including critical care time) Labs Review Labs Reviewed - No data to display Imaging Review No results found.  EKG Interpretation   None       MDM   1. Contact dermatitis    Benadryl follow up with your obstetrician   Taelynn Mcelhannon K Hasani Diemer-Rasch, MD 01/28/13 [phone]

## 2013-02-25 ENCOUNTER — Encounter: Payer: Managed Care, Other (non HMO) | Admitting: Obstetrics

## 2013-03-22 DIAGNOSIS — E559 Vitamin D deficiency, unspecified: Secondary | ICD-10-CM

## 2013-03-22 DIAGNOSIS — IMO0002 Reserved for concepts with insufficient information to code with codable children: Secondary | ICD-10-CM

## 2013-03-22 HISTORY — DX: Vitamin D deficiency, unspecified: E55.9

## 2013-03-22 HISTORY — DX: Reserved for concepts with insufficient information to code with codable children: IMO0002

## 2013-04-13 ENCOUNTER — Encounter: Payer: Self-pay | Admitting: Family Medicine

## 2013-04-13 ENCOUNTER — Other Ambulatory Visit (HOSPITAL_COMMUNITY)
Admission: RE | Admit: 2013-04-13 | Discharge: 2013-04-13 | Disposition: A | Discharge status: Home / Self Care | Payer: Managed Care, Other (non HMO) | Source: Ambulatory Visit | Attending: Family Medicine | Admitting: Family Medicine

## 2013-04-13 ENCOUNTER — Ambulatory Visit (INDEPENDENT_AMBULATORY_CARE_PROVIDER_SITE_OTHER): Payer: Managed Care, Other (non HMO) | Admitting: Family Medicine

## 2013-04-13 VITALS — BP 126/76 | HR 80 | Ht 66.0 in | Wt 229.0 lb

## 2013-04-13 DIAGNOSIS — D649 Anemia, unspecified: Secondary | ICD-10-CM

## 2013-04-13 DIAGNOSIS — Z309 Encounter for contraceptive management, unspecified: Secondary | ICD-10-CM | POA: Insufficient documentation

## 2013-04-13 DIAGNOSIS — R635 Abnormal weight gain: Secondary | ICD-10-CM

## 2013-04-13 DIAGNOSIS — R5383 Other fatigue: Secondary | ICD-10-CM

## 2013-04-13 DIAGNOSIS — Z0000 Encounter for general adult medical examination without abnormal findings: Secondary | ICD-10-CM

## 2013-04-13 DIAGNOSIS — Z1151 Encounter for screening for human papillomavirus (HPV): Secondary | ICD-10-CM | POA: Insufficient documentation

## 2013-04-13 DIAGNOSIS — Z124 Encounter for screening for malignant neoplasm of cervix: Secondary | ICD-10-CM | POA: Insufficient documentation

## 2013-04-13 DIAGNOSIS — N946 Dysmenorrhea, unspecified: Secondary | ICD-10-CM

## 2013-04-13 DIAGNOSIS — R87810 Cervical high risk human papillomavirus (HPV) DNA test positive: Secondary | ICD-10-CM | POA: Insufficient documentation

## 2013-04-13 DIAGNOSIS — E669 Obesity, unspecified: Secondary | ICD-10-CM | POA: Insufficient documentation

## 2013-04-13 DIAGNOSIS — Z113 Encounter for screening for infections with a predominantly sexual mode of transmission: Secondary | ICD-10-CM | POA: Insufficient documentation

## 2013-04-13 DIAGNOSIS — R5381 Other malaise: Secondary | ICD-10-CM

## 2013-04-13 DIAGNOSIS — Z23 Encounter for immunization: Secondary | ICD-10-CM

## 2013-04-13 DIAGNOSIS — N920 Excessive and frequent menstruation with regular cycle: Secondary | ICD-10-CM

## 2013-04-13 LAB — POCT URINALYSIS DIPSTICK
BILIRUBIN UA: NEGATIVE
Glucose, UA: NEGATIVE
Ketones, UA: NEGATIVE
Leukocytes, UA: NEGATIVE
NITRITE UA: NEGATIVE
PH UA: 7
Protein, UA: NEGATIVE
RBC UA: NEGATIVE
SPEC GRAV UA: 1.015
Urobilinogen, UA: NEGATIVE

## 2013-04-13 MED ORDER — NORGESTIM-ETH ESTRAD TRIPHASIC 0.18/0.215/0.25 MG-35 MCG PO TABS: 1.0000 | ORAL_TABLET | Freq: Every day | ORAL | Status: DC

## 2013-04-13 NOTE — Patient Instructions (Signed)
  HEALTH MAINTENANCE RECOMMENDATIONS:  It is recommended that you get at least 30 minutes of aerobic exercise at least 5 days/week (for weight loss, you may need as much as 60-90 minutes). This can be any activity that gets your heart rate up. This can be divided in 10-15 minute intervals if needed, but try and build up your endurance at least once a week.  Weight bearing exercise is also recommended twice weekly.  Eat a healthy diet with lots of vegetables, fruits and fiber.  "Colorful" foods have a lot of vitamins (ie green vegetables, tomatoes, red peppers, etc).  Limit sweet tea, regular sodas and alcoholic beverages, all of which has a lot of calories and sugar.  Up to 1 alcoholic drink daily may be beneficial for women (unless trying to lose weight, watch sugars).  Drink a lot of water.  Calcium recommendations are 1200-1500 mg daily (1500 mg for postmenopausal women or women without ovaries), and vitamin D 1000 IU daily.  This should be obtained from diet and/or supplements (vitamins), and calcium should not be taken all at once, but in divided doses.  Monthly self breast exams and yearly mammograms for women over the age of 23 is recommended.  Sunscreen of at least SPF 30 should be used on all sun-exposed parts of the skin when outside between the hours of 10 am and 4 pm (not just when at beach or pool, but even with exercise, golf, tennis, and yard work!)  Use a sunscreen that says "broad spectrum" so it covers both UVA and UVB rays, and make sure to reapply every 1-2 hours.  Remember to change the batteries in your smoke detectors when changing your clock times in the spring and fall.  Use your seat belt every time you are in a car, and please drive safely and not be distracted with cell phones and texting while driving.  Start the birth control pills the Sunday after your next period starts.  Remember to use condoms regularly until then, and for the entire first month.  Use condoms also if  you are ever prescribed antibiotics, or if you miss more than 1 day of pills.

## 2013-04-13 NOTE — Progress Notes (Signed)
Chief Complaint  Patient presents with  . Annual Exam    fasting annual exam with pap. Would like to get back on OCP's. Also has experienced some vaginal burning and itching about a week ago that has resolved.    Caitlin Duncan is a 23 y.o. female who presents for a complete physical.  She has the following concerns:  Contraceptive management--desires OCP prescription.  She previously took OCPs (ortho tricyclen) for dysmenorrhea and heavy periods, with good control of her symptoms.  She hadn't been on OCP's for a while.  She recently got pregnant, had termination in January.  She was given OCP's x 2 months after procedure (lo lo-estrin), but ran out 3 weeks ago.  Would like to get back on OCP's.    She hadn't been on pills prior to the pregnancy, and periods were heavy, but not particularly painful (mild cramps and headaches).  She is currently with the same partner x 2 years.  Immunization History  Administered Date(s) Administered  . HPV Quadrivalent 03/13/2010, 06/06/2010, 03/14/2011   Last Pap smear: 02/2011 Last mammogram: never  Last colonoscopy: never  Last DEXA: never  Dentist: yearly (twice this past year) Ophtho: yearly, wears glasses/contacts Exercise: none recently.  Used to walk, treadmill, crunches, and other exercises twice a week  Past Medical History  Diagnosis Date  . Dysmenorrhea   . Anemia     History reviewed. No pertinent past surgical history.  History   Social History  . Marital Status: Single    Spouse Name: N/A    Number of Children: N/A  . Years of Education: N/A   Occupational History  . student    Social History Main Topics  . Smoking status: Never Smoker   . Smokeless tobacco: Never Used  . Alcohol Use: No  . Drug Use: No  . Sexual Activity: Yes    Partners: Male    Birth Control/ Protection: Condom   Other Topics Concern  . 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  Works  at [employer]    Family History  Problem Relation Age of Onset  . Hypertension Mother   . Kidney failure Mother     kidney transplant 2014  . Hypertension Sister   . Diabetes Neg Hx   . Cancer Neg Hx   . Heart disease Neg Hx    Outpatient Encounter Prescriptions as of 04/13/2013  Medication Sig Note  . Multiple Vitamin (MULTIVITAMIN) tablet Take 1 tablet by mouth daily.   . Norgestimate-Ethinyl Estradiol Triphasic (ORTHO TRI-CYCLEN, 28,) 0.18/0.215/0.25 MG-35 MCG tablet Take 1 tablet by mouth daily.   . [DISCONTINUED] LO LOESTRIN FE 1 MG-10 MCG / 10 MCG tablet  04/13/2013: Given 2 months after abortion; ran out 3 weeks ago  . [DISCONTINUED] Norgestimate-Ethinyl Estradiol Triphasic (ORTHO TRI-CYCLEN, 28,) 0.18/0.215/0.25 MG-35 MCG tablet Take 1 tablet by mouth daily.    Prior to today's visit she wasn't on OCP's, recently finished lo lo-estrin; NOT on OTC, which was rx'd today)  No Known Allergies  ROS: The patient denies anorexia, fever, headaches, vision changes, decreased hearing, ear pain, sore throat, breast concerns, chest pain, palpitations, dizziness, syncope, dyspnea on exertion, cough, swelling, diarrhea, constipation, abdominal pain, melena, hematochezia, hematuria, incontinence, dysuria, irregular menstrual cycles, genital lesions, joint pains, numbness, tingling, weakness, tremor, suspicious skin lesions, depression, anxiety, abnormal bleeding/bruising, or enlarged lymph nodes.  Gained 10-20 pounds in the last year.  She wasn't able to  exercise much, worked 3rd shift and ate late at night.  Last week had some vaginal itching/burning, but it resolved. Currently denies any discharge, itch or discomfort.  PHYSICAL EXAM:  BP 126/76  Pulse 80  Ht 5\' 6"  (1.676 m)  Wt 229 lb (103.874 kg)  BMI 36.98 kg/m2  LMP 03/28/2012  Breastfeeding? No  General Appearance:  Alert, cooperative, no distress, appears stated age   Head:  Normocephalic, without obvious abnormality, atraumatic    Eyes:  PERRL, conjunctiva/corneas clear, EOM's intact, fundi  benign   Ears:  Normal TM's and external ear canals   Nose:  Nares normal, with piercing at right nostril, mucosa is moderately edematous, no drainage or sinus tenderness   Throat:  Lips, mucosa, and tongue normal; teeth and gums normal.  Scar on central portion of tongue  Neck:  Supple, no lymphadenopathy; thyroid: no enlargement/tenderness/nodules; no carotid  bruit or JVD   Back:  Spine nontender, no curvature, ROM normal, no CVA tenderness   Lungs:  Clear to auscultation bilaterally without wheezes, rales or ronchi; respirations unlabored   Chest Wall:  No tenderness or deformity   Heart:  Regular rate and rhythm, S1 and S2 normal, no murmur, rub  or gallop   Breast Exam:  No tenderness, masses, or nipple discharge or inversion. No axillary lymphadenopathy. Significant fibrocystic changes in upper outer quadrants bilaterally. No discrete mass   Abdomen:  Soft, non-tender, nondistended, normoactive bowel sounds,  no masses, no hepatosplenomegaly   Genitalia:  Normal external genitalia without lesions. BUS and vagina normal; cervix without lesions, or cervical motion tenderness. No abnormal vaginal discharge. Uterus and adnexa not enlarged, nontender, no masses. Pap performed   Rectal:  Not performed due to age<40 and no related complaints   Extremities:  No clubbing, cyanosis or edema   Pulses:  2+ and symmetric all extremities   Skin:  Skin color, texture, turgor normal, no rashes or lesions. Acanthosis nigricans around neck  Lymph nodes:  Cervical, supraclavicular, and axillary nodes normal   Neurologic:  CNII-XII intact, normal strength, sensation and gait; reflexes 2+ and symmetric throughout   Psych: Normal mood, affect, hygiene and grooming.   ASSESSMENT/PLAN:  Routine general medical examination at a health care facility - Plan: POCT Urinalysis Dipstick, Visual acuity screening, CBC with Differential, Vit D  25  hydroxy (rtn osteoporosis monitoring), TSH, Glucose, random, Ferritin, Cytology - PAP [place]  Dysmenorrhea - Plan: Norgestimate-Ethinyl Estradiol Triphasic (ORTHO TRI-CYCLEN, 28,) 0.18/0.215/0.25 MG-35 MCG tablet, DISCONTINUED: Norgestimate-Ethinyl Estradiol Triphasic (ORTHO TRI-CYCLEN, 28,) 0.18/0.215/0.25 MG-35 MCG tablet  Heavy periods - Plan: CBC with Differential  Anemia - Plan: CBC with Differential, Ferritin  Need for Tdap vaccination - Plan: Tdap vaccine greater than or equal to 7yo IM  Other malaise and fatigue - Plan: Vit D  25 hydroxy (rtn osteoporosis monitoring), TSH  Weight gain - Plan: TSH  Unspecified contraceptive management - Plan: Norgestimate-Ethinyl Estradiol Triphasic (ORTHO TRI-CYCLEN, 28,) 0.18/0.215/0.25 MG-35 MCG tablet, DISCONTINUED: Norgestimate-Ethinyl Estradiol Triphasic (ORTHO TRI-CYCLEN, 28,) 0.18/0.215/0.25 MG-35 MCG tablet  Obesity (BMI 30-39.9)  Risks/side effects and how to start/use OCP's reviewed in detail.  Discussed monthly self breast exams and yearly mammograms after the age of 15; at least 30 minutes of aerobic activity at least 5 days/week; proper sunscreen use reviewed; healthy diet, including goals of calcium and vitamin D intake and alcohol recommendations (less than or equal to 1 drink/day) reviewed; regular seatbelt use; changing batteries in smoke detectors.  Immunization recommendations discussed--TdaP today, flu shots recommended yearly  F/u 1 year, sooner prn

## 2013-04-14 ENCOUNTER — Encounter: Payer: Self-pay | Admitting: Family Medicine

## 2013-04-14 DIAGNOSIS — E559 Vitamin D deficiency, unspecified: Secondary | ICD-10-CM | POA: Insufficient documentation

## 2013-04-14 LAB — CBC WITH DIFFERENTIAL/PLATELET
BASOS PCT: 0 % (ref 0–1)
Basophils Absolute: 0 10*3/uL (ref 0.0–0.1)
Eosinophils Absolute: 0 10*3/uL (ref 0.0–0.7)
Eosinophils Relative: 1 % (ref 0–5)
HCT: 39.1 % (ref 36.0–46.0)
Hemoglobin: 12.8 g/dL (ref 12.0–15.0)
Lymphocytes Relative: 49 % — ABNORMAL HIGH (ref 12–46)
Lymphs Abs: 2.3 10*3/uL (ref 0.7–4.0)
MCH: 27.4 pg (ref 26.0–34.0)
MCHC: 32.7 g/dL (ref 30.0–36.0)
MCV: 83.7 fL (ref 78.0–100.0)
MONO ABS: 0.5 10*3/uL (ref 0.1–1.0)
Monocytes Relative: 11 % (ref 3–12)
NEUTROS PCT: 39 % — AB (ref 43–77)
Neutro Abs: 1.8 10*3/uL (ref 1.7–7.7)
Platelets: 199 10*3/uL (ref 150–400)
RBC: 4.67 MIL/uL (ref 3.87–5.11)
RDW: 15.8 % — ABNORMAL HIGH (ref 11.5–15.5)
WBC: 4.7 10*3/uL (ref 4.0–10.5)

## 2013-04-14 LAB — VITAMIN D 25 HYDROXY (VIT D DEFICIENCY, FRACTURES): VIT D 25 HYDROXY: 16 ng/mL — AB (ref 30–89)

## 2013-04-14 LAB — GLUCOSE, RANDOM: Glucose, Bld: 82 mg/dL (ref 70–99)

## 2013-04-14 LAB — FERRITIN: FERRITIN: 9 ng/mL — AB (ref 10–291)

## 2013-04-14 LAB — TSH: TSH: 1.303 u[IU]/mL (ref 0.350–4.500)

## 2013-04-15 ENCOUNTER — Other Ambulatory Visit: Payer: Self-pay | Admitting: *Deleted

## 2013-04-15 MED ORDER — VITAMIN D (ERGOCALCIFEROL) 1.25 MG (50000 UNIT) PO CAPS: 50000.0000 [IU] | ORAL_CAPSULE | ORAL | Status: DC

## 2013-04-30 ENCOUNTER — Encounter: Payer: Self-pay | Admitting: Family Medicine

## 2013-04-30 DIAGNOSIS — IMO0002 Reserved for concepts with insufficient information to code with codable children: Secondary | ICD-10-CM | POA: Insufficient documentation

## 2013-05-01 ENCOUNTER — Other Ambulatory Visit: Payer: Self-pay | Admitting: *Deleted

## 2013-05-01 MED ORDER — FLUCONAZOLE 150 MG PO TABS: 150.0000 mg | ORAL_TABLET | Freq: Every day | ORAL | Status: DC

## 2013-06-22 DIAGNOSIS — A749 Chlamydial infection, unspecified: Secondary | ICD-10-CM

## 2013-06-22 HISTORY — DX: Chlamydial infection, unspecified: A74.9

## 2013-07-16 ENCOUNTER — Encounter: Payer: Self-pay | Admitting: Family Medicine

## 2013-07-16 ENCOUNTER — Ambulatory Visit (INDEPENDENT_AMBULATORY_CARE_PROVIDER_SITE_OTHER): Payer: Managed Care, Other (non HMO) | Admitting: Family Medicine

## 2013-07-16 VITALS — BP 120/86 | HR 72 | Ht 66.0 in | Wt 228.0 lb

## 2013-07-16 DIAGNOSIS — N939 Abnormal uterine and vaginal bleeding, unspecified: Secondary | ICD-10-CM

## 2013-07-16 DIAGNOSIS — N898 Other specified noninflammatory disorders of vagina: Secondary | ICD-10-CM

## 2013-07-16 DIAGNOSIS — R6889 Other general symptoms and signs: Secondary | ICD-10-CM

## 2013-07-16 DIAGNOSIS — IMO0002 Reserved for concepts with insufficient information to code with codable children: Secondary | ICD-10-CM

## 2013-07-16 NOTE — Progress Notes (Signed)
Chief Complaint  Patient presents with  . Advice Only    is having breakthrough bleeding 2-4 times per month.    She was restarted on ortho tricyclen after her last physical the end of March.  She had no problems during the first pack.  On the second pack she had abnormal bleeding during the second week of pills.  She bled like a regular period (5 days) during the 2nd week of the pack, and bled again when she was on the placebo pills.  She is currently on her third pack, and had spotting on the third week of the pack.  She spotted x 2 days only, the first of which was heavier spotting (but still spotting, not heavy flow).  Second day was lighter spotting.  She is due to start her period within the next couple of days (currently on the 4th week of pills).  She missed a pill only once (on Friday, took two the next day, but this was after she had already spotted).  She denies any side effects of OCP's--no nausea, headaches.  She is in a sexual relationship, and only uses condoms about half the time.  She denies any abnormal vaginal discharge, but is concerned about the possibility of chlamydia.  Her partner is asymptomatic.  She is worried mainly because she read that chlamydia can cause abnormal bleeding, and not have a lot of symptoms.  Last pap smear was 03/2013--pap smear results showed ASCUS and high risk HPV, as well as a yeast infection.  She took diflucan 150mg  x 1 dose. The current guidelines for age 65-24 with ASCUS and high risk HPV is to repeat pap in 12 months  Past Medical History  Diagnosis Date  . Dysmenorrhea   . Anemia    History reviewed. No pertinent past surgical history. History   Social History  . Marital Status: Single    Spouse Name: N/A    Number of Children: N/A  . Years of Education: N/A   Occupational History  . student    Social History Main Topics  . Smoking status: Never Smoker   . Smokeless tobacco: Never Used  . Alcohol Use: No  . Drug Use: No  . Sexual  Activity: Yes    Partners: Male    Birth Control/ Protection: Condom   Other Topics Concern  . 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  Works at [employer]   Outpatient Encounter Prescriptions as of 07/16/2013  Medication Sig  . Multiple Vitamin (MULTIVITAMIN) tablet Take 1 tablet by mouth daily.  . Norgestimate-Ethinyl Estradiol Triphasic (ORTHO TRI-CYCLEN, 28,) 0.18/0.215/0.25 MG-35 MCG tablet Take 1 tablet by mouth daily.  . Vitamin D, Ergocalciferol, (DRISDOL) 50000 UNITS CAPS capsule Take 1 capsule (50,000 Units total) by mouth every 7 (seven) days.  . [DISCONTINUED] fluconazole (DIFLUCAN) 150 MG tablet Take 1 tablet (150 mg total) by mouth daily.  (no longer taking Vitamin D rx)  No Known Allergies  ROS:  Denies fevers, chills, nausea, vomiting, diarrhea, headaches, dizziness, chest pain.  Denies vaginal discharge, dysuria or other urinary complaints. Denies any bleeding (other than vaginal as per HPI), no bruising, rashes or other concerns.  PHYSICAL EXAM:  BP 120/86  Pulse 72  Ht 5\' 6"  (1.676 m)  Wt 228 lb (103.42 kg)  BMI 36.82 kg/m2  LMP 06/19/2013 Well developed, pleasant, obese female in no distress External genitalia is normal.  Cervix appears normal.  Small  amount of thin white mucus is present in vaginal vault and at cervix.  Cervix is not friable.  ASSESSMENT/PLAN:  Abnormal vaginal bleeding - suspect that it is related to OCP's. on 3rd pack, inconsistent pattern of bleeding.  Continue to monitor.  Avoid missing pills.  Use condoms - Plan: GC/Chlamydia Probe Amp  ASCUS with positive high risk HPV - discussed results, need for pap in 03/2014.  Cervix appears normal without any suspicious lesions as cause for bleeding  Safe sex practices reviewed, encouraged regular condom use.  Keep calendar of cycles and abnormal bleeding.  Return/contact us if abnormal bleeding persists (not related to missed pills, and to  keep track of when bleeding is occurring).

## 2013-07-16 NOTE — Patient Instructions (Signed)
Continue to take your pills--try not to miss any. Document your bleeding on calendar, and contact us if you continue to have abnormal bleeding (and let us know when in the cycle of the pill pack the abnormal bleeding occurred).

## 2013-07-17 ENCOUNTER — Encounter: Payer: Self-pay | Admitting: Family Medicine

## 2013-07-17 LAB — GC/CHLAMYDIA PROBE AMP
CT PROBE, AMP APTIMA: POSITIVE — AB
GC Probe RNA: NEGATIVE

## 2013-07-17 MED ORDER — AZITHROMYCIN 500 MG PO TABS: 500.0000 mg | ORAL_TABLET | Freq: Once | ORAL | Status: DC

## 2013-07-17 NOTE — Addendum Note (Signed)
Addended by: Lieutenant DiegoHINES, KEBA A on: 07/17/2013 12:05 PM   Modules accepted: Orders

## 2013-11-23 ENCOUNTER — Encounter: Payer: Self-pay | Admitting: Family Medicine

## 2013-12-31 ENCOUNTER — Encounter: Payer: Self-pay | Admitting: Family Medicine

## 2013-12-31 ENCOUNTER — Ambulatory Visit (INDEPENDENT_AMBULATORY_CARE_PROVIDER_SITE_OTHER): Payer: Managed Care, Other (non HMO) | Admitting: Family Medicine

## 2013-12-31 VITALS — BP 120/70 | HR 68 | Temp 96.1°F | Ht 66.0 in | Wt 225.0 lb

## 2013-12-31 DIAGNOSIS — L739 Follicular disorder, unspecified: Secondary | ICD-10-CM

## 2013-12-31 DIAGNOSIS — L304 Erythema intertrigo: Secondary | ICD-10-CM

## 2013-12-31 MED ORDER — MUPIROCIN CALCIUM 2 % EX CREA: 1.0000 "application " | TOPICAL_CREAM | Freq: Two times a day (BID) | CUTANEOUS | Status: DC

## 2013-12-31 NOTE — Progress Notes (Signed)
Chief Complaint  Patient presents with  . Rash    on inner thighs, outer thighs and arms x 1 week.    She first noticed a small bump on her stomach, then saw one on her left outer thigh, then developed 2-3 on the left inner thigh.  Those were smaller, and resolved on their own.  The one on the outer thigh got larger, but is smaller again.  Yesterday she noticed a bump on her right forearm.  The ones on the leg were slightly itchy, overall not itchy.  The one on the arm is a little sore to touch.  She is also complaining of a rash at right groin which started 2 weeks ago.  It started to get very itchy and irritated.  No weeping/drainage.  She used vaseline and shea butter, but it hasn't improved.  PMH, PSH, SH reviewed. Outpatient Encounter Prescriptions as of 12/31/2013  Medication Sig  . cholecalciferol (VITAMIN D) 1000 UNITS tablet Take 1,000 Units by mouth daily.  . Multiple Vitamin (MULTIVITAMIN) tablet Take 1 tablet by mouth daily.  . Norgestimate-Ethinyl Estradiol Triphasic (ORTHO TRI-CYCLEN, 28,) 0.18/0.215/0.25 MG-35 MCG tablet Take 1 tablet by mouth daily.  . mupirocin cream (BACTROBAN) 2 % Apply 1 application topically 2 (two) times daily.  . [DISCONTINUED] azithromycin (ZITHROMAX) 500 MG tablet Take 1 tablet (500 mg total) by mouth once. Take two tablets (500mg ) by mouth together.  . [DISCONTINUED] Vitamin D, Ergocalciferol, (DRISDOL) 50000 UNITS CAPS capsule Take 1 capsule (50,000 Units total) by mouth every 7 (seven) days.   No Known Allergies  ROS:  Denies fevers.  URI a month ago, resolve.  Denies congestion, cough, shortness of breath, nausea, vomiting, diarrhea. No bleeding, bruising, chest pain, shortness of breath, or other concerns except as per HPI  PHYSICAL EXAM: BP 120/70 mmHg  Pulse 68  Temp(Src) 96.1 F (35.6 C) (Tympanic)  Ht 5\' 6"  (1.676 m)  Wt 225 lb (102.059 kg)  BMI 36.33 kg/m2  LMP 12/30/2013  Well developed, pleasant female in no distress Right  forearm--there is a small papule/vesicle that just drained some clear fluid.  Slightly erythematous base (but had been rubbing).   Left lateral thigh--one scabbed area.  Another small pustule with surrounding erythema, in follicular pattern.  Inner thigh is clear Right groin--hyperpigmented with slightly raised, scaly edges, a few papules  ASSESSMENT/PLAN:  Intertrigo  Folliculitis - Plan: mupirocin cream (BACTROBAN) 2 %   Tinea cruris/intertrigo--OTC antifungal BID x 2-3 weeks (lamisil or lotrimin) Possible folliculitis (mild)--cannot r/o id reaction.  If not improving on its own, use bactroban

## 2013-12-31 NOTE — Patient Instructions (Signed)
Treat the fungal rash at the groin with over-the-counter clotrimazole or lamisil (twice daily for up to 2-3 weeks). Keep the area dry--wear cotton underwear and change when wet. Use powder (cornstarch or medicated) to keep the area dry.  If the lesions on the arm/left leg don't resolve as the other rash is treated, then fill the prescription for the bactroban.  Treat the pustules/bumps on your legs/arm with the bactroban twice daily for up to a week.  Intertrigo Intertrigo is a skin condition that occurs in between folds of skin in places on the body that rub together a lot and do not get much ventilation. It is caused by heat, moisture, friction, sweat retention, and lack of air circulation, which produces red, irritated patches and, sometimes, scaling or drainage. People who have diabetes, who are obese, or who have treatment with antibiotics are at increased risk for intertrigo. The most common sites for intertrigo to occur include:  The groin.  The breasts.  The armpits.  Folds of abdominal skin.  Webbed spaces between the fingers or toes. Intertrigo may be aggravated by:  Sweat.  Feces.  Yeast or bacteria that are present near skin folds.  Urine.  Vaginal discharge. HOME CARE INSTRUCTIONS  The following steps can be taken to reduce friction and keep the affected area cool and dry:  Expose skin folds to the air.  Keep deep skin folds separated with cotton or linen cloth. Avoid tight fitting clothing that could cause chafing.  Wear open-toed shoes or sandals to help reduce moisture between the toes.  Apply absorbent powders to affected areas as directed by your caregiver.  Apply over-the-counter barrier pastes, such as zinc oxide, as directed by your caregiver.  If you develop a fungal infection in the affected area, your caregiver may have you use antifungal creams. SEEK MEDICAL CARE IF:   The rash is not improving after 1 week of treatment.  The rash is getting  worse (more red, more swollen, more painful, or spreading).  You have a fever or chills. MAKE SURE YOU:   Understand these instructions.  Will watch your condition.  Will get help right away if you are not doing well or get worse. Document Released: 01/08/2005 Document Revised: 04/02/2011 Document Reviewed: 06/23/2009 [facility] Patient Information 2015 [place], [place]. This information is not intended to replace advice given to you by your health care provider. Make sure you discuss any questions you have with your health care provider.  Folliculitis  Folliculitis is redness, soreness, and swelling (inflammation) of the hair follicles. This condition can occur anywhere on the body. People with weakened immune systems, diabetes, or obesity have a greater risk of getting folliculitis. CAUSES  Bacterial infection. This is the most common cause.  Fungal infection.  Viral infection.  Contact with certain chemicals, especially oils and tars. Long-term folliculitis can result from bacteria that live in the nostrils. The bacteria may trigger multiple outbreaks of folliculitis over time. SYMPTOMS Folliculitis most commonly occurs on the scalp, thighs, legs, back, buttocks, and areas where hair is shaved frequently. An early sign of folliculitis is a small, white or yellow, pus-filled, itchy lesion (pustule). These lesions appear on a red, inflamed follicle. They are usually less than 0.2 inches (5 mm) wide. When there is an infection of the follicle that goes deeper, it becomes a boil or furuncle. A group of closely packed boils creates a larger lesion (carbuncle). Carbuncles tend to occur in hairy, sweaty areas of the body. DIAGNOSIS  Your caregiver  can usually tell what is wrong by doing a physical exam. A sample may be taken from one of the lesions and tested in a lab. This can help determine what is causing your folliculitis. TREATMENT  Treatment may include:  Applying warm compresses to  the affected areas.  Taking antibiotic medicines orally or applying them to the skin.  Draining the lesions if they contain a large amount of pus or fluid.  Laser hair removal for cases of long-lasting folliculitis. This helps to prevent regrowth of the hair. HOME CARE INSTRUCTIONS  Apply warm compresses to the affected areas as directed by your caregiver.  If antibiotics are prescribed, take them as directed. Finish them even if you start to feel better.  You may take over-the-counter medicines to relieve itching.  Do not shave irritated skin.  Follow up with your caregiver as directed. SEEK IMMEDIATE MEDICAL CARE IF:   You have increasing redness, swelling, or pain in the affected area.  You have a fever. MAKE SURE YOU:  Understand these instructions.  Will watch your condition.  Will get help right away if you are not doing well or get worse. Document Released: 03/19/2001 Document Revised: 07/10/2011 Document Reviewed: 04/10/2011 [facility] Patient Information 2015 [place], [place]. This information is not intended to replace advice given to you by your health care provider. Make sure you discuss any questions you have with your health care provider.

## 2014-03-26 ENCOUNTER — Other Ambulatory Visit: Payer: Self-pay | Admitting: Family Medicine

## 2014-03-26 NOTE — Telephone Encounter (Signed)
Left message to call back she has appointment 04/15/14 called to see if she could wait till her appointment

## 2014-04-15 ENCOUNTER — Encounter: Payer: Self-pay | Admitting: Family Medicine

## 2014-04-15 ENCOUNTER — Ambulatory Visit (INDEPENDENT_AMBULATORY_CARE_PROVIDER_SITE_OTHER): Payer: Managed Care, Other (non HMO) | Admitting: Family Medicine

## 2014-04-15 ENCOUNTER — Other Ambulatory Visit (HOSPITAL_COMMUNITY)
Admission: RE | Admit: 2014-04-15 | Discharge: 2014-04-15 | Disposition: A | Discharge status: Home / Self Care | Payer: Managed Care, Other (non HMO) | Source: Ambulatory Visit | Attending: Family Medicine | Admitting: Family Medicine

## 2014-04-15 VITALS — BP 124/74 | HR 64 | Ht 66.5 in | Wt 234.6 lb

## 2014-04-15 DIAGNOSIS — R896 Abnormal cytological findings in specimens from other organs, systems and tissues: Secondary | ICD-10-CM

## 2014-04-15 DIAGNOSIS — Z113 Encounter for screening for infections with a predominantly sexual mode of transmission: Secondary | ICD-10-CM | POA: Diagnosis present

## 2014-04-15 DIAGNOSIS — L83 Acanthosis nigricans: Secondary | ICD-10-CM | POA: Diagnosis not present

## 2014-04-15 DIAGNOSIS — E559 Vitamin D deficiency, unspecified: Secondary | ICD-10-CM | POA: Diagnosis not present

## 2014-04-15 DIAGNOSIS — IMO0002 Reserved for concepts with insufficient information to code with codable children: Secondary | ICD-10-CM

## 2014-04-15 DIAGNOSIS — Z01419 Encounter for gynecological examination (general) (routine) without abnormal findings: Secondary | ICD-10-CM | POA: Diagnosis present

## 2014-04-15 DIAGNOSIS — E669 Obesity, unspecified: Secondary | ICD-10-CM

## 2014-04-15 DIAGNOSIS — Z0000 Encounter for general adult medical examination without abnormal findings: Secondary | ICD-10-CM

## 2014-04-15 LAB — POCT URINALYSIS DIPSTICK
Bilirubin, UA: NEGATIVE
Glucose, UA: NEGATIVE
Ketones, UA: NEGATIVE
Leukocytes, UA: NEGATIVE
NITRITE UA: NEGATIVE
PROTEIN UA: NEGATIVE
RBC UA: NEGATIVE
Spec Grav, UA: >=1.03
UROBILINOGEN UA: NEGATIVE
pH, UA: 6

## 2014-04-15 LAB — HEMOGLOBIN A1C
Hgb A1c MFr Bld: 5.5 % (ref <5.7)
Mean Plasma Glucose: 111 mg/dL (ref <117)

## 2014-04-15 NOTE — Progress Notes (Signed)
Chief Complaint  Patient presents with  . cpe with pap    cpe with pap. having irriation under left breast after working out. no other concerns, had eye exam back in february.    Caitlin Duncan is a 24 y.o. female who presents for a complete physical.  She has the following concerns:  She has some irritation under her left breast.  She thinks this is related to her sweating, as she started working out again.  She used some hydrocortisone and it has improved. It is almost better.  Last pap smear was 03/2013--pap smear results showed ASCUS and high risk HPV, as well as a yeast infection. She took diflucan 150mg  x 1 dose. The current guidelines for age 21-24 with ASCUS and high risk HPV is to repeat pap in 12 months, due now.  Contraceptive management--She was started back on OCP's a year ago at her CPE.  She was seen 3 months later with some breakthrough bleeding. She was diagnosed with chlamydia and treated with azithromycin. Birth control pills were not changed at that time, told to keep calendar of cycles and abnormal bleeding. She hasn't had any further abnormal bleeding.  She has been using condoms regularly.  She is with the same partner, now for about 3 years.  Vitamin D deficiency--diagnosed at her physical last year, and treated with 12 weeks of prescription vitamin D.  She has been taking OTC supplement since then.  Immunization History  Administered Date(s) Administered  . HPV Quadrivalent 03/13/2010, 06/06/2010, 03/14/2011  . Tdap 04/13/2013  refuses flu shots Last Pap smear: 03/2013--ASCUS and high risk HPV, yeast infection Last mammogram: never  Last colonoscopy: never  Last DEXA: never  Dentist: yearly, last in February Ophtho: yearly, wears glasses/contacts, last in February Exercise: She is exercising 2-3x/week (treadmill, crunches, runs 1 mile in her neighborhood). She has been doing this since January (New Year). She had normal lipid screen in 02/2011 Lab Results   Component Value Date   CHOL 163 03/14/2011   HDL 72 03/14/2011   LDLCALC 79 03/14/2011   TRIG 60 03/14/2011   CHOLHDL 2.3 03/14/2011   Past Medical History  Diagnosis Date  . Dysmenorrhea   . Anemia   . Chlamydia 06/2013  . ASCUS with positive high risk HPV 03/2013    History reviewed. No pertinent past surgical history.  History   Social History  . Marital Status: Single    Spouse Name: N/A  . Number of Children: N/A  . Years of Education: N/A   Occupational History  . student    Social History Main Topics  . Smoking status: Never Smoker   . Smokeless tobacco: Never Used  . Alcohol Use: No  . Drug Use: No  . Sexual Activity:    Partners: Male    Birth Control/ Protection: Condom, Pill   Other Topics Concern  . 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 Hypertension Mother   . Kidney failure Mother     kidney transplant 2014  . Hypertension Sister   . Diabetes Neg Hx   . Cancer Neg Hx   . Heart disease Neg Hx     Outpatient Encounter Prescriptions as of 04/15/2014  Medication Sig  . cholecalciferol (VITAMIN D) 1000 UNITS tablet Take 1,000 Units by mouth daily.  . Multiple  Vitamin (MULTIVITAMIN) tablet Take 1 tablet by mouth daily.  . TRI-PREVIFEM 0.18/0.215/0.25 MG-35 MCG tablet TAKE 1 TABLET BY MOUTH EVERY DAY  . [DISCONTINUED] mupirocin cream (BACTROBAN) 2 % Apply 1 application topically 2 (two) times daily. (Patient not taking: Reported on 04/15/2014)    No Known Allergies  ROS: The patient denies anorexia, fever, headaches, vision changes, decreased hearing, ear pain, sore throat, breast concerns, chest pain, palpitations, dizziness, syncope, dyspnea on exertion, cough, swelling, diarrhea, constipation, abdominal pain, melena, hematochezia, hematuria, incontinence, dysuria, irregular menstrual cycles, vaginal  discharge, odor, itch or menstrual cramps; no genital lesions, joint pains, numbness, tingling, weakness, tremor, suspicious skin lesions, depression, anxiety, abnormal bleeding/bruising, or enlarged lymph nodes. Gained 5# in the last year (had gained 10-20 pounds in the year prior to that, due to not exercising much, worked 3rd shift and ate late at night.)   PHYSICAL EXAM:  BP 124/74 mmHg  Pulse 64  Ht 5' 6.5" (1.689 m)  Wt 234 lb 9.6 oz (106.414 kg)  BMI 37.30 kg/m2  LMP 03/18/2014  General Appearance:  Alert, cooperative, no distress, appears stated age   Head:  Normocephalic, without obvious abnormality, atraumatic   Eyes:  PERRL, conjunctiva/corneas clear, EOM's intact, fundi  benign   Ears:  Normal TM's and external ear canals   Nose:  Nares normal, with piercing at right nostril, mucosa is moderately edematous, no drainage or sinus tenderness   Throat:  Lips, mucosa, and tongue normal; teeth and gums normal. Scar on central portion of tongue  Neck:  Supple, no lymphadenopathy; thyroid: no enlargement/tenderness/nodules; no carotid  bruit or JVD   Back:  Spine nontender, no curvature, ROM normal, no CVA tenderness   Lungs:  Clear to auscultation bilaterally without wheezes, rales or ronchi; respirations unlabored   Chest Wall:  No tenderness or deformity   Heart:  Regular rate and rhythm, S1 and S2 normal, no murmur, rub  or gallop   Breast Exam:  No tenderness, masses, or nipple discharge or inversion. No axillary lymphadenopathy. Significant fibrocystic changes in upper outer quadrants bilaterally. No discrete mass   Abdomen:  Soft, non-tender, nondistended, normoactive bowel sounds,  no masses, no hepatosplenomegaly   Genitalia:  Normal external genitalia without lesions. BUS and vagina normal; cervix without lesions, or cervical motion tenderness. Small amount of thin white discharge. Uterus and adnexa not enlarged, nontender, no masses. Pap  performed   Rectal:  Not performed due to age<40 and no related complaints   Extremities:  No clubbing, cyanosis or edema   Pulses:  2+ and symmetric all extremities   Skin:  Skin color, texture, turgor normal, no rashes or lesions. Acanthosis nigricans around neck  Lymph nodes:  Cervical, supraclavicular, and axillary nodes normal   Neurologic:  CNII-XII intact, normal strength, sensation and gait; reflexes 2+ and symmetric throughout                            Psych:  Normal mood, affect, hygiene and grooming.        Urine dip normal (concentrated urine).   ASSESSMENT/PLAN:  Routine general medical examination at a health care facility - Plan: POCT urinalysis dipstick, Hemoglobin A1c, Cytology - PAP [place], HIV antibody  ASCUS with positive high risk HPV - Plan: Cytology - PAP [place]  Vitamin D deficiency - Plan: Vit D  25 hydroxy (rtn osteoporosis monitoring)  Obesity, Class II, BMI 35-39.9 - Plan: Hemoglobin A1c  Acanthosis nigricans -  Plan: Hemoglobin A1c   Discussed monthly self breast exams and yearly mammograms after the age of 73; at least 30 minutes of aerobic activity at least 5 days/week; proper sunscreen use reviewed; healthy diet, including goals of calcium and vitamin D intake and alcohol recommendations (less than or equal to 1 drink/day) reviewed; regular seatbelt use; changing batteries in smoke detectors. Immunization recommendations discussed--flu shots recommended yearly (she has refused in the past, recommended)   F/u 1 year, sooner prn

## 2014-04-15 NOTE — Patient Instructions (Signed)

## 2014-04-16 LAB — VITAMIN D 25 HYDROXY (VIT D DEFICIENCY, FRACTURES): Vit D, 25-Hydroxy: 8 ng/mL — ABNORMAL LOW (ref 30–100)

## 2014-04-16 LAB — HIV ANTIBODY (ROUTINE TESTING W REFLEX): HIV: NONREACTIVE

## 2014-04-19 MED ORDER — VITAMIN D (ERGOCALCIFEROL) 1.25 MG (50000 UNIT) PO CAPS: 50000.0000 [IU] | ORAL_CAPSULE | ORAL | Status: DC

## 2014-04-19 NOTE — Addendum Note (Signed)
Addended by: Herminio CommonsJOHNSON, SABRINA A on: 04/19/2014 12:24 PM   Modules accepted: Orders

## 2014-04-21 LAB — CYTOLOGY - PAP

## 2014-06-17 ENCOUNTER — Other Ambulatory Visit: Payer: Self-pay | Admitting: Family Medicine

## 2014-10-25 ENCOUNTER — Telehealth: Payer: Self-pay | Admitting: Internal Medicine

## 2014-10-25 ENCOUNTER — Other Ambulatory Visit (INDEPENDENT_AMBULATORY_CARE_PROVIDER_SITE_OTHER): Payer: Managed Care, Other (non HMO)

## 2014-10-25 DIAGNOSIS — Z111 Encounter for screening for respiratory tuberculosis: Secondary | ICD-10-CM | POA: Diagnosis not present

## 2014-10-25 NOTE — Telephone Encounter (Signed)
Pt had a TB placed today for her work but needs a form filled out for her job. She had cpe back in march. Will place in your red folder. Pt will come back in on Wednesday to get tb skin test read

## 2014-10-26 NOTE — Telephone Encounter (Signed)
FFO, awaiting PPD reading (in red folder, otherwise completed/signed)

## 2014-10-28 LAB — TB SKIN TEST
Induration: 0 mm
TB Skin Test: NEGATIVE

## 2015-02-22 ENCOUNTER — Ambulatory Visit (INDEPENDENT_AMBULATORY_CARE_PROVIDER_SITE_OTHER): Payer: Managed Care, Other (non HMO) | Admitting: Family Medicine

## 2015-02-22 ENCOUNTER — Encounter: Payer: Self-pay | Admitting: Family Medicine

## 2015-02-22 VITALS — BP 116/76 | HR 82 | Wt 226.0 lb

## 2015-02-22 DIAGNOSIS — J069 Acute upper respiratory infection, unspecified: Secondary | ICD-10-CM

## 2015-02-22 NOTE — Progress Notes (Signed)
   Subjective:    Patient ID: Caitlin Duncan, female    DOB: August 19, 1990, 25 y.o.   MRN: [id number]  HPI She complains of a one-week history this started with a dry cough progressing to a productive cough, nasal congestion, slight sore throat, rhinorrhea and nasal congestion. No earache. She has slightly better today. She has been using Mucinex and NyQuil   Review of Systems     Objective:   Physical Exam Alert and in no distress. Tympanic membranes and canals are normal. Pharyngeal area is normal. Neck is supple without adenopathy or thyromegaly. Cardiac exam shows a regular sinus rhythm without murmurs or gallops. Lungs are clear to auscultation.        Assessment & Plan:  Acute URI Recommend supportive care including Robitussin-DM or Mucinex DM. Call in 2 days if no improvement.

## 2015-02-22 NOTE — Patient Instructions (Signed)
You can try Mucinex DM or Robitussin-DM for the coughing and you can use NyQuil at night. Given a day or 2 but if you don't continue to do better call me

## 2015-04-21 ENCOUNTER — Encounter: Payer: Self-pay | Admitting: Family Medicine

## 2015-04-21 ENCOUNTER — Ambulatory Visit (INDEPENDENT_AMBULATORY_CARE_PROVIDER_SITE_OTHER): Payer: Managed Care, Other (non HMO) | Admitting: Family Medicine

## 2015-04-21 ENCOUNTER — Other Ambulatory Visit (HOSPITAL_COMMUNITY)
Admission: RE | Admit: 2015-04-21 | Discharge: 2015-04-21 | Disposition: A | Discharge status: Home / Self Care | Payer: Managed Care, Other (non HMO) | Source: Ambulatory Visit | Attending: Family Medicine | Admitting: Family Medicine

## 2015-04-21 VITALS — BP 116/70 | HR 80 | Ht 66.5 in | Wt 232.6 lb

## 2015-04-21 DIAGNOSIS — Z0000 Encounter for general adult medical examination without abnormal findings: Secondary | ICD-10-CM

## 2015-04-21 DIAGNOSIS — E559 Vitamin D deficiency, unspecified: Secondary | ICD-10-CM | POA: Diagnosis not present

## 2015-04-21 DIAGNOSIS — Z01419 Encounter for gynecological examination (general) (routine) without abnormal findings: Secondary | ICD-10-CM | POA: Diagnosis not present

## 2015-04-21 DIAGNOSIS — E669 Obesity, unspecified: Secondary | ICD-10-CM | POA: Diagnosis not present

## 2015-04-21 LAB — POCT URINALYSIS DIPSTICK
BILIRUBIN UA: NEGATIVE
Blood, UA: NEGATIVE
GLUCOSE UA: NEGATIVE
KETONES UA: NEGATIVE
LEUKOCYTES UA: NEGATIVE
Nitrite, UA: NEGATIVE
PROTEIN UA: NEGATIVE
Urobilinogen, UA: NEGATIVE
pH, UA: 6

## 2015-04-21 LAB — CBC WITH DIFFERENTIAL/PLATELET
BASOS PCT: 0 % (ref 0–1)
Basophils Absolute: 0 10*3/uL (ref 0.0–0.1)
EOS PCT: 1 % (ref 0–5)
Eosinophils Absolute: 0.1 10*3/uL (ref 0.0–0.7)
HEMATOCRIT: 37.7 % (ref 36.0–46.0)
Hemoglobin: 12.3 g/dL (ref 12.0–15.0)
LYMPHS PCT: 38 % (ref 12–46)
Lymphs Abs: 2.3 10*3/uL (ref 0.7–4.0)
MCH: 27.8 pg (ref 26.0–34.0)
MCHC: 32.6 g/dL (ref 30.0–36.0)
MCV: 85.3 fL (ref 78.0–100.0)
MONO ABS: 0.6 10*3/uL (ref 0.1–1.0)
MONOS PCT: 10 % (ref 3–12)
MPV: 11.8 fL (ref 8.6–12.4)
Neutro Abs: 3.1 10*3/uL (ref 1.7–7.7)
Neutrophils Relative %: 51 % (ref 43–77)
Platelets: 236 10*3/uL (ref 150–400)
RBC: 4.42 MIL/uL (ref 3.87–5.11)
RDW: 14 % (ref 11.5–15.5)
WBC: 6 10*3/uL (ref 4.0–10.5)

## 2015-04-21 LAB — GLUCOSE, RANDOM: GLUCOSE: 83 mg/dL (ref 65–99)

## 2015-04-21 NOTE — Patient Instructions (Signed)

## 2015-04-21 NOTE — Progress Notes (Signed)
Chief Complaint  Patient presents with  . Annual Exam    fasting annual exam with pap. Did not do eye exam, she is going to [facility] next week. Has a rash on lower stomach area (under roll).    Caitlin Duncan is a 25 y.o. female who presents for a complete physical.  She has the following concerns:  She noticed a rash in the skin fold of her lower abdomen earlier this week, when very hot/perspiring.  She reports it has faded, and is no longer itchy, resolving.  Vitamin D deficiency:  Taking daily supplement of 1000 IU plus a MVI.  Level was 8 last year. She took prescription x 3 months. She didn't return for recheck (and only started 1000 IU rather than the 2000 IU that was recommended).  H/o abnormal pap in 03/2013--pap smear results showed ASCUS and high risk HPV, as well as a yeast infection. She took diflucan  x 1 dose. The current guidelines for age 17-24 with ASCUS and high risk HPV is to repeat pap in 12 months. Repeat pap last year was normal, other than yeast.    Contraceptive management--She is no longer taking OCP's.  She had a breakout, wasn't sure what caused it, so stopped her pills and vitamins she had been taking (garcinia).  She is using condoms regularly. H/o chlamydia in the past. Monogamous relationship, same partner as lat year.  Immunization History  Administered Date(s) Administered  . HPV Quadrivalent 03/13/2010, 06/06/2010, 03/14/2011  . PPD Test 10/25/2014  . Tdap 04/13/2013   Last Pap smear: 03/2014 Last mammogram: never  Last colonoscopy: never  Last DEXA: never  Dentist: yearly, scheduled in 2 weeks Ophtho: yearly, wears glasses/contacts, scheduled for next week Exercise: She does 30 minutes on the treadmill as well as walking on the track at her job (3x around, doesn't know time or distance), 5 days/week.    Lipid: Lab Results  Component Value Date   CHOL 163 03/14/2011   HDL 72 03/14/2011   LDLCALC 79 03/14/2011   TRIG 60 03/14/2011    CHOLHDL 2.3 03/14/2011   Past Medical History  Diagnosis Date  . Dysmenorrhea     resolved  . Anemia   . Chlamydia 06/2013  . ASCUS with positive high risk HPV 03/2013  . Vitamin D deficiency 03/2013    History reviewed. No pertinent past surgical history.  Social History   Social History  . Marital Status: Single    Spouse Name: N/A  . Number of Children: N/A  . Years of Education: N/A   Occupational History  . student    Social History Main Topics  . Smoking status: Never Smoker   . Smokeless tobacco: Never Used  . Alcohol Use: No  . Drug Use: No  . Sexual Activity:    Partners: Male    Birth Control/ Protection: Condom   Other Topics Concern  . 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 Hypertension Mother   . Kidney failure Mother     kidney transplant 2014 (related to pregnancy)  . Hypertension Sister   . Diabetes Neg Hx   . Cancer Neg Hx   . Heart disease Neg Hx     Outpatient Encounter Prescriptions as of 04/21/2015  Medication Sig  . cholecalciferol (VITAMIN D) 1000  UNITS tablet Take 1,000 Units by mouth daily.  . Multiple Vitamin (MULTIVITAMIN) tablet Take 1 tablet by mouth daily.  . [DISCONTINUED] TRI-PREVIFEM 0.18/0.215/0.25 MG-35 MCG tablet TAKE 1 TABLET BY MOUTH EVERY DAY  . [DISCONTINUED] Vitamin D, Ergocalciferol, (DRISDOL) 50000 UNITS CAPS capsule Take 1 capsule (50,000 Units total) by mouth every 7 (seven) days.   No facility-administered encounter medications on file as of 04/21/2015.    No Known Allergies  ROS: The patient denies anorexia, fever, headaches, vision changes, decreased hearing, ear pain, sore throat, breast concerns, chest pain, palpitations, dizziness, syncope, dyspnea on exertion, cough, swelling, diarrhea, constipation, abdominal pain, melena, hematochezia,  hematuria, incontinence, dysuria, irregular menstrual cycles, vaginal discharge, odor, itch or menstrual cramps; no genital lesions, joint pains, numbness, tingling, weakness, tremor, suspicious skin lesions, depression, anxiety, abnormal bleeding/bruising, or enlarged lymph nodes.  Menses are regular, heavy for the first 2 days only.   PHYSICAL EXAM:  BP 116/70 mmHg  Pulse 80  Ht 5' 6.5" (1.689 m)  Wt 232 lb 9.6 oz (105.507 kg)  BMI 36.98 kg/m2  LMP 03/31/2015   General Appearance:  Alert, cooperative, no distress, appears stated age   Head:  Normocephalic, without obvious abnormality, atraumatic   Eyes:  PERRL, conjunctiva/corneas clear, EOM's intact, fundi  benign   Ears:  Normal TM's and external ear canals   Nose:  Nares normal, mucosa is mildly edematous, no drainage or sinus tenderness   Throat:  Lips, mucosa, and tongue normal; teeth and gums normal. Scar on central portion of tongue  Neck:  Supple, no lymphadenopathy; thyroid: no enlargement/tenderness/nodules; no carotid  bruit or JVD   Back:  Spine nontender, no curvature, ROM normal, no CVA tenderness   Lungs:  Clear to auscultation bilaterally without wheezes, rales or ronchi; respirations unlabored   Chest Wall:  No tenderness or deformity   Heart:  Regular rate and rhythm, S1 and S2 normal, no murmur, rub  or gallop   Breast Exam:  No tenderness, masses, or nipple discharge or inversion. No axillary lymphadenopathy. Significant fibrocystic changes in upper outer quadrants bilaterally. No discrete mass   Abdomen:  Soft, non-tender, nondistended, normoactive bowel sounds,  no masses, no hepatosplenomegaly   Genitalia:  Normal external genitalia without lesions. BUS and vagina normal; cervix without lesions, or cervical motion tenderness. Small amount of thin white discharge. Uterus and adnexa not enlarged, nontender, no masses. Pap performed   Rectal:  Not  performed due to age<40 and no related complaints   Extremities:  No clubbing, cyanosis or edema   Pulses:  2+ and symmetric all extremities   Skin:  Skin color, texture, turgor normal, no rashes or lesions. Acanthosis nigricans around neck  Lymph nodes:  Cervical, supraclavicular, and axillary nodes normal   Neurologic:  CNII-XII intact, normal strength, sensation and gait; reflexes 2+ and symmetric throughout    Psych: Normal mood, affect, hygiene and grooming.              ASSESSMENT/PLAN:  Annual physical exam - Plan: POCT Urinalysis Dipstick, VITAMIN D 25 Hydroxy (Vit-D Deficiency, Fractures), CBC with Differential/Platelet, Glucose, random, Cytology - PAP [place]  Vitamin D deficiency  Obesity (BMI 35.0-39.9 without comorbidity) (HCC) - counseled re: diet, exercise, heatlhy food choices, portion control - Plan: Glucose, random   Discussed monthly self breast exams and yearly mammograms after the age of 25; at least 30 minutes of aerobic activity at least 5 days/week; proper sunscreen use reviewed; healthy diet, including goals of calcium and vitamin D  intake and alcohol recommendations (less than or equal to 1 drink/day) reviewed; regular seatbelt use; changing batteries in smoke detectors. Immunization recommendations discussed--flu shots recommended yearly (she has refused in the past, recommended)  Encouraged weight loss.  Drink less gatorade, Kool Aid   F/u 1 year, sooner prn

## 2015-04-22 LAB — VITAMIN D 25 HYDROXY (VIT D DEFICIENCY, FRACTURES): Vit D, 25-Hydroxy: 9 ng/mL — ABNORMAL LOW (ref 30–100)

## 2015-04-25 ENCOUNTER — Other Ambulatory Visit: Payer: Self-pay | Admitting: *Deleted

## 2015-04-25 DIAGNOSIS — E559 Vitamin D deficiency, unspecified: Secondary | ICD-10-CM

## 2015-04-25 MED ORDER — ERGOCALCIFEROL 1.25 MG (50000 UT) PO CAPS: 50000.0000 [IU] | ORAL_CAPSULE | ORAL | Status: DC

## 2015-04-26 LAB — CYTOLOGY - PAP

## 2015-07-15 ENCOUNTER — Other Ambulatory Visit: Payer: Self-pay | Admitting: Family Medicine

## 2015-08-24 ENCOUNTER — Ambulatory Visit (INDEPENDENT_AMBULATORY_CARE_PROVIDER_SITE_OTHER): Payer: Managed Care, Other (non HMO) | Admitting: Family Medicine

## 2015-08-24 ENCOUNTER — Encounter: Payer: Self-pay | Admitting: Family Medicine

## 2015-08-24 VITALS — BP 128/80 | HR 76 | Temp 99.3°F | Ht 66.5 in | Wt 228.6 lb

## 2015-08-24 DIAGNOSIS — N6082 Other benign mammary dysplasias of left breast: Secondary | ICD-10-CM

## 2015-08-24 DIAGNOSIS — B354 Tinea corporis: Secondary | ICD-10-CM

## 2015-08-24 NOTE — Progress Notes (Signed)
Chief Complaint  Patient presents with  . Rash    on her right arm and was uneder her breast-also had a cyst on her left breast that drained and has really dry skin near her nipple area.    Bit by a mosquito on her right elbow, then developed a rash on the right arm. The bite resolved, but has a persistent rash on the same arm around the same area for a little over a week. She had been in the yard, and thinks she was bitten by something.  There was no tick. The area of the bite is below the area of the rash (not in the center).  The rash is itchy.  She has been using hydrocortisone cream which helps the itching.  It hasn't spread since the rash was first noticed. She has an inside dog.  She noticed a bump about a month ago on the left breast.  It was a dry, small bump, then it started draining and was painful. Drainage was bloody.  Pain improved after it drained, it got smaller.  It is now smaller, a residual bump that is nontender.  PMH, PSH, SH reviewed  Outpatient Encounter Prescriptions as of 08/24/2015  Medication Sig  . Cholecalciferol (VITAMIN D) 2000 units tablet Take 2,000 Units by mouth daily.  . ferrous sulfate 325 (65 FE) MG tablet Take 325 mg by mouth daily with breakfast.  . Multiple Vitamin (MULTIVITAMIN) tablet Take 1 tablet by mouth daily.  . [DISCONTINUED] ergocalciferol (VITAMIN D2) 50000 units capsule Take 1 capsule (50,000 Units total) by mouth once a week.   No facility-administered encounter medications on file as of 08/24/2015.    No Known Allergies  ROS: Denies fever, chills, nausea, vomiting, diarrhea, myalgias, arthralgias. No chest pain, shortness of breath.  See HPI  PHYSICAL EXAM: BP 128/80   Pulse 76   Temp 99.3 F (37.4 C) (Tympanic)   Ht 5' 6.5" (1.689 m)   Wt 228 lb 9.6 oz (103.7 kg)   LMP 08/02/2015 (Exact Date)   BMI 36.34 kg/m   Well developed, pleasant female in no distress Skin: Right arm--laterally just above the elbow there is a ring-like  area measuring 7 x 4cm, with a serpiginous border and central clearing.  There is not any redness or flaking noted, just very fine raised papules, not vesicles, forming the oblong/oval shape. Area of bite/papule was below this, not central, and has resolved.  Left breast: 4 o'clock location on the breast, outside of the areola, there is 0.5 x 0.5cm central dark area, surrounded by an oval/elliptical hypopigmented area (almost appears like an eye).  This is nontender, no drainage, no erythema or warmth. No fluctuance or induration just slight firmness at the discolored central portion.  The breast exam is normal--notable for diffuse fibroglandular changes, but no masses, no axillary lymphadenopathy  ASSESSMENT/PLAN:  Tinea corporis  Sebaceous cyst of breast, left    Rash on RUE-- Given appearance, will treat for tinea corporis. Ddx reviewed (contact dermatitis, vs granuloma annulare vs other). Doesn't sound like there was any tick bite.   Use clotrimazole (lotrimin) or Lamisil twice daily for up to 3 weeks to treat for ringworm. If rash is continuing to enlarge, or changes in any way, return for re-evaluation.  The lesion on the left breast does appear consistent with a cyst that became infected and drained.  It looks like it is healing, and no evidence of ongoing infection.  No antibiotic is needed. If it starts to  become tender or enlarging again, use warm compresses.

## 2015-08-24 NOTE — Patient Instructions (Addendum)
Use clotrimazole (lotrimin) or Lamisil twice daily for up to 3 weeks to treat for ringworm. If rash is continuing to enlarge, or changes in any way, return for re-evaluation.  The lesion on the left breast does appear consistent with a cyst that became infected and drained.  It looks like it is healing, and no evidence of ongoing infection.  No antibiotic is needed. If it starts to become tender or enlarging again, use warm compresses.  Body Ringworm Ringworm (tinea corporis) is a fungal infection of the skin on the body. This infection is not caused by worms, but is actually caused by a fungus. Fungus normally lives on the top of your skin and can be useful. However, in the case of ringworms, the fungus grows out of control and causes a skin infection. It can involve any area of skin on the body and can spread easily from one person to another (contagious). Ringworm is a common problem for children, but it can affect adults as well. Ringworm is also often found in athletes, especially wrestlers who share equipment and mats.  CAUSES  Ringworm of the body is caused by a fungus called dermatophyte. It can spread by:  Touchingother people who are infected.  Touchinginfected pets.  Touching or sharingobjects that have been in contact with the infected person or pet (hats, combs, towels, clothing, sports equipment). SYMPTOMS   Itchy, raised red spots and bumps on the skin.  Ring-shaped rash.  Redness near the border of the rash with a clear center.  Dry and scaly skin on or around the rash. Not every person develops a ring-shaped rash. Some develop only the red, scaly patches. DIAGNOSIS  Most often, ringworm can be diagnosed by performing a skin exam. Your caregiver may choose to take a skin scraping from the affected area. The sample will be examined under the microscope to see if the fungus is present.  TREATMENT  Body ringworm may be treated with a topical antifungal cream or  ointment. Sometimes, an antifungal shampoo that can be used on your body is prescribed. You may be prescribed antifungal medicines to take by mouth if your ringworm is severe, keeps coming back, or lasts a long time.  HOME CARE INSTRUCTIONS   Only take over-the-counter or prescription medicines as directed by your caregiver.  Wash the infected area and dry it completely before applying yourcream or ointment.  When using antifungal shampoo to treat the ringworm, leave the shampoo on the body for 3-5 minutes before rinsing.   Wear loose clothing to stop clothes from rubbing and irritating the rash.  Wash or change your bed sheets every night while you have the rash.  Have your pet treated by your veterinarian if it has the same infection. To prevent ringworm:   Practice good hygiene.  Wear sandals or shoes in public places and showers.  Do not share personal items with others.  Avoid touching red patches of skin on other people.  Avoid touching pets that have bald spots or wash your hands after doing so. SEEK MEDICAL CARE IF:   Your rash continues to spread after 7 days of treatment.  Your rash is not gone in 4 weeks.  The area around your rash becomes red, warm, tender, and swollen.   This information is not intended to replace advice given to you by your health care provider. Make sure you discuss any questions you have with your health care provider.   Document Released: 01/06/2000 Document Revised: 10/03/2011 Document Reviewed:  07/23/2011 Elsevier Interactive Patient Education 2016 [employer]   Epidermal Cyst An epidermal cyst is sometimes called a sebaceous cyst, epidermal inclusion cyst, or infundibular cyst. These cysts usually contain a substance that looks "pasty" or "cheesy" and may have a bad smell. This substance is a protein called keratin. Epidermal cysts are usually found on the face, neck, or trunk. They may also occur in the vaginal area or other parts of  the genitalia of both men and women. Epidermal cysts are usually small, painless, slow-growing bumps or lumps that move freely under the skin. It is important not to try to pop them. This may cause an infection and lead to tenderness and swelling. CAUSES  Epidermal cysts may be caused by a deep penetrating injury to the skin or a plugged hair follicle, often associated with acne. SYMPTOMS  Epidermal cysts can become inflamed and cause:  Redness.  Tenderness.  Increased temperature of the skin over the bumps or lumps.  Grayish-white, bad smelling material that drains from the bump or lump. DIAGNOSIS  Epidermal cysts are easily diagnosed by your caregiver during an exam. Rarely, a tissue sample (biopsy) may be taken to rule out other conditions that may resemble epidermal cysts. TREATMENT   Epidermal cysts often get better and disappear on their own. They are rarely ever cancerous.  If a cyst becomes infected, it may become inflamed and tender. This may require opening and draining the cyst. Treatment with antibiotics may be necessary. When the infection is gone, the cyst may be removed with minor surgery.  Small, inflamed cysts can often be treated with antibiotics or by injecting steroid medicines.  Sometimes, epidermal cysts become large and bothersome. If this happens, surgical removal in your caregiver's office may be necessary. HOME CARE INSTRUCTIONS  Only take over-the-counter or prescription medicines as directed by your caregiver.  Take your antibiotics as directed. Finish them even if you start to feel better. SEEK MEDICAL CARE IF:   Your cyst becomes tender, red, or swollen.  Your condition is not improving or is getting worse.  You have any other questions or concerns. MAKE SURE YOU:  Understand these instructions.  Will watch your condition.  Will get help right away if you are not doing well or get worse.   This information is not intended to replace advice  given to you by your health care provider. Make sure you discuss any questions you have with your health care provider.   Document Released: 12/10/2003 Document Revised: 04/02/2011 Document Reviewed: 07/17/2010 Elsevier Interactive Patient Education [employer]

## 2015-10-27 ENCOUNTER — Other Ambulatory Visit: Payer: Managed Care, Other (non HMO)

## 2015-10-27 DIAGNOSIS — E559 Vitamin D deficiency, unspecified: Secondary | ICD-10-CM

## 2015-10-28 ENCOUNTER — Other Ambulatory Visit: Payer: Self-pay | Admitting: Family Medicine

## 2015-10-28 DIAGNOSIS — E559 Vitamin D deficiency, unspecified: Secondary | ICD-10-CM

## 2015-10-28 LAB — VITAMIN D 25 HYDROXY (VIT D DEFICIENCY, FRACTURES): VIT D 25 HYDROXY: 17 ng/mL — AB (ref 30–100)

## 2015-10-28 MED ORDER — VITAMIN D (ERGOCALCIFEROL) 1.25 MG (50000 UNIT) PO CAPS: 50000.0000 [IU] | ORAL_CAPSULE | ORAL | 0 refills | Status: DC

## 2015-12-07 ENCOUNTER — Encounter: Payer: Self-pay | Admitting: Family Medicine

## 2015-12-07 ENCOUNTER — Ambulatory Visit (INDEPENDENT_AMBULATORY_CARE_PROVIDER_SITE_OTHER): Payer: Managed Care, Other (non HMO) | Admitting: Family Medicine

## 2015-12-07 VITALS — BP 120/82 | HR 69 | Wt 236.6 lb

## 2015-12-07 DIAGNOSIS — L298 Other pruritus: Secondary | ICD-10-CM | POA: Diagnosis not present

## 2015-12-07 DIAGNOSIS — B373 Candidiasis of vulva and vagina: Secondary | ICD-10-CM

## 2015-12-07 DIAGNOSIS — B3731 Acute candidiasis of vulva and vagina: Secondary | ICD-10-CM

## 2015-12-07 DIAGNOSIS — N898 Other specified noninflammatory disorders of vagina: Secondary | ICD-10-CM | POA: Diagnosis not present

## 2015-12-07 LAB — POCT WET PREP (WET MOUNT)
Clue Cells Wet Prep Whiff POC: NEGATIVE
KOH WET PREP POC: POSITIVE
TRICHOMONAS WET PREP HPF POC: ABSENT

## 2015-12-07 MED ORDER — FLUCONAZOLE 150 MG PO TABS: 150.0000 mg | ORAL_TABLET | Freq: Once | ORAL | 0 refills | Status: AC

## 2015-12-07 NOTE — Patient Instructions (Signed)

## 2015-12-07 NOTE — Progress Notes (Signed)
   Subjective:    Patient ID: Caitlin Duncan, female    DOB: 12/09/1990, 25 y.o.   MRN: [id number]  HPI Chief Complaint  Patient presents with  . yeast infection    yeast infection- taking pencillin for wisdom teeth- started monday   She is here with complaints of a 3 day history of vaginal itchy and discharge that is white and thick. Is currently taking penicillin for a recent oral surgery.  No urinary symptoms.  Denies history of yeast infection.   Denies fever, chills, nausea, vomiting, diarrhea.   LMP:  11/16/2015.  Condoms for contraception.   Chlamydia 2 years ago and treated.   Review of Systems Pertinent positives and negatives in the history of present illness.     Objective:   Physical Exam  Constitutional: She appears well-developed and well-nourished. No distress.  Genitourinary: Vaginal discharge found.  Genitourinary Comments: White thick clumpy discharge to vaginal vault.    BP 120/82   Pulse 69   Wt 236 lb 9.6 oz (107.3 kg)   LMP 11/16/2015 (Exact Date)   BMI 37.62 kg/m       Assessment & Plan:  Vaginal itching  Vaginal discharge  Candidal vulvovaginitis  Wet mount: pos yeast, neg BV and trich Advised her to take a probiotic or eat yogurt daily while taking antibiotic.  Educated on yeast infections. Diflucan sent to pharmacy.  She will call if not better in 3 days.

## 2015-12-07 NOTE — Addendum Note (Signed)
Addended by: Herminio CommonsJOHNSON, Vondell Sowell A on: 12/07/2015 02:50 PM   Modules accepted: Orders

## 2015-12-27 ENCOUNTER — Telehealth: Payer: Self-pay | Admitting: Family Medicine

## 2015-12-27 NOTE — Telephone Encounter (Signed)
Called and left message for pt to call and schedule flu shot or give us info about when/where she has already had a flu shot for the year

## 2016-04-05 ENCOUNTER — Ambulatory Visit (INDEPENDENT_AMBULATORY_CARE_PROVIDER_SITE_OTHER): Payer: Managed Care, Other (non HMO) | Admitting: Family Medicine

## 2016-04-05 ENCOUNTER — Encounter: Payer: Self-pay | Admitting: Family Medicine

## 2016-04-05 VITALS — BP 106/70 | HR 76 | Temp 98.7°F | Ht 66.5 in | Wt 236.6 lb

## 2016-04-05 DIAGNOSIS — H578 Other specified disorders of eye and adnexa: Secondary | ICD-10-CM | POA: Diagnosis not present

## 2016-04-05 DIAGNOSIS — R21 Rash and other nonspecific skin eruption: Secondary | ICD-10-CM

## 2016-04-05 DIAGNOSIS — J020 Streptococcal pharyngitis: Secondary | ICD-10-CM | POA: Diagnosis not present

## 2016-04-05 DIAGNOSIS — H5789 Other specified disorders of eye and adnexa: Secondary | ICD-10-CM

## 2016-04-05 LAB — POCT RAPID STREP A (OFFICE): Rapid Strep A Screen: POSITIVE — AB

## 2016-04-05 MED ORDER — AMOXICILLIN 875 MG PO TABS: 875.0000 mg | ORAL_TABLET | Freq: Two times a day (BID) | ORAL | 0 refills | Status: DC

## 2016-04-05 NOTE — Progress Notes (Signed)
Chief Complaint  Patient presents with  . Allergic Reaction    face, arm and hands. Started when she woke up. Itching, not painful but uncomfortable.     She woke up this morning with her eyes swollen shut (puffy, not crusted). She had bumps on her forearms and hands, some slight irritation on her ears.  She had some bumps on her shin, and she has one bump on her left cheek.  These bumps are all itchy.   She used a warm rag on her eyes this morning, and eye swelling went down. She hasn't taken any benadryl or other medications yet today for this.  She went to a different gym yesterday.  She used their cleaner to wipe down equipment.  She used her own towel.  Friend cooked for her, used whole garlic (she usually uses powder).  No other change in foods, detergents, soaps, products, etc. Hasn't been in the yard/garden.  She moved 3/1 into a new apartment.  Had a cough last week.  Took Nyquil and Mucinex.  She did notice some white bumps in her throat earlier this week.  Denies sore throat currently.  PMH, PSH, SH reviewed  Outpatient Encounter Prescriptions as of 04/05/2016  Medication Sig  . Cholecalciferol (VITAMIN D) 2000 units tablet Take 2,000 Units by mouth daily.  . ferrous sulfate 325 (65 FE) MG tablet Take 325 mg by mouth daily with breakfast.  . Multiple Vitamin (MULTIVITAMIN) tablet Take 1 tablet by mouth daily.  . [DISCONTINUED] Vitamin D, Ergocalciferol, (DRISDOL) 50000 units CAPS capsule Take 1 capsule (50,000 Units total) by mouth every 7 (seven) days.   No facility-administered encounter medications on file as of 04/05/2016.    No Known Allergies  ROS:  Denies fever, chills, URI symptoms (resolved), allergy symptoms, cough, shortness of breath, chest pain.  No bleeding, bruising.  Denies myalgias/arthralgias.  Slight back pain which she relates to her workout.  Had a boil last month on her right arm at elbow.  It drained, and is no longer painful.  PHYSICAL EXAM:  BP  106/70 (BP Location: Right Arm, Patient Position: Sitting, Cuff Size: Normal)   Pulse 76   Temp 98.7 F (37.1 C) (Tympanic)   Ht 5' 6.5" (1.689 m)   Wt 236 lb 9.6 oz (107.3 kg)   LMP 03/22/2016 (Exact Date)   BMI 37.62 kg/m    Well appearing, pleasant female in no distress HEENT: PERRL, EOMI, conjunctiva and sclera clear.  Very minimal thickening of bilateral upper eyelids noted--no erythema or rash. There is a 4-5 mm slightly pink papule at her left cheek.  nontender TM's and EAC's normal.  OP:  White spot on the upper left tonsil. Tonsils are not enlarged, no erythema. Rest of OP is clear Neck: no lymphadenopathy, thyromegaly or mass Heart: regular rate and rhythm Lungs: clear bilaterally Skin: Scar at right lateral elbow (healed boil)--nontender.  Skin is clear on arms, neck (bumps she had noted resolved). Neuro: alert and oriented, cranial nerves intact, normal strength, gait Psych: normal mood, affect, hygiene and grooming   Strep test +  ASSESSMENT/PLAN:  Rash and nonspecific skin eruption - allergic reaction, vs rrelated to strep throat. Most of rash resolved; antihistamines and supportive measures.  f/u if worse - Plan: Rapid Strep A  Eye swelling, bilateral  Strep pharyngitis - rash no c/w with scarlet fever in that it self resolved (except cheek papule), so may have two processes. Treat strep - Plan: Rapid Strep A, amoxicillin (AMOXIL) 875 MG  tablet    I recommend using an anti-histamine such as claritin or allegra or zyrtec today, and using benadryl in addition, if needed (especially at bedtime, if still itching--this may make you sleepy).  Use cool compresses to the eyes, if needed for any swelling. Contact us if you develop any worsening symptoms, especially if any tongue/throat swelling or shortness of breath.

## 2016-04-05 NOTE — Patient Instructions (Addendum)
You have strep throat.  It is possible that this is causing the rash (but cannot rule out other causes--usually that type of rash gets worse, doesn't come and go).  The good news is that it seems to be going away on its own (the eyes are less puffy, and the bumps went away). I recommend using an anti-histamine such as claritin or allegra or zyrtec today, and using benadryl in addition, if needed (especially at bedtime, if still itching--this may make you sleepy).  Use cool compresses to the eyes, if needed for any swelling. Contact us if you develop any worsening symptoms, especially if any tongue/throat swelling or shortness of breath.    Strep Throat Strep throat is a bacterial infection of the throat. Your health care provider may call the infection tonsillitis or pharyngitis, depending on whether there is swelling in the tonsils or at the back of the throat. Strep throat is most common during the cold months of the year in children who are 485-26 years of age, but it can happen during any season in people of any age. This infection is spread from person to person (contagious) through coughing, sneezing, or close contact. What are the causes? Strep throat is caused by the bacteria called Streptococcus pyogenes. What increases the risk? This condition is more likely to develop in:  People who spend time in crowded places where the infection can spread easily.  People who have close contact with someone who has strep throat. What are the signs or symptoms? Symptoms of this condition include:  Fever or chills.  Redness, swelling, or pain in the tonsils or throat.  Pain or difficulty when swallowing.  White or yellow spots on the tonsils or throat.  Swollen, tender glands in the neck or under the jaw.  Red rash all over the body (rare). How is this diagnosed? This condition is diagnosed by performing a rapid strep test or by taking a swab of your throat (throat culture test). Results  from a rapid strep test are usually ready in a few minutes, but throat culture test results are available after one or two days. How is this treated? This condition is treated with antibiotic medicine. Follow these instructions at home: Medicines   Take over-the-counter and prescription medicines only as told by your health care provider.  Take your antibiotic as told by your health care provider. Do not stop taking the antibiotic even if you start to feel better.  Have family members who also have a sore throat or fever tested for strep throat. They may need antibiotics if they have the strep infection. Eating and drinking   Do not share food, drinking cups, or personal items that could cause the infection to spread to other people.  If swallowing is difficult, try eating soft foods until your sore throat feels better.  Drink enough fluid to keep your urine clear or pale yellow. General instructions   Gargle with a salt-water mixture 3-4 times per day or as needed. To make a salt-water mixture, completely dissolve -1 tsp of salt in 1 cup of warm water.  Make sure that all household members wash their hands well.  Get plenty of rest.  Stay home from school or work until you have been taking antibiotics for 24 hours.  Keep all follow-up visits as told by your health care provider. This is important. Contact a health care provider if:  The glands in your neck continue to get bigger.  You develop a rash, cough,  or earache.  You cough up a thick liquid that is green, yellow-brown, or bloody.  You have pain or discomfort that does not get better with medicine.  Your problems seem to be getting worse rather than better.  You have a fever. Get help right away if:  You have new symptoms, such as vomiting, severe headache, stiff or painful neck, chest pain, or shortness of breath.  You have severe throat pain, drooling, or changes in your voice.  You have swelling of the neck,  or the skin on the neck becomes red and tender.  You have signs of dehydration, such as fatigue, dry mouth, and decreased urination.  You become increasingly sleepy, or you cannot wake up completely.  Your joints become red or painful. This information is not intended to replace advice given to you by your health care provider. Make sure you discuss any questions you have with your health care provider. Document Released: 01/06/2000 Document Revised: 09/07/2015 Document Reviewed: 05/03/2014 Elsevier Interactive Patient Education  2017 [employer].

## 2016-04-24 NOTE — Progress Notes (Signed)
Chief Complaint  Patient presents with  . Annual Exam    fasting annual exam with pap. Scheduled for eye exam next week at [facility]. No concerns.     Caitlin Duncan is a 26 y.o. female who presents for a complete physical.  She has the following concerns:  Seen recently with rash, found to also have strep pharyngitis. She was treated with a course of amoxil. No further rashes, denies sore throat.  Obesity:  She exercises regularly, but hasn't been able to lose weight.  She only eats 1 meal/day  Vitamin D deficiency:   Last check was 10/2015 and level was low at 17. She was treated with an additional 12 weeks of rx, and advised to take 5000 IU daily. She has been taking 5000 IU daily since completing the last prescription.  Due for recheck today.  Contraceptive management-- She is using condoms regularly. (previously took OCP's--caused migraines). She isn't interested in trying any other form of contraception at this time. H/o chlamydia in the past. Monogamous relationship x 4 years.  Immunization History  Administered Date(s) Administered  . HPV Quadrivalent 03/13/2010, 06/06/2010, 03/14/2011  . PPD Test 10/25/2014  . Tdap 04/13/2013  refuses flu shots Last Pap smear: h/o abnl 03/2013--pap smear results showed ASCUS and high risk HPV, as well as a yeast infection.Pap smear was normal in 2016 and 2017. Last mammogram: never  Last colonoscopy: never  Last DEXA: never  Dentist: twice yearly Ophtho: yearly, wears glasses/contacts, scheduled for next week Exercise: gym 3x/week (M/W/F)--treadmill, bicycle and elliptical (30 minutes), one weight machine, and does dumbbells at the gym, and jumprope, and walks 2x week, 20 minutes.  Lipids: Lab Results  Component Value Date   CHOL 163 03/14/2011   HDL 72 03/14/2011   LDLCALC 79 03/14/2011   TRIG 60 03/14/2011   CHOLHDL 2.3 03/14/2011    Past Medical History:  Diagnosis Date  . Anemia   . ASCUS with positive high risk  HPV 03/2013  . Chlamydia 06/2013  . Dysmenorrhea    resolved  . Vitamin D deficiency 03/2013    History reviewed. No pertinent surgical history.  Social History   Social History  . Marital status: Single    Spouse name: N/A  . Number of children: N/A  . Years of education: N/A   Occupational History  . Not on file.   Social History Main Topics  . Smoking status: Never Smoker  . Smokeless tobacco: Never Used  . Alcohol use No  . Drug use: No  . Sexual activity: Yes    Partners: Male    Birth control/ protection: Condom   Other Topics Concern  . Not on file   Social History Narrative   Graduated from [employer] (criminal justice) . Lives alone.  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 May move to [place]. 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 Hypertension Mother   . Kidney failure Mother     kidney transplant 2014 (related to pregnancy)  . Hypertension Sister   . Diabetes Neg Hx   . Cancer Neg Hx   . Heart disease Neg Hx     Outpatient Encounter Prescriptions as of 04/25/2016  Medication Sig  . ferrous sulfate 325 (65 FE) MG tablet Take 325 mg by mouth daily with breakfast.  . Multiple Vitamin (MULTIVITAMIN) tablet Take 1 tablet by mouth daily.  . [DISCONTINUED] Cholecalciferol (VITAMIN D)  2000 units tablet Take 2,000 Units by mouth daily.  . [DISCONTINUED] amoxicillin (AMOXIL) 875 MG tablet Take 1 tablet (875 mg total) by mouth 2 (two) times daily.   No facility-administered encounter medications on file as of 04/25/2016.     No Known Allergies   ROS: The patient denies anorexia, fever, vision changes, decreased hearing, ear pain, sore throat, breast concerns, chest pain, palpitations, dizziness, syncope, dyspnea on exertion, cough, swelling, diarrhea, constipation, abdominal pain, melena, hematochezia, hematuria, incontinence, dysuria, irregular menstrual cycles, vaginal discharge,  odor, itch or menstrual cramps; no genital lesions, joint pains, numbness, tingling, weakness, tremor, suspicious skin lesions, depression, anxiety, abnormal bleeding/bruising, or enlarged lymph nodes. Menses are regular, heavy for the first 2 days only. Headaches with cycle (mild, not a migraine). Trouble losing weight   PHYSICAL EXAM:  BP 114/70 (BP Location: Right Arm, Patient Position: Sitting, Cuff Size: Normal)   Pulse 60   Ht  (1.676 m)   Wt 237 lb 9.6 oz (107.8 kg)   LMP 04/17/2016 (Exact Date)   BMI 38.35 kg/m   Wt Readings from Last 3 Encounters:  04/05/16 236 lb 9.6 oz (107.3 kg)  12/07/15 236 lb 9.6 oz (107.3 kg)  08/24/15 228 lb 9.6 oz (103.7 kg)   232 at CPE last year  General Appearance:  Alert, cooperative, no distress, appears stated age   Head:  Normocephalic, without obvious abnormality, atraumatic   Eyes:  PERRL, conjunctiva/corneas clear, EOM's intact, fundi benign   Ears:  Normal TM's and external ear canals   Nose:  Nares normal, mucosa is moderately edematous, R>L, no drainage or sinus tenderness   Throat:  Lips, mucosa, and tongue normal; teeth and gums normal. Scar on central portion of tongue. Cobblestoning posteriorly  Neck:  Supple, no lymphadenopathy; thyroid: no enlargement/tenderness/nodules; no carotid bruit or JVD   Back:  Spine nontender, no curvature, ROM normal, no CVA tenderness   Lungs:  Clear to auscultation bilaterally without wheezes, rales or ronchi; respirations unlabored   Chest Wall:  No tenderness or deformity   Heart:  Regular rate and rhythm, S1 and S2 normal, no murmur, rub or gallop   Breast Exam:  No tenderness, masses, or nipple discharge or inversion. No axillary lymphadenopathy. Significant fibrocystic changes in upper outer quadrants bilaterally. No discrete mass   Abdomen:  Soft, non-tender, nondistended, normoactive bowel sounds,  no masses, no hepatosplenomegaly    Genitalia:  Normal external genitalia without lesions. BUS and vagina normal; cervix without lesions, or cervical motion tenderness. Small amount of slightly thick white discharge. Uterus and adnexa not enlarged, nontender, no masses. Pap performed. Small skin tag on left labia majora  Rectal:  Not performed due to age<40 and no related complaints   Extremities:  No clubbing, cyanosis or edema   Pulses:  2+ and symmetric all extremities   Skin:  Skin color, texture, turgor normal, no rashes or lesions. Small striae on upper back. +tattoos.   Lymph nodes:  Cervical, supraclavicular, and axillary nodes normal   Neurologic:  CNII-XII intact, normal strength, sensation and gait; reflexes 2+ and symmetric throughout    Psych:  Normal mood, affect, hygiene and grooming.   ASSESSMENT/PLAN:  Annual physical exam - Plan: POCT Urinalysis Dipstick, CBC with Differential/Platelet, VITAMIN D 25 Hydroxy (Vit-D Deficiency, Fractures), Glucose, random, TSH, Cytology - PAP [place]  Vitamin D deficiency - Plan: VITAMIN D 25 Hydroxy (Vit-D Deficiency, Fractures)  Obesity (BMI 35.0-39.9 without comorbidity) - Plan: Glucose, random  Weight gain - Plan:  TSH   Pap Vit D, glu, CBC  Discussed monthly self breast exams and yearly mammograms after the age of 10; at least 30 minutes of aerobic activity at least 5 days/week; proper sunscreen use reviewed; healthy diet, including goals of calcium and vitamin D intake and alcohol recommendations (less than or equal to 1 drink/day) reviewed; regular seatbelt use; changing batteries in smoke detectors. Immunization recommendations discussed--flu shots recommended yearly (she refuses, recommended)  Encouraged weight loss. Counseled re: proper diet, not skipping meals, increasing cardio, healthy food choices.  To consider medications, nutritionist.    Please increase your aerobic exercise a little--minimum should be  150 minutes/week.  Get a little more cardio at the gym, or a longer walk on the other 2 days.   Do not skip meals. You need to eat at least 1200 calories/day. Eat small meals/snacks frequently, doesn't have to be large meals. Eat more fruits/vegetables, and cut back on your carbs (breads, rice, pasta, potatoes).    Be sure to pack snacks with you, especially to eat after work and before going to the gym.  Consider seeing nutritionist to help come up with a diet plan, and/or medications.  If interested in medications, set up another visit to discuss. You may want to check with your insurance to see if there are preferred weight loss medications (Qsymia, Belviq, Contrave, Saxenda).

## 2016-04-25 ENCOUNTER — Other Ambulatory Visit (HOSPITAL_COMMUNITY)
Admission: RE | Admit: 2016-04-25 | Discharge: 2016-04-25 | Disposition: A | Discharge status: Home / Self Care | Payer: 59 | Source: Ambulatory Visit | Attending: Family Medicine | Admitting: Family Medicine

## 2016-04-25 ENCOUNTER — Encounter: Payer: Self-pay | Admitting: Family Medicine

## 2016-04-25 ENCOUNTER — Ambulatory Visit (INDEPENDENT_AMBULATORY_CARE_PROVIDER_SITE_OTHER): Payer: 59 | Admitting: Family Medicine

## 2016-04-25 VITALS — BP 114/70 | HR 60 | Ht 66.0 in | Wt 237.6 lb

## 2016-04-25 DIAGNOSIS — Z0000 Encounter for general adult medical examination without abnormal findings: Secondary | ICD-10-CM

## 2016-04-25 DIAGNOSIS — E669 Obesity, unspecified: Secondary | ICD-10-CM

## 2016-04-25 DIAGNOSIS — R635 Abnormal weight gain: Secondary | ICD-10-CM | POA: Diagnosis not present

## 2016-04-25 DIAGNOSIS — E559 Vitamin D deficiency, unspecified: Secondary | ICD-10-CM

## 2016-04-25 LAB — POCT URINALYSIS DIPSTICK
BILIRUBIN UA: NEGATIVE
Blood, UA: NEGATIVE
Glucose, UA: NEGATIVE
Ketones, UA: NEGATIVE
LEUKOCYTES UA: NEGATIVE
NITRITE UA: NEGATIVE
PH UA: 7 (ref 5.0–8.0)
PROTEIN UA: NEGATIVE
Spec Grav, UA: 1.02 (ref 1.030–1.035)
Urobilinogen, UA: NEGATIVE (ref ?–2.0)

## 2016-04-25 LAB — CBC WITH DIFFERENTIAL/PLATELET
BASOS ABS: 0 {cells}/uL (ref 0–200)
BASOS PCT: 0 %
Eosinophils Absolute: 49 cells/uL (ref 15–500)
Eosinophils Relative: 1 %
HCT: 36.8 % (ref 35.0–45.0)
Hemoglobin: 12 g/dL (ref 11.7–15.5)
LYMPHS ABS: 2352 {cells}/uL (ref 850–3900)
Lymphocytes Relative: 48 %
MCH: 28 pg (ref 27.0–33.0)
MCHC: 32.6 g/dL (ref 32.0–36.0)
MCV: 85.8 fL (ref 80.0–100.0)
MONOS PCT: 9 %
MPV: 11.9 fL (ref 7.5–12.5)
Monocytes Absolute: 441 cells/uL (ref 200–950)
NEUTROS ABS: 2058 {cells}/uL (ref 1500–7800)
Neutrophils Relative %: 42 %
PLATELETS: 242 10*3/uL (ref 140–400)
RBC: 4.29 MIL/uL (ref 3.80–5.10)
RDW: 14.8 % (ref 11.0–15.0)
WBC: 4.9 10*3/uL (ref 4.0–10.5)

## 2016-04-25 LAB — TSH: TSH: 0.53 m[IU]/L

## 2016-04-25 LAB — GLUCOSE, RANDOM: GLUCOSE: 87 mg/dL (ref 65–99)

## 2016-04-25 NOTE — Patient Instructions (Signed)
  HEALTH MAINTENANCE RECOMMENDATIONS:  It is recommended that you get at least 30 minutes of aerobic exercise at least 5 days/week (for weight loss, you may need as much as 60-90 minutes). This can be any activity that gets your heart rate up. This can be divided in 10-15 minute intervals if needed, but try and build up your endurance at least once a week.  Weight bearing exercise is also recommended twice weekly.  Eat a healthy diet with lots of vegetables, fruits and fiber.  "Colorful" foods have a lot of vitamins (ie green vegetables, tomatoes, red peppers, etc).  Limit sweet tea, regular sodas and alcoholic beverages, all of which has a lot of calories and sugar.  Up to 1 alcoholic drink daily may be beneficial for women (unless trying to lose weight, watch sugars).  Drink a lot of water.  Calcium recommendations are 1200-1500 mg daily (1500 mg for postmenopausal women or women without ovaries), and vitamin D 1000 IU daily.  This should be obtained from diet and/or supplements (vitamins), and calcium should not be taken all at once, but in divided doses.  Monthly self breast exams and yearly mammograms for women over the age of 63 is recommended.  Sunscreen of at least SPF 30 should be used on all sun-exposed parts of the skin when outside between the hours of 10 am and 4 pm (not just when at beach or pool, but even with exercise, golf, tennis, and yard work!)  Use a sunscreen that says "broad spectrum" so it covers both UVA and UVB rays, and make sure to reapply every 1-2 hours.  Remember to change the batteries in your smoke detectors when changing your clock times in the spring and fall.  Use your seat belt every time you are in a car, and please drive safely and not be distracted with cell phones and texting while driving.   Please increase your aerobic exercise a little--minimum should be 150 minutes/week.  Get a little more cardio at the gym, or a longer walk on the other 2 days.   Do  not skip meals. You need to eat at least 1200 calories/day. Eat small meals/snacks frequently, doesn't have to be large meals. Eat more fruits/vegetables, and cut back on your carbs (breads, rice, pasta, potatoes).    Be sure to pack snacks with you, especially to eat after work and before going to the gym.  Consider seeing nutritionist to help come up with a diet plan, and/or medications.  If interested in medications, set up another visit to discuss. You may want to check with your insurance to see if there are preferred weight loss medications (Qsymia, Belviq, Contrave, Saxenda).

## 2016-04-26 LAB — VITAMIN D 25 HYDROXY (VIT D DEFICIENCY, FRACTURES): VIT D 25 HYDROXY: 22 ng/mL — AB (ref 30–100)

## 2016-04-26 MED ORDER — VITAMIN D (ERGOCALCIFEROL) 1.25 MG (50000 UNIT) PO CAPS: 50000.0000 [IU] | ORAL_CAPSULE | ORAL | 0 refills | Status: DC

## 2016-04-26 NOTE — Addendum Note (Signed)
Addended by: Joselyn Arrow on: 04/26/2016 07:29 AM   Modules accepted: Orders

## 2016-05-01 LAB — CYTOLOGY - PAP
CHLAMYDIA, DNA PROBE: NEGATIVE
Diagnosis: NEGATIVE

## 2016-05-14 ENCOUNTER — Telehealth: Payer: Self-pay | Admitting: Family Medicine

## 2016-05-14 ENCOUNTER — Ambulatory Visit (INDEPENDENT_AMBULATORY_CARE_PROVIDER_SITE_OTHER): Payer: 59 | Admitting: Family Medicine

## 2016-05-14 ENCOUNTER — Encounter: Payer: Self-pay | Admitting: Family Medicine

## 2016-05-14 VITALS — BP 130/84 | HR 84 | Temp 99.9°F | Ht 66.0 in | Wt 239.2 lb

## 2016-05-14 DIAGNOSIS — J029 Acute pharyngitis, unspecified: Secondary | ICD-10-CM | POA: Diagnosis not present

## 2016-05-14 LAB — POCT RAPID STREP A (OFFICE): RAPID STREP A SCREEN: NEGATIVE

## 2016-05-14 NOTE — Telephone Encounter (Signed)
She presented with a rash, we diagnosed the throat issue. I'd prefer her to be seen for evaluation.

## 2016-05-14 NOTE — Progress Notes (Signed)
Chief Complaint  Patient presents with  . Sore Throat    very sore, sees white pockets.    4 days ago she started with a cough, then started getting some throat irritation 3 days ago.  This morning she noticed white bumps in her throat.  This morning her throat hurt.  Salt water gargles helped.   Not coughing much, just a slight tickle.  Not productive.  No nasal drainage.  Denies any allergy symptoms.    Not aware of any fever, chills. She woke up with a headache this morning, a "migraine", over her left eye. It is much milder now, didn't take any meds for it. Denies nausea, vomiting, diarrhea. No known sick contacts.  She was seen 3/15 with complaint of rash. She had been sick the week prior, had noted white bumps in throat earlier that week, and was found at her visit to have +strep.  She was treated with amoxil and symptoms resolved (white spot on throat, rash), seen earlier this month for physical without ongoing concerns.  PMH, PSH, SH reviewed Using condoms for contraception  Outpatient Encounter Prescriptions as of 05/14/2016  Medication Sig  . Cholecalciferol (VITAMIN D3) 5000 units CAPS Take 1 capsule by mouth daily.  . ferrous sulfate 325 (65 FE) MG tablet Take 325 mg by mouth daily with breakfast.  . Multiple Vitamin (MULTIVITAMIN) tablet Take 1 tablet by mouth daily.  . Vitamin D, Ergocalciferol, (DRISDOL) 50000 units CAPS capsule Take 1 capsule (50,000 Units total) by mouth every 7 (seven) days.   No facility-administered encounter medications on file as of 05/14/2016.    No Known Allergies  ROS:  No known fever, chills.  +headache today.  No ear pain, nausea, vomiting, diarrhea, abdominal pain, shortness of breath, chest pain, bleeding, bruising, rash.  See HPI.  PHYSICAL EXAM:  BP 130/84 (BP Location: Right Arm, Patient Position: Sitting, Cuff Size: Normal)   Pulse 84   Temp 99.9 F (37.7 C) (Tympanic)   Ht  (1.676 m)   Wt 239 lb 3.2 oz (108.5 kg)   LMP  04/17/2016 (Exact Date)   BMI 38.61 kg/m   Well appearing, pleasant female in no distress HEENT: conjunctiva and sclera are clear. TM's and EAC's normal.  Nose--mod edema, clear-white mucus.  Sinuses nontender.  Tonsils are not enlarged, no erythema, but there is a large white spot on the left. Rest of OP normal Neck: slightly tender over very small anterior cervical lymph node on the left.  No other significant lymphadenopathy Heart: regular rate and rhythm Lungs: clear bilaterally Skin: normal turgor, no rash Psych: normal mood, affect, hygiene and grooming   Rapid strep negative   ASSESSMENT/PLAN:   Sore throat - with white spot L tonsil, but otherwise not inflamed. LG temp. Suspect viral URI. Confirmatory test to r/o strep. Supportive measures - Plan: Rapid Strep A, Strep A DNA probe

## 2016-05-14 NOTE — Telephone Encounter (Signed)
Pt called and stated that she thinks her throat issue has returned. She states that she now has a sore throat. She is requesting another round of the medication she received in March. Pt uses [facility] and can be reached at [phone].

## 2016-05-14 NOTE — Patient Instructions (Signed)
Please contact us if you develop new symptoms (higher fevers, rash). We will contact you with the strep confirmatory test, and if positive, will start you on antibiotics.  The rapid strep test in the office was negative. If negative, I suspect this is a viral illness.  Continue supportive measures until it resolves--drink plenty of water, tylenol as needed for pain or fever, salt water gargles, chloraseptic spray as needed for throat pain.

## 2016-05-15 ENCOUNTER — Encounter: Payer: Self-pay | Admitting: Family Medicine

## 2016-05-15 LAB — STREP A DNA PROBE: GASP: NOT DETECTED

## 2016-07-18 ENCOUNTER — Other Ambulatory Visit: Payer: Self-pay | Admitting: Family Medicine

## 2016-07-18 DIAGNOSIS — E559 Vitamin D deficiency, unspecified: Secondary | ICD-10-CM

## 2016-07-31 ENCOUNTER — Telehealth: Payer: Self-pay

## 2016-07-31 NOTE — Telephone Encounter (Signed)
Faxed refill request rcvd for Vit D 50,000 units. Per April 2018 labs, pt was only to take 50,000 units weekly for 12 week, then resume 5000 units daily thereafter.  Rx denied, [facility]. Attempted call to pt, no VCM- no answer. Caitlin Duncan/RLB

## 2017-03-20 ENCOUNTER — Ambulatory Visit (INDEPENDENT_AMBULATORY_CARE_PROVIDER_SITE_OTHER): Payer: 59 | Admitting: Family Medicine

## 2017-03-20 ENCOUNTER — Encounter: Payer: Self-pay | Admitting: Family Medicine

## 2017-03-20 VITALS — BP 108/70 | HR 80 | Temp 98.7°F | Ht 66.0 in | Wt 238.6 lb

## 2017-03-20 DIAGNOSIS — J101 Influenza due to other identified influenza virus with other respiratory manifestations: Secondary | ICD-10-CM | POA: Diagnosis not present

## 2017-03-20 DIAGNOSIS — R197 Diarrhea, unspecified: Secondary | ICD-10-CM

## 2017-03-20 DIAGNOSIS — R6883 Chills (without fever): Secondary | ICD-10-CM

## 2017-03-20 DIAGNOSIS — R05 Cough: Secondary | ICD-10-CM

## 2017-03-20 DIAGNOSIS — R059 Cough, unspecified: Secondary | ICD-10-CM

## 2017-03-20 LAB — POC INFLUENZA A&B (BINAX/QUICKVUE)
Influenza A, POC: POSITIVE — AB
Influenza B, POC: NEGATIVE

## 2017-03-20 NOTE — Patient Instructions (Signed)
  Drink plenty of water. Use a decongestant (ie sudafed type medication) to help dry up the nasal drainage and postnasal drip, and help with sinus pain. Use guaifenesin (ie Mucinex) which is an expectorant to loosen up the mucus/phlegm. You can continue the delsym if needed for cough supression, and aleve for fever or pain. You can also use tylenol for fever/pain if needed.  Avoid dairy for at least 5-7 days, and eat bland foods such as bananas, rice, applesauce, toast.  When appetite it better, add in baked chicken. Avoid fried, greasy, acidic or spicy foods until your stomach is better. You can use imodium if needed for diarrhea.  Contact us right away if you develop blood in the stool, worsening abdominal pain, fever    Influenza, Adult Influenza ("the flu") is an infection in the lungs, nose, and throat (respiratory tract). It is caused by a virus. The flu causes many common cold symptoms, as well as a high fever and body aches. It can make you feel very sick. The flu spreads easily from person to person (is contagious). Getting a flu shot (influenza vaccination) every year is the best way to prevent the flu. Follow these instructions at home:  Take over-the-counter and prescription medicines only as told by your doctor.  Use a cool mist humidifier to add moisture (humidity) to the air in your home. This can make it easier to breathe.  Rest as needed.  Drink enough fluid to keep your pee (urine) clear or pale yellow.  Cover your mouth and nose when you cough or sneeze.  Wash your hands with soap and water often, especially after you cough or sneeze. If you cannot use soap and water, use hand sanitizer.  Stay home from work or school as told by your doctor. Unless you are visiting your doctor, try to avoid leaving home until your fever has been gone for 24 hours without the use of medicine.  Keep all follow-up visits as told by your doctor. This is important. How is this  prevented?  Getting a yearly (annual) flu shot is the best way to avoid getting the flu. You may get the flu shot in late summer, fall, or winter. Ask your doctor when you should get your flu shot.  Wash your hands often or use hand sanitizer often.  Avoid contact with people who are sick during cold and flu season.  Eat healthy foods.  Drink plenty of fluids.  Get enough sleep.  Exercise regularly. Contact a doctor if:  You get new symptoms.  You have: ? Chest pain. ? Watery poop (diarrhea). ? A fever.  Your cough gets worse.  You start to have more mucus.  You feel sick to your stomach (nauseous).  You throw up (vomit). Get help right away if:  You start to be short of breath or have trouble breathing.  Your skin or nails turn a bluish color.  You have very bad pain or stiffness in your neck.  You get a sudden headache.  You get sudden pain in your face or ear.  You cannot stop throwing up. This information is not intended to replace advice given to you by your health care provider. Make sure you discuss any questions you have with your health care provider. Document Released: 10/18/2007 Document Revised: 06/16/2015 Document Reviewed: 11/02/2014 Elsevier Interactive Patient Education  2017 [employer]

## 2017-03-20 NOTE — Progress Notes (Signed)
Chief Complaint  Patient presents with  . Cough    x 6 days. Had fever Monday and took Aleve but it went away. Runny nose, can't sleep. Throat hurts, not hungry, ear hurt today, HA and cold sweats.    2/21 she started with a cough, then 2/22 she developed headache, runny nose, diarrhea, body aches;  Started with fever (over 100, doesn't recall exactly) on 2/25.  She has had persistent cough, and runny nose/PND.  Nasal drainage is clear.  Phlegm is yellow.  Denies shortness of breath, tightness, just some wheezing only while coughing.  She had some right ear pain since yesterday. Persistent diarrhea also.  Slight abdominal pain yesterday, resolved. Sore throat--hurts to swallow anything other than water, soup.  She has been taking delsym and aleve. No other OTC meds  +sick flu contacts at work, and niece with URI/allergies  PMH, PSH, SH reviewed  Outpatient Encounter Medications as of 03/20/2017  Medication Sig Note  . Cholecalciferol (VITAMIN D3) 5000 units CAPS Take 1 capsule by mouth daily.   . ferrous sulfate 325 (65 FE) MG tablet Take 325 mg by mouth daily with breakfast.   . Multiple Vitamin (MULTIVITAMIN) tablet Take 1 tablet by mouth daily.   . naproxen sodium (ALEVE) 220 MG tablet Take 660 mg by mouth daily as needed. 03/20/2017: Last doe at 8:30am  . [DISCONTINUED] Vitamin D, Ergocalciferol, (DRISDOL) 50000 units CAPS capsule Take 1 capsule (50,000 Units total) by mouth every 7 (seven) days.    No facility-administered encounter medications on file as of 03/20/2017.    No Known Allergies  ROS:  Fever and URI symptoms per HPI. No vomiting, slight nausea (just urge to vomit with coughing). She continues to have diarrhea--watery. Denies blood/mucus.  Currently no abdominal pain.  Yesterday she had some burning with urination, none today.  She has been drinking some milk.  Some muscular back pain on the left.   PHYSICAL EXAM:  BP 108/70   Pulse 80   Temp 98.7 F (37.1 C)  (Tympanic)   Ht 5\' 6"  (1.676 m)   Wt 238 lb 9.6 oz (108.2 kg)   LMP 02/25/2017 (Exact Date)   BMI 38.51 kg/m   Well appearing, pleasant female with frequent sniffling and occasional dry cough. Speaking easily, in no distress HEENT: PERRL, EOMI, conjunctiva and sclera are clear. TMs and EAC's normal (minimal effusion on the right, no erythema or bulging). Nasal mucosa is moderately-severely edematous, +erythema, clear mucus. Sinuses are nontender. OP has significant cobblestoning posteriorly, otherwise normal. Neck: no lymphadenopathy or mass Heart: regular rate and rhythm Lungs: clear bilaterally Abdomen: active bowel sounds, soft, nontender, no mass Extremities: no edema Skin: normal turgor, no rash Neuro: alert and oriented, cranial nerves intact, normal strength, gait  Influenza A+  ASSESSMENT/PLAN:  Influenza A - supportive measures reviewed. no e/o bacterial infection. Too late for Tamiflu benefit  Cough - Plan: POC Influenza A&B (Binax test)  Chills - Plan: POC Influenza A&B (Binax test)  Diarrhea of presumed infectious origin - BRAT diet, avoid dairy, imodium prn.   Drink plenty of water. Use a decongestant (ie sudafed type medication) to help dry up the nasal drainage and postnasal drip, and help with sinus pain. Use guaifenesin (ie Mucinex) which is an expectorant to loosen up the mucus/phlegm. You can continue the delsym if needed for cough supression, and aleve for fever or pain. You can also use tylenol for fever/pain if needed.  Avoid dairy for at least 5-7 days, and eat  bland foods such as bananas, rice, applesauce, toast.  When appetite it better, add in baked chicken. Avoid fried, greasy, acidic or spicy foods until your stomach is better. You can use imodium if needed for diarrhea.  Contact us right away if you develop blood in the stool, worsening abdominal pain, fever

## 2017-05-07 NOTE — Progress Notes (Signed)
Chief Complaint  Patient presents with  . Annual Exam    fasting annual exam with pap-no eye exam [facility] and is going next week. Will try to give UA on way out-no symptoms. Had a charley horse last night and wanted to know how to treat or prevent.    Caitlin Duncan is a 27 y.o. female who presents for a complete physical.  She has the following concerns:  She has been having some bilateral low back pain, comes/goes. Thinks related to standing a lot.  Doesn't hurt every day.  No radiation into buttocks or legs, but sometimes will go up the back, and sometimes some neck pain.  Charley horse-in right calf --it cramped up last night while sleeping.  Hasn't had it any other times (last was years ago).  She drinks a lot of water.  [place] [facility] [place] change in activity.  Much better now.  Obesity:  She now eats breakfast (used to only eat 1x/day), and lunch, sometimes doesn't eat dinner because she gets home late, and not hungry.  She will eat something (granola bar) or snacks at work. She exercises 3x/week.  Vitamin D deficiency:  Last check was still low at 22 in 04/2016.  She was treated with another 12 weeks of prescription replacement, and to resume 5000 IU daily.   Due for recheck today. Compliant with 5000 IU daily.  Contraceptive management--She is using condoms regularly.  (previously took OCP's--caused migraines). She isn't interested in trying any other form of contraception at this time. H/o chlamydia in the past. Monogamous relationship x 5 years.  Immunization History  Administered Date(s) Administered  . HPV Quadrivalent 03/13/2010, 06/06/2010, 03/14/2011  . PPD Test 10/25/2014  . Tdap 04/13/2013   refuses flu shots (but will re-consider, since she got the flu this year) Last Pap smear: 03/2016 normal. h/o abnl 03/2013--pap smear results showed ASCUS and high risk HPV, as well as a yeast infection.Pap smear was normal in 2016,  2017 and 2018. Last mammogram: never  Last colonoscopy: never  Last DEXA: never  Dentist: twice yearly Ophtho: yearly, wears glasses/contacts Exercise: exercises at work gym 2x/week --treadmill 30" or walking or jumprope, and once on the weekends.  No longer lifts weights, but walks while holding weights.  Lipids: Lab Results  Component Value Date   CHOL 163 03/14/2011   HDL 72 03/14/2011   LDLCALC 79 03/14/2011   TRIG 60 03/14/2011   CHOLHDL 2.3 03/14/2011   Denies any change in diet.  Past Medical History:  Diagnosis Date  . Anemia   . ASCUS with positive high risk HPV 03/2013  . Chlamydia 06/2013  . Dysmenorrhea    resolved  . Vitamin D deficiency 03/2013    History reviewed. No pertinent surgical history.  Social History   Socioeconomic History  . Marital status: Single    Spouse name: Not on file  . Number of children: Not on file  . Years of education: Not on file  . Highest education level: Not on file  Occupational History  . Not on file  Social Needs  . Financial resource strain: Not on file  . Food insecurity:    Worry: Not on file    Inability: Not on file  . Transportation needs:    Medical: Not on file    Non-medical: Not on file  Tobacco Use  . Smoking status: Never Smoker  . Smokeless tobacco: Never Used  Substance and Sexual Activity  . Alcohol use: Yes    Alcohol/week: 0.6 oz    Types: 1 Glasses of wine per week    Comment: 4-5 oz of wine/week  . Drug use: No  . Sexual activity: Yes    Partners: Male    Birth control/protection: Condom  Lifestyle  . Physical activity:    Days per week: Not on file    Minutes per session: Not on file  . Stress: Not on file  Relationships  . Social connections:    Talks on phone: Not on file    Gets together: Not on file    Attends religious service: Not on file    Active member of club or organization: Not on file    Attends meetings of clubs or organizations: Not on file    Relationship  status: Not on file  . Intimate partner violence:    Fear of current or ex partner: Not on file    Emotionally abused: Not on file    Physically abused: Not on file    Forced sexual activity: Not on file  Other Topics Concern  . Not on file  Social History Narrative   Lives alone. No pets.   Family lives in [place].   Graduated from [employer] (criminal justice) . Lives alone.  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 May move to [place]. Applied for jobs in [place] (law enforcement)--got delayed due to death in the family, but still planning to do this.   Updated 04/2017    Family History  Problem Relation Age of Onset  . Hypertension Mother   . Kidney failure Mother        kidney transplant 2014 (related to pregnancy)  . Hypertension Sister   . Diabetes Neg Hx   . Cancer Neg Hx   . Heart disease Neg Hx     Outpatient Encounter Medications as of 05/08/2017  Medication Sig Note  . Cholecalciferol (VITAMIN D3) 5000 units CAPS Take 1 capsule by mouth daily.   . ferrous sulfate 325 (65 FE) MG tablet Take 325 mg by mouth daily with breakfast.   . Multiple Vitamin (MULTIVITAMIN) tablet Take 1 tablet by mouth daily.   . naproxen sodium (ALEVE) 220 MG tablet Take 660 mg by mouth daily as needed. 03/20/2017: Last doe at 8:30am   No facility-administered encounter medications on file as of 05/08/2017.    No Known Allergies  ROS: The patient denies anorexia, fever, vision changes, decreased hearing, ear pain, sore throat, breast concerns, chest pain, palpitations, dizziness, syncope, dyspnea on exertion, cough, swelling, diarrhea, constipation, abdominal pain, melena, hematochezia, hematuria, incontinence, dysuria, irregular menstrual cycles, vaginal discharge, odor, itch or menstrual cramps; no genital lesions, joint pains, numbness, tingling, weakness, tremor, suspicious skin lesions, depression, anxiety, abnormal bleeding/bruising, or  enlarged lymph nodes. Menses are regular, heavy for the first 2 days only. Headaches with cycle (mild, not a migraine). Had some congestion, took sudafed yesterday. Low back pain per HPI (not daily), and charley horse in R calf last night only. See HPI   PHYSICAL EXAM:  BP 120/80   Pulse 76   Ht 5\' 7"  (1.702 m)   Wt 244 lb 3.2 oz (110.8 kg)   LMP 04/15/2017 (Exact Date)   BMI 38.25 kg/m   Wt Readings from Last 3 Encounters:  05/08/17 244 lb 3.2 oz (110.8 kg)  03/20/17 238 lb 9.6 oz (108.2 kg)  05/14/16 239 lb 3.2  oz (108.5 kg)   General Appearance:  Alert, cooperative, no distress, appears stated age   Head:  Normocephalic, without obvious abnormality, atraumatic   Eyes:  PERRL, conjunctiva/corneas clear, EOM's intact, fundi benign   Ears:  Normal TM's and external ear canals   Nose:  Nares normal, mucosa is mild-moderately edematous, L>R, no drainage or sinus tenderness   Throat:  Lips, mucosa, and tongue normal; teeth and gums normal. Scar on central portion of tongue.   Neck:  Supple, no lymphadenopathy; thyroid: no enlargement/tenderness/ nodules; no carotid bruit or JVD   Back:  Spine nontender, no curvature, ROM normal, no CVA tenderness; some tightness at lumbar paraspinous muscles, no spasm, nontender   Lungs:  Clear to auscultation bilaterally without wheezes, rales or ronchi; respirations unlabored   Chest Wall:  No tenderness or deformity   Heart:  Regular rate and rhythm, S1 and S2 normal, no murmur, rub or gallop   Breast Exam:  No tenderness, masses, or nipple discharge or inversion. No axillary lymphadenopathy. Significant fibrocystic changes in upper outer quadrants bilaterally, L>R, nontender. No discrete mass   Abdomen:  Soft, non-tender, nondistended, normoactive bowel sounds, no masses, no hepatosplenomegaly   Genitalia:  Normal external genitalia without lesions. BUS and vagina normal; cervix without lesions,  or cervical motion tenderness. Small amount of  thick white discharge. Uterus and adnexa not enlarged, nontender, no masses. Pap performed. Small skin tag on left labia majora. Tender and slightly inflamed area at right upper thigh, below groin (likely early abscess/ingrown hair) --tender, but not significantly red, not indurated, no fluctuance  Rectal:  Not performed due to age<40 and no related complaints   Extremities:  No clubbing, cyanosis or edema   Pulses:  2+ and symmetric all extremities   Skin:  Skin color, texture, turgor normal, no rashes or lesions. Small striae on upper back. +tattoos.   Lymph nodes:  Cervical, supraclavicular, and axillary nodes normal   Neurologic:  CNII-XII intact, normal strength, sensation and gait; reflexes 2+ and symmetric throughout    Psych:  Normal mood, affect, hygiene and grooming.    ASSESSMENT/PLAN:  Annual physical exam - Plan: Glucose, random, VITAMIN D 25 Hydroxy (Vit-D Deficiency, Fractures), Cytology - PAP([place])  Vitamin D deficiency - recheck level. continue daily supplement 5000 IU - Plan: VITAMIN D 25 Hydroxy (Vit-D Deficiency, Fractures)  Obesity (BMI 35.0-39.9 without comorbidity) - counseled re: diet, portions, exercise, risks of obesity. Wt loss encouraged - Plan: Glucose, random   Discussed plan B, proper use of condoms.  Discussed monthly self breast exams and yearly mammograms after the age of 4; at least 30 minutes of aerobic activity at least 5 days/week; proper sunscreen use reviewed; healthy diet, including goals of calcium and vitamin D intake and alcohol recommendations (less than or equal to 1 drink/day) reviewed; regular seatbelt use; changing batteries in smoke detectors. Immunization recommendations discussed--flu shots recommended yearly (shewill consider next year, given that she got the flu this year).   Pharmacy is [facility] if needed for add'l D  rx

## 2017-05-08 ENCOUNTER — Encounter: Payer: Self-pay | Admitting: Family Medicine

## 2017-05-08 ENCOUNTER — Ambulatory Visit (INDEPENDENT_AMBULATORY_CARE_PROVIDER_SITE_OTHER): Payer: 59 | Admitting: Family Medicine

## 2017-05-08 ENCOUNTER — Other Ambulatory Visit (HOSPITAL_COMMUNITY)
Admission: RE | Admit: 2017-05-08 | Discharge: 2017-05-08 | Disposition: A | Discharge status: Home / Self Care | Payer: 59 | Source: Ambulatory Visit | Attending: Family Medicine | Admitting: Family Medicine

## 2017-05-08 VITALS — BP 120/80 | HR 76 | Ht 67.0 in | Wt 244.2 lb

## 2017-05-08 DIAGNOSIS — E669 Obesity, unspecified: Secondary | ICD-10-CM

## 2017-05-08 DIAGNOSIS — Z0000 Encounter for general adult medical examination without abnormal findings: Secondary | ICD-10-CM | POA: Diagnosis present

## 2017-05-08 DIAGNOSIS — E559 Vitamin D deficiency, unspecified: Secondary | ICD-10-CM

## 2017-05-08 NOTE — Patient Instructions (Addendum)
HEALTH MAINTENANCE RECOMMENDATIONS:  It is recommended that you get at least 30 minutes of aerobic exercise at least 5 days/week (for weight loss, you may need as much as 60-90 minutes). This can be any activity that gets your heart rate up. This can be divided in 10-15 minute intervals if needed, but try and build up your endurance at least once a week.  Weight bearing exercise is also recommended twice weekly.  Eat a healthy diet with lots of vegetables, fruits and fiber.  "Colorful" foods have a lot of vitamins (ie green vegetables, tomatoes, red peppers, etc).  Limit sweet tea, regular sodas and alcoholic beverages, all of which has a lot of calories and sugar.  Up to 1 alcoholic drink daily may be beneficial for women (unless trying to lose weight, watch sugars).  Drink a lot of water.  Calcium recommendations are 1200-1500 mg daily (1500 mg for postmenopausal women or women without ovaries), and vitamin D 1000 IU daily.  This should be obtained from diet and/or supplements (vitamins), and calcium should not be taken all at once, but in divided doses.  Monthly self breast exams and yearly mammograms for women over the age of 8 is recommended.  Sunscreen of at least SPF 30 should be used on all sun-exposed parts of the skin when outside between the hours of 10 am and 4 pm (not just when at beach or pool, but even with exercise, golf, tennis, and yard work!)  Use a sunscreen that says "broad spectrum" so it covers both UVA and UVB rays, and make sure to reapply every 1-2 hours.  Remember to change the batteries in your smoke detectors when changing your clock times in the spring and fall.  Use your seat belt every time you are in a car, and please drive safely and not be distracted with cell phones and texting while driving.    MyPlate from USDA The general, healthful diet is based on the 2010 Dietary Guidelines for Americans. The amount of food you need to eat from each food group depends  on your age, sex, and level of physical activity and can be individualized by a dietitian. Go to https://www.[place].org/ for more information. What do I need to know about the MyPlate plan?  Enjoy your food, but eat less.  Avoid oversized portions. ?  of your plate should include fruits and vegetables. ?  of your plate should be grains. ?  of your plate should be protein. Grains  Make at least half of your grains whole grains.  For a 2,000 calorie daily food plan, eat 6 oz every day.  1 oz is about 1 slice bread, 1 cup cereal, or  cup cooked rice, cereal, or pasta. Vegetables  Make half your plate fruits and vegetables.  For a 2,000 calorie daily food plan, eat 2 cups every day.  1 cup is about 1 cup raw or cooked vegetables or vegetable juice or 2 cups raw leafy greens. Fruits  Make half your plate fruits and vegetables.  For a 2,000 calorie daily food plan, eat 2 cups every day.  1 cup is about 1 cup fruit or 100% fruit juice or  cup dried fruit. Protein  For a 2,000 calorie daily food plan, eat 5 oz every day.  1 oz is about 1 oz meat, poultry, or fish,  cup cooked beans, 1 egg, 1 Tbsp peanut butter, or  oz nuts or seeds. Dairy  Switch to fat-free or low-fat (1%) milk.  For  a 2,000 calorie daily food plan, eat 3 cups every day.  1 cup is about 1 cup milk or yogurt or soy milk (soy beverage), 1 oz natural cheese, or 2 oz processed cheese. Fats, Oils, and Empty Calories  Only small amounts of oils are recommended.  Empty calories are calories from solid fats or added sugars.  Compare sodium in foods like soup, bread, and frozen meals. Choose the foods with lower numbers.  Drink water instead of sugary drinks. What foods can I eat? Grains Whole grains such as whole wheat, quinoa, millet, and bulgur. Bread, rolls, and pasta made from whole grains. Brown or wild rice. Hot or cold cereals made from whole grains and without added sugar. Vegetables All fresh  vegetables, especially fresh red, dark green, or orange vegetables. Peas and beans. Low-sodium frozen or canned vegetables prepared without added salt. Low-sodium vegetable juices. Fruits All fresh, frozen, and dried fruits. Canned fruit packed in water or fruit juice without added sugar. Fruit juices without added sugar. Meats and Other Protein Sources Boiled, baked, or grilled lean meat trimmed of fat. Skinless poultry. Fresh seafood and shellfish. Canned seafood packed in water. Unsalted nuts and unsalted nut butters. Tofu. Dried beans and pea. Eggs. Dairy Low-fat or fat-free milk, yogurt, and cheeses. Sweets and Desserts Frozen desserts made from low-fat milk. Fats and Oils Olive, peanut, and canola oils and margarine. Salad dressing and mayonnaise made from these oils. Other Soups and casseroles made from allowed ingredients and without added fat or salt. The items listed above may not be a complete list of recommended foods or beverages. Contact your dietitian for more options. What foods are not recommended? Grains Sweetened, low-fiber cereals. Packaged baked goods. Snack crackers and chips. Cheese crackers, butter crackers, and biscuits. Frozen waffles, sweet breads, doughnuts, pastries, packaged baking mixes, pancakes, cakes, and cookies. Vegetables Regular canned or frozen vegetables or vegetables prepared with salt. Canned tomatoes. Canned tomato sauce. Fried vegetables. Vegetables in cream sauce or cheese sauce. Fruits Fruits packed in syrup or made with added sugar. Meats and Other Protein Sources Marbled or fatty meats such as ribs. Poultry with skin. Fried meats, poultry, eggs, or fish. Sausages, hot dogs, and deli meats such as pastrami, bologna, or salami. Dairy Whole milk, cream, cheeses made from whole milk, sour cream. Ice cream or yogurt made from whole milk or with added sugar. Beverages For adults, no more than one alcoholic drink per day. Regular soft drinks or other  sugary beverages. Juice drinks. Sweets and Desserts Sugary or fatty desserts, candy, and other sweets. Fats and Oils Solid shortening or partially hydrogenated oils. Solid margarine. Margarine that contains trans fats. Butter. The items listed above may not be a complete list of foods and beverages to avoid. Contact your dietitian for more information. This information is not intended to replace advice given to you by your health care provider. Make sure you discuss any questions you have with your health care provider. Document Released: 01/28/2007 Document Revised: 06/16/2015 Document Reviewed: 12/17/2012 Elsevier Interactive Patient Education  2018 [employer].   Back Pain, Adult Many adults have back pain from time to time. Common causes of back pain include:  A strained muscle or ligament.  Wear and tear (degeneration) of the spinal disks.  Arthritis.  A hit to the back.  Back pain can be short-lived (acute) or last a long time (chronic). A physical exam, lab tests, and imaging studies may be done to find the cause of your pain. Follow these  instructions at home: Managing pain and stiffness  Take over-the-counter and prescription medicines only as told by your health care provider.  If directed, apply heat to the affected area as often as told by your health care provider. Use the heat source that your health care provider recommends, such as a moist heat pack or a heating pad. ? Place a towel between your skin and the heat source. ? Leave the heat on for 20-30 minutes. ? Remove the heat if your skin turns bright red. This is especially important if you are unable to feel pain, heat, or cold. You have a greater risk of getting burned.  If directed, apply ice to the injured area: ? Put ice in a plastic bag. ? Place a towel between your skin and the bag. ? Leave the ice on for 20 minutes, 2-3 times a day for the first 2-3 days. Activity  Do not stay in bed. Resting more  than 1-2 days can delay your recovery.  Take short walks on even surfaces as soon as you are able. Try to increase the length of time you walk each day.  Do not sit, drive, or stand in one place for more than 30 minutes at a time. Sitting or standing for long periods of time can put stress on your back.  Use proper lifting techniques. When you bend and lift, use positions that put less stress on your back: ? [place] your knees. ? Keep the load close to your body. ? Avoid twisting.  Exercise regularly as told by your health care provider. Exercising will help your back heal faster. This also helps prevent back injuries by keeping muscles strong and flexible.  Your health care provider may recommend that you see a physical therapist. This person can help you come up with a safe exercise program. Do any exercises as told by your physical therapist. Lifestyle  Maintain a healthy weight. Extra weight puts stress on your back and makes it difficult to have good posture.  Avoid activities or situations that make you feel anxious or stressed. Learn ways to manage anxiety and stress. One way to manage stress is through exercise. Stress and anxiety increase muscle tension and can make back pain worse. General instructions  Sleep on a firm mattress in a comfortable position. Try lying on your side with your knees slightly bent. If you lie on your back, put a pillow under your knees.  Follow your treatment plan as told by your health care provider. This may include: ? Cognitive or behavioral therapy. ? Acupuncture or massage therapy. ? Meditation or yoga. Contact a health care provider if:  You have pain that is not relieved with rest or medicine.  You have increasing pain going down into your legs or buttocks.  Your pain does not improve in 2 weeks.  You have pain at night.  You lose weight.  You have a fever or chills. Get help right away if:  You develop new bowel or bladder control  problems.  You have unusual weakness or numbness in your arms or legs.  You develop nausea or vomiting.  You develop abdominal pain.  You feel faint. Summary  Many adults have back pain from time to time. A physical exam, lab tests, and imaging studies may be done to find the cause of your pain.  Use proper lifting techniques. When you bend and lift, use positions that put less stress on your back.  Take over-the-counter and prescription medicines and apply  heat or ice as directed by your health care provider. This information is not intended to replace advice given to you by your health care provider. Make sure you discuss any questions you have with your health care provider. Document Released: 01/08/2005 Document Revised: 02/13/2016 Document Reviewed: 02/13/2016 Elsevier Interactive Patient Education  [employer]2018 Else[employer] Inc.

## 2017-05-09 LAB — VITAMIN D 25 HYDROXY (VIT D DEFICIENCY, FRACTURES): VIT D 25 HYDROXY: 18.7 ng/mL — AB (ref 30.0–100.0)

## 2017-05-09 LAB — CYTOLOGY - PAP
Chlamydia: NEGATIVE
DIAGNOSIS: NEGATIVE

## 2017-05-09 LAB — GLUCOSE, RANDOM: GLUCOSE: 83 mg/dL (ref 65–99)

## 2017-05-09 MED ORDER — VITAMIN D (ERGOCALCIFEROL) 1.25 MG (50000 UNIT) PO CAPS: 50000.0000 [IU] | ORAL_CAPSULE | ORAL | 0 refills | Status: DC

## 2017-05-09 NOTE — Addendum Note (Signed)
Addended by: Joselyn ArrowKNAPP, Phillip Sandler on: 05/09/2017 07:26 AM   Modules accepted: Orders

## 2017-09-25 ENCOUNTER — Encounter: Payer: Self-pay | Admitting: Family Medicine

## 2017-09-25 ENCOUNTER — Ambulatory Visit (INDEPENDENT_AMBULATORY_CARE_PROVIDER_SITE_OTHER): Payer: 59 | Admitting: Family Medicine

## 2017-09-25 VITALS — BP 118/74 | HR 80 | Temp 98.6°F | Ht 67.0 in | Wt 243.0 lb

## 2017-09-25 DIAGNOSIS — L02415 Cutaneous abscess of right lower limb: Secondary | ICD-10-CM

## 2017-09-25 MED ORDER — DOXYCYCLINE HYCLATE 100 MG PO TABS: 100.0000 mg | ORAL_TABLET | Freq: Two times a day (BID) | ORAL | 0 refills | Status: DC

## 2017-09-25 NOTE — Patient Instructions (Signed)
Use warm compresses to the area of abscess frequently throughout the day. This will help the area drain the pus, which will make it feel much better. Take the antibiotics twice daily for 10 days. Contact us if you have any kind of side effect. If it isn't draining on its own, and pain persists/worsens, you will need to return for the boil to be drained   Skin Abscess A skin abscess is an infected area on or under your skin that contains a collection of pus and other material. An abscess may also be called a furuncle, carbuncle, or boil. An abscess can occur in or on almost any part of your body. Some abscesses break open (rupture) on their own. Most continue to get worse unless they are treated. The infection can spread deeper into the body and eventually into your blood, which can make you feel ill. Treatment usually involves draining the abscess. What are the causes? An abscess occurs when germs, often bacteria, pass through your skin and cause an infection. This may be caused by:  A scrape or cut on your skin.  A puncture wound through your skin, including a needle injection.  Blocked oil or sweat glands.  Blocked and infected hair follicles.  A cyst that forms beneath your skin (sebaceous cyst) and becomes infected.  What increases the risk? This condition is more likely to develop in people who:  Have a weak body defense system (immune system).  Have diabetes.  Have dry and irritated skin.  Get frequent injections or use illegal IV drugs.  Have a foreign body in a wound, such as a splinter.  Have problems with their lymph system or veins.  What are the signs or symptoms? An abscess may start as a painful, firm bump under the skin. Over time, the abscess may get larger or become softer. Pus may appear at the top of the abscess, causing pressure and pain. It may eventually break through the skin and drain. Other symptoms  include:  Redness.  Warmth.  Swelling.  Tenderness.  A sore on the skin.  How is this diagnosed? This condition is diagnosed based on your medical history and a physical exam. A sample of pus may be taken from the abscess to find out what is causing the infection and what antibiotics can be used to treat it. You also may have:  Blood tests to look for signs of infection or spread of an infection to your blood.  Imaging studies such as ultrasound, CT scan, or MRI if the abscess is deep.  How is this treated? Small abscesses that drain on their own may not need treatment. Treatment for an abscess that does not rupture on its own may include:  Warm compresses applied to the area several times per day.  Incision and drainage. Your health care provider will make an incision to open the abscess and will remove pus and any foreign body or dead tissue. The incision area may be packed with gauze to keep it open for a few days while it heals.  Antibiotic medicines to treat infection. For a severe abscess, you may first get antibiotics through an IV and then change to oral antibiotics.  Follow these instructions at home: Abscess Care  If you have an abscess that has not drained, place a warm, clean, wet washcloth over the abscess several times a day. Do this as told by your health care provider.  Follow instructions from your health care provider about how to take  care of your abscess. Make sure you: ? Cover the abscess with a bandage (dressing). ? Change your dressing or gauze as told by your health care provider. ? Wash your hands with soap and water before you change the dressing or gauze. If soap and water are not available, use hand sanitizer.  Check your abscess every day for signs of a worsening infection. Check for: ? More redness, swelling, or pain. ? More fluid or blood. ? Warmth. ? More pus or a bad smell. Medicines  Take over-the-counter and prescription medicines only  as told by your health care provider.  If you were prescribed an antibiotic medicine, take it as told by your health care provider. Do not stop taking the antibiotic even if you start to feel better. General instructions  To avoid spreading the infection: ? Do not share personal care items, towels, or hot tubs with others. ? Avoid making skin contact with other people.  Keep all follow-up visits as told by your health care provider. This is important. Contact a health care provider if:  You have more redness, swelling, or pain around your abscess.  You have more fluid or blood coming from your abscess.  Your abscess feels warm to the touch.  You have more pus or a bad smell coming from your abscess.  You have a fever.  You have muscle aches.  You have chills or a general ill feeling. Get help right away if:  You have severe pain.  You see red streaks on your skin spreading away from the abscess. This information is not intended to replace advice given to you by your health care provider. Make sure you discuss any questions you have with your health care provider. Document Released: 10/18/2004 Document Revised: 09/04/2015 Document Reviewed: 11/17/2014 Elsevier Interactive Patient Education  [employer].

## 2017-09-25 NOTE — Progress Notes (Signed)
Chief Complaint  Patient presents with  . Recurrent Skin Infections    right innner thigh x 8 days. Drained a little yesterday morning, but not completely. Very painful.   . Flu Vaccine    will get at work.   It started as a small bump at her right groin, which got bigger and more painful. She applied hot compresses.  4 days ago she got a Boil-ease cream, which helped ease off some of the pain. Yesterday morning it drained some, and bled some, but it is still there and painful. She can't wear pants. She had a fever the first day, none since.  Uses condoms. Just finished period, denies pregnancy.  PMH, PSH, SH reviewed  Outpatient Encounter Medications as of 09/25/2017  Medication Sig Note  . Cholecalciferol (VITAMIN D3) 5000 units CAPS Take 1 capsule by mouth daily.   . ferrous sulfate 325 (65 FE) MG tablet Take 325 mg by mouth daily with breakfast.   . Multiple Vitamin (MULTIVITAMIN) tablet Take 1 tablet by mouth daily.   . Vitamin D, Ergocalciferol, (DRISDOL) 50000 units CAPS capsule Take 1 capsule (50,000 Units total) by mouth every 7 (seven) days.   Marland Kitchen doxycycline (VIBRA-TABS) 100 MG tablet Take 1 tablet (100 mg total) by mouth 2 (two) times daily.   . naproxen sodium (ALEVE) 220 MG tablet Take 660 mg by mouth daily as needed. 03/20/2017: Last doe at 8:30am   No facility-administered encounter medications on file as of 09/25/2017.    (doxy rx'd today, not taking prior to visit)  No Known Allergies  ROS: no further fever, no chills, nausea, vomiting, rashes, URI or other concerns except as noted in HPI   PHYSICAL EXAM:  BP 118/74   Pulse 80   Temp 98.6 F (37 C) (Tympanic)   Ht 5\' 7"  (1.702 m)   Wt 243 lb (110.2 kg)   LMP 09/18/2017 (Exact Date)   BMI 38.06 kg/m   Well appearing, pleasant female, in good spirits, in no distress Right upper medial thigh: 5 x 3 cm area of hyperpigmentation, raised, very tender, indurated at edges with some fluctuance (very superficially)  centrally.  Very sensitive to touch and intolerant of much exam/palpitation. Denuded area in the center measures 1.5cm Remainder of skin is clear   ASSESSMENT/PLAN:  Abscess of right thigh - Plan: doxycycline (VIBRA-TABS) 100 MG tablet  She declines I&D today.  Prefers to continue with warm compresses and get further drainage at home, along with antibiotics.  She knows to return in the next few days if not draining, for I&D    Use warm compresses to the area of abscess frequently throughout the day. This will help the area drain the pus, which will make it feel much better. Take the antibiotics twice daily for 10 days. Contact us if you have any kind of side effect. If it isn't draining on its own, and pain persists/worsens, you will need to return for the boil to be drained

## 2017-10-16 ENCOUNTER — Other Ambulatory Visit: Payer: Self-pay | Admitting: Family Medicine

## 2017-10-16 DIAGNOSIS — E559 Vitamin D deficiency, unspecified: Secondary | ICD-10-CM

## 2017-10-16 NOTE — Telephone Encounter (Signed)
Refill not appropriate. She should have completed 12 week course. Remind her to take MVI daily, plus 5000 IU OTC D3 once daily, and continue these longterm.

## 2017-12-04 ENCOUNTER — Telehealth: Payer: Self-pay | Admitting: Family Medicine

## 2017-12-04 NOTE — Telephone Encounter (Signed)
Pt canceled CPE via mychart  visit for April 23/2020 states she has moved, removed dr knapp as pcp

## 2017-12-31 ENCOUNTER — Other Ambulatory Visit (HOSPITAL_COMMUNITY)
Admission: RE | Admit: 2017-12-31 | Discharge: 2017-12-31 | Disposition: A | Discharge status: Home / Self Care | Payer: 59 | Source: Ambulatory Visit | Attending: Obstetrics and Gynecology | Admitting: Obstetrics and Gynecology

## 2017-12-31 ENCOUNTER — Encounter: Payer: Self-pay | Admitting: Obstetrics and Gynecology

## 2017-12-31 ENCOUNTER — Ambulatory Visit (INDEPENDENT_AMBULATORY_CARE_PROVIDER_SITE_OTHER): Payer: 59 | Admitting: Obstetrics and Gynecology

## 2017-12-31 VITALS — BP 129/84 | HR 79 | Wt 243.0 lb

## 2017-12-31 DIAGNOSIS — N9089 Other specified noninflammatory disorders of vulva and perineum: Secondary | ICD-10-CM

## 2017-12-31 NOTE — Progress Notes (Signed)
NGYN pt presents for problem visit today. C/o: skin tag/mole on left side of vaginal area.  Pt wants second opinion, pt was told by PCP there was nothing alarming about the area  However pt states the mole/tag has since grew 3/10x pain Pt states she has noticed the tag x 2 yrs now.    Last pap and AEX : 05/08/2017

## 2017-12-31 NOTE — Progress Notes (Signed)
27 yo G1P0010 with BMI 38 here for the evaluation of a skin tag. Patient states skin tag has been present for the past 2 years and has grown in size. She reports some occasional pain from the area and denies any pruritis. Patient is otherwise without complaints  Past Medical History:  Diagnosis Date  . Anemia   . ASCUS with positive high risk HPV 03/2013  . Chlamydia 06/2013  . Dysmenorrhea    resolved  . Vitamin D deficiency 03/2013   No past surgical history on file. Family History  Problem Relation Age of Onset  . Hypertension Mother   . Kidney failure Mother        kidney transplant 2014 (related to pregnancy)  . Hypertension Sister   . Diabetes Neg Hx   . Cancer Neg Hx   . Heart disease Neg Hx    Social History   Tobacco Use  . Smoking status: Never Smoker  . Smokeless tobacco: Never Used  Substance Use Topics  . Alcohol use: Yes    Alcohol/week: 1.0 standard drinks    Types: 1 Glasses of wine per week    Comment: 4-5 oz of wine/week  . Drug use: No   ROS See pertinent in HPI Blood pressure 129/84, pulse 79, weight 243 lb (110.2 kg), last menstrual period 12/29/2017.  GENERAL: Well-developed, well-nourished female in no acute distress.  ABDOMEN: Soft, nontender, nondistended. No organomegaly. PELVIC: Normal external female genitalia with a 1 cm pedunculated skin tag on inferior aspect of left labia majora EXTREMITIES: No cyanosis, clubbing, or edema, 2+ distal pulses.  A/P 27 yo with left skin tag - Discussed removal of skin tag and patient agrees.  Skin underlying skin tag was injected with 2 cc lidocaine. The skin tag was removed using a scalpel. Hemostasis was achieved with 2.0 Vicryl. The patient tolerated the procedure well - Follow up with PCP as needed

## 2018-02-12 ENCOUNTER — Ambulatory Visit (INDEPENDENT_AMBULATORY_CARE_PROVIDER_SITE_OTHER): Payer: 59 | Admitting: Family Medicine

## 2018-02-12 ENCOUNTER — Encounter: Payer: Self-pay | Admitting: Family Medicine

## 2018-02-12 VITALS — BP 120/72 | HR 68 | Ht 67.0 in | Wt 242.0 lb

## 2018-02-12 DIAGNOSIS — B353 Tinea pedis: Secondary | ICD-10-CM | POA: Diagnosis not present

## 2018-02-12 DIAGNOSIS — B373 Candidiasis of vulva and vagina: Secondary | ICD-10-CM

## 2018-02-12 DIAGNOSIS — N898 Other specified noninflammatory disorders of vagina: Secondary | ICD-10-CM | POA: Diagnosis not present

## 2018-02-12 DIAGNOSIS — B3731 Acute candidiasis of vulva and vagina: Secondary | ICD-10-CM

## 2018-02-12 LAB — POCT WET PREP (WET MOUNT)
Clue Cells Wet Prep Whiff POC: NEGATIVE
Trichomonas Wet Prep HPF POC: ABSENT

## 2018-02-12 MED ORDER — FLUCONAZOLE 150 MG PO TABS: 150.0000 mg | ORAL_TABLET | Freq: Once | ORAL | 0 refills | Status: AC

## 2018-02-12 NOTE — Progress Notes (Signed)
Chief Complaint  Patient presents with  . Vaginitis    white, itchy discharge that began Friday and worsened Monday. Has taken diflucan in the past.    Patient presents with complaint of possible yeast infection. She is in a monogamous relationship, no STD concerns. Using condoms for contraception. Changed soaps to Dial, but denies any other skin irritation.  She tried Vagisil for irritation (not the one for yeast), helped slightly. Today her itching and discharge are worse. First noticed irritation/itching 5 days ago, worse today. Denies any fishy odor.  Discharge has been thick and white. Friday, when symptoms first noted, she had some dysuria, and slight pelvic discomfort.  The discomfort and dysuria resolved.  Denies any recent antibiotics.  Last yeast infection was after she had wisdom teeth removed and had antibiotics.  Diflucan was effective.  At the end of her visit she asked about a foot doctor referral (feet were not examined). She reports having a discolored/splitting toenail. She also describes having peeling between her does, some rash. She admits that her feet sweat a lot.   PMH, PSH, SH reviewed   Outpatient Encounter Medications as of 02/12/2018  Medication Sig Note  . Cholecalciferol (VITAMIN D3) 5000 units CAPS Take 1 capsule by mouth daily.   . ferrous sulfate 325 (65 FE) MG tablet Take 325 mg by mouth daily with breakfast.   . Multiple Vitamin (MULTIVITAMIN) tablet Take 1 tablet by mouth daily.   . naproxen sodium (ALEVE) 220 MG tablet Take 660 mg by mouth daily as needed. 03/20/2017: Last doe at 8:30am  . [DISCONTINUED] doxycycline (VIBRA-TABS) 100 MG tablet Take 1 tablet (100 mg total) by mouth 2 (two) times daily. (Patient not taking: Reported on 12/31/2017)   . [DISCONTINUED] Vitamin D, Ergocalciferol, (DRISDOL) 50000 units CAPS capsule Take 1 capsule (50,000 Units total) by mouth every 7 (seven) days. (Patient not taking: Reported on 12/31/2017)    No  facility-administered encounter medications on file as of 02/12/2018.    No Known Allergies  ROS: no fever, chills, no further dysuria, pelvic pain.  Discharge per HPI. No nausea, vomiting, bowel changes, rashes or other concerns  PHYSICAL EXAM:  BP 120/72   Pulse 68   Ht 5\' 7"  (1.702 m)   Wt 242 lb (109.8 kg)   BMI 37.90 kg/m  LMP 01/23/2018  Well appearing, pleasant female, in good spirits, in no distress Abdomen: soft, nontender GU: external exam normal, some white discharge present. Cervix appear normal.  There is moderate white discharge in vagina, only mildly thick. No odor.  Wet prep: No PMNs Mod hyphae No clue cells, no bacteria, no trich   ASSESSMENT/PLAN:  Yeast vaginitis - treat with diflucan; if ineffective, to try OTC Monistat. - Plan: fluconazole (DIFLUCAN) 150 MG tablet  Vaginal discharge - Plan: POCT Wet Prep ([place]et [place])  Tinea pedis, unspecified laterality - feet not examined, rash between toes mentioned at end of visit. Reviewed OTC antifungals, measures to keep feet dry    If you don't get complete relief from the diflucan, try an over-the-counter Monistat. If you develop fever, pelvic pain, discolored discharge, please get evaluated right away.  Try using over-the-counter medication for athlete's foot (clotrimazole or lamisil), twice daily for 2-3 weeks.  Apply this to the affected area on the feet, and between the toes.  Be sure to keep your feet DRY.   Wants to see podiatrist for possible toe fungus. Will let us know if referral is needed

## 2018-02-12 NOTE — Patient Instructions (Signed)
If you don't get complete relief from the diflucan, try an over-the-counter Monistat. If you develop fever, pelvic pain, discolored discharge, please get evaluated right away.  Try using over-the-counter medication for athlete's foot (clotrimazole or lamisil), twice daily for 2-3 weeks.  Apply this to the affected area on the feet, and between the toes.  Be sure to keep your feet DRY.    Vaginal Yeast infection, Adult  Vaginal yeast infection is a condition that causes vaginal discharge as well as soreness, swelling, and redness (inflammation) of the vagina. This is a common condition. Some women get this infection frequently. What are the causes? This condition is caused by a change in the normal balance of the yeast (candida) and bacteria that live in the vagina. This change causes an overgrowth of yeast, which causes the inflammation. What increases the risk? The condition is more likely to develop in women who:  Take antibiotic medicines.  Have diabetes.  Take birth control pills.  Are pregnant.  Douche often.  Have a weak body defense system (immune system).  Have been taking steroid medicines for a long time.  Frequently wear tight clothing. What are the signs or symptoms? Symptoms of this condition include:  White, thick, creamy vaginal discharge.  Swelling, itching, redness, and irritation of the vagina. The lips of the vagina (vulva) may be affected as well.  Pain or a burning feeling while urinating.  Pain during sex. How is this diagnosed? This condition is diagnosed based on:  Your medical history.  A physical exam.  A pelvic exam. Your health care provider will examine a sample of your vaginal discharge under a microscope. Your health care provider may send this sample for testing to confirm the diagnosis. How is this treated? This condition is treated with medicine. Medicines may be over-the-counter or prescription. You may be told to use one or more of  the following:  Medicine that is taken by mouth (orally).  Medicine that is applied as a cream (topically).  Medicine that is inserted directly into the vagina (suppository). Follow these instructions at home:  Lifestyle  Do not have sex until your health care provider approves. Tell your sex partner that you have a yeast infection. That person should go to his or her health care provider and ask if they should also be treated.  Do not wear tight clothes, such as pantyhose or tight pants.  Wear breathable cotton underwear. General instructions  Take or apply over-the-counter and prescription medicines only as told by your health care provider.  Eat more yogurt. This may help to keep your yeast infection from returning.  Do not use tampons until your health care provider approves.  Try taking a sitz bath to help with discomfort. This is a warm water bath that is taken while you are sitting down. The water should only come up to your hips and should cover your buttocks. Do this 3-4 times per day or as told by your health care provider.  Do not douche.  If you have diabetes, keep your blood sugar levels under control.  Keep all follow-up visits as told by your health care provider. This is important. Contact a health care provider if:  You have a fever.  Your symptoms go away and then return.  Your symptoms do not get better with treatment.  Your symptoms get worse.  You have new symptoms.  You develop blisters in or around your vagina.  You have blood coming from your vagina and it  is not your menstrual period.  You develop pain in your abdomen. Summary  Vaginal yeast infection is a condition that causes discharge as well as soreness, swelling, and redness (inflammation) of the vagina.  This condition is treated with medicine. Medicines may be over-the-counter or prescription.  Take or apply over-the-counter and prescription medicines only as told by your health  care provider.  Do not douche. Do not have sex or use tampons until your health care provider approves.  Contact a health care provider if your symptoms do not get better with treatment or your symptoms go away and then return. This information is not intended to replace advice given to you by your health care provider. Make sure you discuss any questions you have with your health care provider. Document Released: 10/18/2004 Document Revised: 05/27/2017 Document Reviewed: 05/27/2017 Elsevier Interactive Patient Education  2019 [employer]   Athlete's Foot  Athlete's foot (tinea pedis) is a fungal infection of the skin on your feet. It often occurs on the skin that is between or underneath your toes. It can also occur on the soles of your feet. Symptoms include itchy or white and flaky areas on the skin. The infection can spread from person to person (is contagious). It can also spread when a person's bare feet come in contact with the fungus on shower floors or on items such as shoes. Follow these instructions at home: Medicines  Apply or take over-the-counter and prescription medicines only as told by your doctor.  Apply your antifungal medicine as told by your doctor. Do not stop using the medicine even if your feet start to get better. Foot care  Do not scratch your feet.  Keep your feet dry: ? Wear cotton or wool socks. Change your socks every day or if they become wet. ? Wear shoes that allow air to move around, such as sandals or canvas tennis shoes.  Wash and dry your feet: ? Every day or as told by your doctor. ? After exercising. ? Including the area between your toes. General instructions  Do not share any of these items that touch your feet: ? Towels. ? Shoes. ? Nail clippers. ? Other personal items.  Protect your feet by wearing sandals in wet areas, such as locker rooms and shared showers.  Keep all follow-up visits as told by your doctor. This is  important.  If you have diabetes, keep your blood sugar under control. Contact a doctor if:  You have a fever.  You have swelling, pain, warmth, or redness in your foot.  Your feet are not getting better with treatment.  Your symptoms get worse.  You have new symptoms. Summary  Athlete's foot is a fungal infection of the skin on your feet.  Symptoms include itchy or white and flaky areas on the skin.  Apply your antifungal medicine as told by your doctor.  Keep your feet clean and dry. This information is not intended to replace advice given to you by your health care provider. Make sure you discuss any questions you have with your health care provider. Document Released: 06/27/2007 Document Revised: 10/29/2016 Document Reviewed: 10/29/2016 Elsevier Interactive Patient Education  2019 [employer].

## 2018-02-18 ENCOUNTER — Telehealth: Payer: Self-pay | Admitting: Family Medicine

## 2018-02-18 NOTE — Telephone Encounter (Signed)
LVM for pt to call the office and reschedule their appt that was scheduled for 04/08/18 with Dr. Stallings. Due to a provider being out of the office, pt will need to reschedule. When pt calls back, please reschedule at their convenience. Thank you! °

## 2018-02-20 ENCOUNTER — Ambulatory Visit (INDEPENDENT_AMBULATORY_CARE_PROVIDER_SITE_OTHER): Payer: 59 | Admitting: Podiatry

## 2018-02-20 VITALS — BP 139/91 | HR 66

## 2018-02-20 DIAGNOSIS — L600 Ingrowing nail: Secondary | ICD-10-CM

## 2018-02-20 DIAGNOSIS — B351 Tinea unguium: Secondary | ICD-10-CM

## 2018-02-20 LAB — HEPATIC FUNCTION PANEL
AG Ratio: 1.2 (calc) (ref 1.0–2.5)
ALT: 10 U/L (ref 6–29)
AST: 11 U/L (ref 10–30)
Albumin: 3.8 g/dL (ref 3.6–5.1)
Alkaline phosphatase (APISO): 64 U/L (ref 33–115)
BILIRUBIN INDIRECT: 0.2 mg/dL (ref 0.2–1.2)
Bilirubin, Direct: 0.1 mg/dL (ref 0.0–0.2)
GLOBULIN: 3.1 g/dL (ref 1.9–3.7)
TOTAL PROTEIN: 6.9 g/dL (ref 6.1–8.1)
Total Bilirubin: 0.3 mg/dL (ref 0.2–1.2)

## 2018-02-20 MED ORDER — TERBINAFINE HCL 250 MG PO TABS: 250.0000 mg | ORAL_TABLET | Freq: Every day | ORAL | 0 refills | Status: DC

## 2018-02-20 MED ORDER — NEOMYCIN-POLYMYXIN-HC 3.5-10000-1 OT SOLN: OTIC | 0 refills | Status: DC

## 2018-02-20 NOTE — Patient Instructions (Signed)

## 2018-02-21 NOTE — Progress Notes (Signed)
Subjective:   Patient ID: Caitlin Duncan, female   DOB: 28 y.o.   MRN: [id number]   HPI Patient presents with long-term bunion deformities bilateral and ingrown toenail deformity of the left hallux and third toes that are painful and a thickened hallux nail right that is been this way for a long time.  Patient states that gradually become more symptomatic and patient has poor foot structure with history of this with her father.  Patient does not smoke and likes to be active   Review of Systems  All other systems reviewed and are negative.       Objective:  Physical Exam Vitals signs and nursing note reviewed.  Constitutional:      Appearance: She is well-developed.  Pulmonary:     Effort: Pulmonary effort is normal.  Musculoskeletal: Normal range of motion.  Skin:    General: Skin is warm.  Neurological:     Mental Status: She is alert.     Neurovascular status found to be intact muscle strength is adequate range of motion within normal limits.  Patient is noted to have incurvated medial border of the left hallux and the third toe left nail is very thickened and impossible for her to cut and patient is also found to have a very thickened right hallux which is moderately tender.  Patient has significant flattening of the arch bilateral with moderate structural bunion deformity and hammertoe deformity bilateral and was found to have good digital perfusion and well oriented x3     Assessment:  Combination of ingrown toenail deformity with mycotic nail infection and structural bunion hammertoe with structural flatfoot deformity     Plan:  H&P and reviewed all conditions with patient.  Today we get a focus on the ingrown toenails of the fungal element that she has and I went ahead and discussed removal of the nail border left hallux and removal of the third nail left.  Patient wants these procedures and read consent form understanding risk and signed consent form.  Today I went  ahead and infiltrated each digit 60 mg like Marcaine mixture sterile prep applied and using sterile instrumentation I remove the medial border of the left hallux and the left third nail exposed matrix and applied phenol 3 applications 30 seconds followed by alcohol lavage sterile dressing and gave instructions on dressing usage leaving it on 24 hours and taking it off earlier if throbbing were to occur.  Wrote a prescription for Corticosporin solution and encouraged to call with questions concerns.  Also discussed the fungal infection and placed her on antifungal Lamisil and did order liver function studies and discussed structural deformity and we will evaluate that the future.  Patient will be seen back in the next 4 to 6 weeks or earlier if needed and understands risk associated with antifungal therapy

## 2018-04-08 ENCOUNTER — Ambulatory Visit: Payer: 59 | Admitting: Family Medicine

## 2018-04-21 ENCOUNTER — Ambulatory Visit: Payer: 59 | Admitting: Family Medicine

## 2018-05-15 ENCOUNTER — Encounter: Payer: 59 | Admitting: Family Medicine

## 2018-06-18 ENCOUNTER — Ambulatory Visit: Payer: 59 | Admitting: Family Medicine

## 2018-06-19 ENCOUNTER — Ambulatory Visit: Payer: 59 | Admitting: Podiatry

## 2018-06-30 ENCOUNTER — Ambulatory Visit: Payer: 59 | Admitting: Family Medicine

## 2018-06-30 ENCOUNTER — Other Ambulatory Visit: Payer: Self-pay

## 2018-06-30 ENCOUNTER — Ambulatory Visit (INDEPENDENT_AMBULATORY_CARE_PROVIDER_SITE_OTHER): Payer: 59 | Admitting: Podiatry

## 2018-06-30 ENCOUNTER — Encounter: Payer: Self-pay | Admitting: Podiatry

## 2018-06-30 DIAGNOSIS — M2042 Other hammer toe(s) (acquired), left foot: Secondary | ICD-10-CM | POA: Diagnosis not present

## 2018-06-30 DIAGNOSIS — B351 Tinea unguium: Secondary | ICD-10-CM

## 2018-06-30 DIAGNOSIS — M21619 Bunion of unspecified foot: Secondary | ICD-10-CM

## 2018-06-30 DIAGNOSIS — M2041 Other hammer toe(s) (acquired), right foot: Secondary | ICD-10-CM

## 2018-07-02 ENCOUNTER — Telehealth: Payer: Self-pay | Admitting: Registered Nurse

## 2018-07-02 NOTE — Telephone Encounter (Signed)
Pt canceled new patient appt with Orland Mustard. Called pt to reschedule. Pt declined and said she would call back.

## 2018-07-02 NOTE — Progress Notes (Signed)
Subjective:   Patient ID: Caitlin Duncan, female   DOB: 28 y.o.   MRN: [id number]   HPI Patient presents stating that the fungus seems to be improved but she wanted to get it checked and she also knows she is getting need her bunions and hammertoes fixed at one point in future   ROS      Objective:  Physical Exam  Neurovascular status intact with nails which appear to be thinner from previous but still having good effect from the antifungal she took along with significant structural bunion deformity bilateral and digital deformities which are gradually getting worse with significant family history     Assessment:  Mycotic nail infection along with digital and bunion deformity bilateral     Plan:  H&P condition reviewed and recommended the continuation of conservative care and that we may do pulse antifungal in the future.  She was educated on her bunions and what would be required to know she needs to get them taken care of and is get a look at her schedule to decide when it is appropriate

## 2018-07-10 ENCOUNTER — Ambulatory Visit: Payer: 59 | Admitting: Registered Nurse

## 2018-10-24 ENCOUNTER — Ambulatory Visit (INDEPENDENT_AMBULATORY_CARE_PROVIDER_SITE_OTHER): Payer: 59 | Admitting: Family Medicine

## 2018-10-24 ENCOUNTER — Other Ambulatory Visit: Payer: Self-pay

## 2018-10-24 ENCOUNTER — Encounter: Payer: Self-pay | Admitting: Family Medicine

## 2018-10-24 VITALS — BP 120/80 | Temp 98.2°F | Wt 247.4 lb

## 2018-10-24 DIAGNOSIS — B373 Candidiasis of vulva and vagina: Secondary | ICD-10-CM

## 2018-10-24 DIAGNOSIS — N898 Other specified noninflammatory disorders of vagina: Secondary | ICD-10-CM | POA: Diagnosis not present

## 2018-10-24 DIAGNOSIS — Z113 Encounter for screening for infections with a predominantly sexual mode of transmission: Secondary | ICD-10-CM

## 2018-10-24 DIAGNOSIS — B3731 Acute candidiasis of vulva and vagina: Secondary | ICD-10-CM

## 2018-10-24 LAB — POCT WET PREP (WET MOUNT)
Clue Cells Wet Prep Whiff POC: NEGATIVE
Trichomonas Wet Prep HPF POC: ABSENT

## 2018-10-24 MED ORDER — FLUCONAZOLE 150 MG PO TABS: ORAL_TABLET | ORAL | 0 refills | Status: DC

## 2018-10-24 NOTE — Patient Instructions (Signed)
Fluconazole tablets What is this medicine? FLUCONAZOLE (floo KON na zole) is an antifungal medicine. It is used to treat certain kinds of fungal or yeast infections. This medicine may be used for other purposes; ask your health care provider or pharmacist if you have questions. COMMON BRAND NAME(S): Diflucan What should I tell my health care provider before I take this medicine? They need to know if you have any of these conditions:  history of irregular heart beat  kidney disease  an unusual or allergic reaction to fluconazole, other azole antifungals, medicines, foods, dyes, or preservatives  pregnant or trying to get pregnant  breast-feeding How should I use this medicine? Take this medicine by mouth. Follow the directions on the prescription label. Do not take your medicine more often than directed. Talk to your pediatrician regarding the use of this medicine in children. Special care may be needed. This medicine has been used in children as young as 6 months of age. Overdosage: If you think you have taken too much of this medicine contact a poison control center or emergency room at once. NOTE: This medicine is only for you. Do not share this medicine with others. What if I miss a dose? If you miss a dose, take it as soon as you can. If it is almost time for your next dose, take only that dose. Do not take double or extra doses. What may interact with this medicine? Do not take this medicine with any of the following medications:  astemizole  certain medicines for irregular heart beat like dronedarone, quinidine  cisapride  erythromycin  lomitapide  other medicines that prolong the QT interval (cause an abnormal heart rhythm)  pimozide  terfenadine  thioridazine  tolvaptan This medicine may also interact with the following medications:  antiviral medicines for HIV or AIDS  birth control pills  certain antibiotics like rifabutin, rifampin  certain medicines  for blood pressure like amlodipine, isradipine, felodipine, hydrochlorothiazide, losartan, nifedipine  certain medicines for cancer like cyclophosphamide, vinblastine, vincristine  certain medicines for cholesterol like atorvastatin, lovastatin, fluvastatin, simvastatin  certain medicines for depression, anxiety, or psychotic disturbances like amitriptyline, midazolam, nortriptyline, triazolam  certain medicines for diabetes like glipizide, glyburide, tolbutamide  certain medicines for pain like alfentanil, fentanyl, methadone  certain medicines for seizures like carbamazepine, phenytoin  certain medicines that treat or prevent blood clots like warfarin  dofetilide  halofantrine  medicines that lower your chance of fighting infection like cyclosporine, prednisone, tacrolimus  NSAIDS, medicines for pain and inflammation, like celecoxib, diclofenac, flurbiprofen, ibuprofen, meloxicam, naproxen  other medicines for fungal infections  sirolimus  theophylline  tofacitinib  ziprasidone This list may not describe all possible interactions. Give your health care provider a list of all the medicines, herbs, non-prescription drugs, or dietary supplements you use. Also tell them if you smoke, drink alcohol, or use illegal drugs. Some items may interact with your medicine. What should I watch for while using this medicine? Visit your doctor or health care professional for regular checkups. If you are taking this medicine for a long time you may need blood work. Tell your doctor if your symptoms do not improve. Some fungal infections need many weeks or months of treatment to cure. Alcohol can increase possible damage to your liver. Avoid alcoholic drinks. If you have a vaginal infection, do not have sex until you have finished your treatment. You can wear a sanitary napkin. Do not use tampons. Wear freshly washed cotton, not synthetic, panties. What side effects may   I notice from receiving  this medicine? Side effects that you should report to your doctor or health care professional as soon as possible:  allergic reactions like skin rash or itching, hives, swelling of the lips, mouth, tongue, or throat  dark urine  feeling dizzy or faint  irregular heartbeat or chest pain  redness, blistering, peeling or loosening of the skin, including inside the mouth  trouble breathing  unusual bruising or bleeding  vomiting  yellowing of the eyes or skin Side effects that usually do not require medical attention (report to your doctor or health care professional if they continue or are bothersome):  changes in how food tastes  diarrhea  headache  stomach upset or nausea This list may not describe all possible side effects. Call your doctor for medical advice about side effects. You may report side effects to FDA at 1-800-FDA-1088. Where should I keep my medicine? Keep out of the reach of children. Store at room temperature below 30 degrees C (86 degrees F). Throw away any medicine after the expiration date. NOTE: This sheet is a summary. It may not cover all possible information. If you have questions about this medicine, talk to your doctor, pharmacist, or health care provider.  2020 Elsevier/Gold Standard (2017-12-30 11:58:26)  

## 2018-10-24 NOTE — Progress Notes (Signed)
   Subjective:    Patient ID: Caitlin Duncan, female    DOB: 13-Jun-1990, 28 y.o.   MRN: [id number]  HPI Chief Complaint  Patient presents with  . yeast infection    yeast infection x2 week. used monistat and vagisil. discharge, irriatiation and itchying. will get flu shot at work   Here with complaints of a 2 week history of vaginal discharge and itching.  Denies fever, chills, abdominal pain, back pain, N/V/D or urinary symptoms.   Denies Hx of STD Would like tested for STD.   States she used vagisil and monistat and had some improvement temporarily but itching and discharge returned.   LMP: 10/02/2018 Contraception: condoms   Reports recent treatment for foot fungus with oral terbinafine.  Denies yeast infection since January 2020.   Reviewed allergies, medications, past medical, surgical, family, and social history.    Review of Systems Pertinent positives and negatives in the history of present illness.     Objective:   Physical Exam Exam conducted with a chaperone present.  Constitutional:      General: She is not in acute distress.    Appearance: She is not ill-appearing.  Cardiovascular:     Rate and Rhythm: Normal rate and regular rhythm.     Pulses: Normal pulses.     Heart sounds: Normal heart sounds.  Pulmonary:     Effort: Pulmonary effort is normal.     Breath sounds: Normal breath sounds.  Abdominal:     General: Abdomen is flat. There is no distension.     Palpations: Abdomen is soft.     Tenderness: There is no abdominal tenderness. There is no right CVA tenderness or left CVA tenderness.  Genitourinary:    General: Normal vulva.     Labia:        Right: No rash, tenderness or lesion.        Left: No rash, tenderness or lesion.      Vagina: Vaginal discharge present.     Comments: Thick, white discharge Skin:    General: Skin is warm and dry.  Neurological:     Mental Status: She is alert.    BP 120/80   Temp 98.2 F (36.8 C)   Wt 247  lb 6.4 oz (112.2 kg)   BMI 38.75 kg/m       Assessment & Plan:  Yeast vaginitis - Plan: fluconazole (DIFLUCAN) 150 MG tablet  Vaginal discharge - Plan: POCT Wet Prep ([place]et [place]), NuSwab Vaginitis Plus (VG+)  Vagina itching - Plan: POCT Wet Prep ([place]et [place]), NuSwab Vaginitis Plus (VG+)  Screen for STD (sexually transmitted disease) - Plan: RPR, NuSwab Vaginitis Plus (VG+), HIV Antibody (routine testing w rflx)  Wet prep +yeast, -BV, -trich GC/CT/ HIV, RPR ordered per patient request.  Treat with diflucan, denies pregnancy.  Follow up as needed.

## 2018-10-25 LAB — HIV ANTIBODY (ROUTINE TESTING W REFLEX): HIV Screen 4th Generation wRfx: NONREACTIVE

## 2018-10-25 LAB — RPR: RPR Ser Ql: NONREACTIVE

## 2018-10-28 ENCOUNTER — Other Ambulatory Visit: Payer: Self-pay | Admitting: Family Medicine

## 2018-10-28 DIAGNOSIS — A749 Chlamydial infection, unspecified: Secondary | ICD-10-CM

## 2018-10-28 DIAGNOSIS — B9689 Other specified bacterial agents as the cause of diseases classified elsewhere: Secondary | ICD-10-CM

## 2018-10-28 DIAGNOSIS — N760 Acute vaginitis: Secondary | ICD-10-CM

## 2018-10-28 DIAGNOSIS — A599 Trichomoniasis, unspecified: Secondary | ICD-10-CM

## 2018-10-28 MED ORDER — METRONIDAZOLE 500 MG PO TABS: 500.0000 mg | ORAL_TABLET | Freq: Two times a day (BID) | ORAL | 0 refills | Status: DC

## 2018-10-28 MED ORDER — AZITHROMYCIN 250 MG PO TABS: 1000.0000 mg | ORAL_TABLET | Freq: Once | ORAL | 0 refills | Status: AC

## 2018-10-29 LAB — NUSWAB VAGINITIS PLUS (VG+)
Atopobium vaginae: HIGH Score — AB
BVAB 2: HIGH Score — AB
Candida albicans, NAA: POSITIVE — AB
Candida glabrata, NAA: NEGATIVE
Chlamydia trachomatis, NAA: POSITIVE — AB
Megasphaera 1: HIGH Score — AB
Neisseria gonorrhoeae, NAA: NEGATIVE
Trich vag by NAA: POSITIVE — AB

## 2018-11-13 ENCOUNTER — Ambulatory Visit (INDEPENDENT_AMBULATORY_CARE_PROVIDER_SITE_OTHER): Payer: 59 | Admitting: Family Medicine

## 2018-11-13 ENCOUNTER — Encounter: Payer: Self-pay | Admitting: Family Medicine

## 2018-11-13 ENCOUNTER — Other Ambulatory Visit: Payer: Self-pay

## 2018-11-13 VITALS — BP 130/80 | Temp 96.6°F | Wt 244.4 lb

## 2018-11-13 DIAGNOSIS — A749 Chlamydial infection, unspecified: Secondary | ICD-10-CM

## 2018-11-13 DIAGNOSIS — A599 Trichomoniasis, unspecified: Secondary | ICD-10-CM

## 2018-11-13 DIAGNOSIS — B373 Candidiasis of vulva and vagina: Secondary | ICD-10-CM

## 2018-11-13 DIAGNOSIS — B9689 Other specified bacterial agents as the cause of diseases classified elsewhere: Secondary | ICD-10-CM

## 2018-11-13 DIAGNOSIS — N760 Acute vaginitis: Secondary | ICD-10-CM | POA: Diagnosis not present

## 2018-11-13 DIAGNOSIS — B3731 Acute candidiasis of vulva and vagina: Secondary | ICD-10-CM

## 2018-11-13 MED ORDER — TERCONAZOLE 0.8 % VA CREA: 1.0000 | TOPICAL_CREAM | Freq: Every day | VAGINAL | 0 refills | Status: AC

## 2018-11-13 NOTE — Progress Notes (Signed)
   Subjective:    Patient ID: Caitlin Duncan, female    DOB: Sep 14, 1990, 28 y.o.   MRN: [id number]  HPI Chief Complaint  Patient presents with  . follow-up    itching again, some blood yesterday. took antibiotic   Here to follow up on recent positive chlamydia, trichomonas infections. She also was positive for BV and yeast.  She was treated and reports symptoms resolved except she began having vaginal irritation again yesterday as well as external itching.  Denies any sexual activity since her last visit.  States her partner was tested yesterday and his results are not back yet.   Denies fever, chills, chest pain, palpitations, shortness of breath, abdominal pain, N/V/D, urinary symptoms.   Reviewed allergies, medications, past medical, surgical, family, and social history.   Review of Systems Pertinent positives and negatives in the history of present illness.     Objective:   Physical Exam Exam conducted with a chaperone present.  Constitutional:      Appearance: Normal appearance. She is not ill-appearing.  Genitourinary:    General: Normal vulva.     Vagina: Normal.  Neurological:     Mental Status: She is alert.    BP 130/80   Temp (!) 96.6 F (35.9 C)   Wt 244 lb 6.4 oz (110.9 kg)   BMI 38.28 kg/m       Assessment & Plan:  Chlamydia infection - Plan: NuSwab Vaginitis Plus (VG+)  Trichomonas infection - Plan: NuSwab Vaginitis Plus (VG+)  BV (bacterial vaginosis) - Plan: NuSwab Vaginitis Plus (VG+)  Yeast vaginitis - Plan: NuSwab Vaginitis Plus (VG+), terconazole (TERAZOL 3) 0.8 % vaginal cream  Will test to ensure treatment effective. Will prescribe topical antifungal for her to try due to lingering itching. Advised her to avoid sexual activity until results are back and to make sure her partner is also treated.  Follow up pending results.

## 2018-11-17 ENCOUNTER — Other Ambulatory Visit: Payer: Self-pay | Admitting: Internal Medicine

## 2018-11-17 MED ORDER — FLUCONAZOLE 150 MG PO TABS: 150.0000 mg | ORAL_TABLET | Freq: Once | ORAL | 1 refills | Status: AC

## 2018-11-20 LAB — NUSWAB VAGINITIS PLUS (VG+)
Candida albicans, NAA: POSITIVE — AB
Candida glabrata, NAA: NEGATIVE
Chlamydia trachomatis, NAA: POSITIVE — AB
Neisseria gonorrhoeae, NAA: NEGATIVE
Trich vag by NAA: NEGATIVE

## 2018-11-21 ENCOUNTER — Other Ambulatory Visit: Payer: Self-pay | Admitting: Internal Medicine

## 2018-11-21 DIAGNOSIS — A749 Chlamydial infection, unspecified: Secondary | ICD-10-CM

## 2018-11-21 MED ORDER — AZITHROMYCIN 250 MG PO TABS: ORAL_TABLET | ORAL | 0 refills | Status: DC

## 2018-12-03 ENCOUNTER — Ambulatory Visit (INDEPENDENT_AMBULATORY_CARE_PROVIDER_SITE_OTHER): Payer: 59 | Admitting: Family Medicine

## 2018-12-03 ENCOUNTER — Other Ambulatory Visit: Payer: Self-pay

## 2018-12-03 ENCOUNTER — Encounter: Payer: Self-pay | Admitting: Family Medicine

## 2018-12-03 VITALS — BP 120/70 | HR 64 | Temp 98.0°F | Wt 247.6 lb

## 2018-12-03 DIAGNOSIS — Z09 Encounter for follow-up examination after completed treatment for conditions other than malignant neoplasm: Secondary | ICD-10-CM | POA: Diagnosis not present

## 2018-12-03 DIAGNOSIS — A749 Chlamydial infection, unspecified: Secondary | ICD-10-CM | POA: Diagnosis not present

## 2018-12-03 NOTE — Progress Notes (Signed)
   Subjective:    Patient ID: Caitlin Duncan, female    DOB: 10-30-1990, 28 y.o.   MRN: [id number]  HPI Chief Complaint  Patient presents with  . yeast infection    follow-up on yeast infection. not having any symptoms.    She is here to follow up on positive chlamydia infection from 10/24/2018 which was not resolved with one dose of azithromycin. She had a positive treatment of cure test for chlamydia on 11/13/2018 and treated for a 2nd time with azithromycin. She was also positive for yeast and treated. States her partner did get treated. She has abstained from sexual activity.   Currently denies any vaginal symptoms.   Denies fever, chills, abdominal pain, back pain, N/V/D or urinary symptoms.   Review of Systems Pertinent positives and negatives in the history of present illness.     Objective:   Physical Exam Exam conducted with a chaperone present.  Constitutional:      Appearance: Normal appearance.  Genitourinary:    General: Normal vulva.     Vagina: Normal. No vaginal discharge.  Neurological:     Mental Status: She is alert.    BP 120/70   Pulse 64   Temp 98 F (36.7 C)   Wt 247 lb 9.6 oz (112.3 kg)   BMI 38.78 kg/m       Assessment & Plan:  Chlamydia infection - Plan: GC/Chlamydia Probe Amp([facility])  Follow-up exam  Asymptomatic. Swab obtained for GC/CT, chaperone present.  She has been treated x 2 for chlamydia infection with azithromycin, first treatment did not clear infection. Follow up pending result.

## 2018-12-05 LAB — GC/CHLAMYDIA PROBE AMP
Chlamydia trachomatis, NAA: NEGATIVE
Neisseria Gonorrhoeae by PCR: NEGATIVE

## 2018-12-25 ENCOUNTER — Other Ambulatory Visit: Payer: Self-pay

## 2018-12-25 ENCOUNTER — Ambulatory Visit: Payer: 59 | Admitting: Family Medicine

## 2019-04-20 ENCOUNTER — Telehealth: Payer: Self-pay | Admitting: Family Medicine

## 2019-04-20 NOTE — Telephone Encounter (Signed)
Dismissal letter in guarantor snapshot  °

## 2019-05-15 ENCOUNTER — Other Ambulatory Visit: Payer: Self-pay

## 2019-05-15 ENCOUNTER — Ambulatory Visit
Admission: EM | Admit: 2019-05-15 | Discharge: 2019-05-15 | Disposition: A | Discharge status: Home / Self Care | Payer: 59 | Attending: Emergency Medicine | Admitting: Emergency Medicine

## 2019-05-15 DIAGNOSIS — R519 Headache, unspecified: Secondary | ICD-10-CM

## 2019-05-15 DIAGNOSIS — Z20822 Contact with and (suspected) exposure to covid-19: Secondary | ICD-10-CM | POA: Diagnosis not present

## 2019-05-15 DIAGNOSIS — R05 Cough: Secondary | ICD-10-CM | POA: Diagnosis not present

## 2019-05-15 DIAGNOSIS — R059 Cough, unspecified: Secondary | ICD-10-CM

## 2019-05-15 MED ORDER — FLUTICASONE PROPIONATE 50 MCG/ACT NA SUSP: 1.0000 | Freq: Every day | NASAL | 0 refills | Status: DC

## 2019-05-15 MED ORDER — BENZONATATE 100 MG PO CAPS: 100.0000 mg | ORAL_CAPSULE | Freq: Three times a day (TID) | ORAL | 0 refills | Status: DC

## 2019-05-15 MED ORDER — CETIRIZINE HCL 10 MG PO TABS: 10.0000 mg | ORAL_TABLET | Freq: Every day | ORAL | 0 refills | Status: DC

## 2019-05-15 NOTE — Discharge Instructions (Signed)
Your COVID test is pending - it is important to quarantine / isolate at home until your results are back. °If you test positive and would like further evaluation for persistent or worsening symptoms, you may schedule an E-visit or virtual (video) visit throughout the [facility] My[facility] app or website. ° °PLEASE NOTE: If you develop severe chest pain or shortness of breath please go to the ER or call 9-1-1 for further evaluation --> DO NOT schedule electronic or virtual visits for this. °Please call our office for further guidance / recommendations as needed. ° °For information about the Covid vaccine, please visit [email or web address] °

## 2019-05-15 NOTE — ED Triage Notes (Signed)
Pt states had a positive covid exposure on Saturday. Pt c/o cough x2 days and woke up with a headache today.

## 2019-05-15 NOTE — ED Provider Notes (Signed)
[facility]    CSN: [id number] Arrival date & time: 05/15/19  1859      History   Chief Complaint Chief Complaint  Patient presents with  . Cough    HPI Caitlin Duncan is a 29 y.o. female presenting for Covid testing.  States that she was exposed to different people who tested positive: Last in contact on Saturday.  Patient has had a mildly productive cough without wheezing, difficulty breathing, or chest pain for the last 2 days.  States she woke up with a migraine today.  Patient does have history of migraines: States this feels similar.  Denies worsening of life, change in vision or hearing.  Took ibuprofen earlier this morning with some relief.  No nausea or vomiting, photophobia or phonophobia.  Denies fever, arthralgias, myalgias.    Past Medical History:  Diagnosis Date  . Anemia   . ASCUS with positive high risk HPV 03/2013  . Chlamydia 06/2013  . Dysmenorrhea    resolved  . Vitamin D deficiency 03/2013    Patient Active Problem List   Diagnosis Date Noted  . ASCUS with positive high risk HPV 04/30/2013  . Vitamin D deficiency 04/14/2013  . Obesity (BMI 30-39.9) 04/13/2013  . Unspecified contraceptive management 04/13/2013  . Anemia, unspecified 01/17/2011  . Heavy periods 01/17/2011  . Anemia 01/01/2011  . Dysmenorrhea 10/25/2010    History reviewed. No pertinent surgical history.  OB History    Gravida  1   Para  0   Term  0   Preterm  0   AB  1   Living  0     SAB  0   TAB  1   Ectopic  0   Multiple  0   Live Births               Home Medications    Prior to Admission medications   Medication Sig Start Date End Date Taking? Authorizing Provider  benzonatate (TESSALON) 100 MG capsule Take 1 capsule (100 mg total) by mouth every 8 (eight) hours. 05/15/19   Hall-Potvin, [place], PA-C  cetirizine (ZYRTEC ALLERGY) 10 MG tablet Take 1 tablet (10 mg total) by mouth daily. 05/15/19   Hall-Potvin, [place], PA-C    Cholecalciferol (VITAMIN D3) 5000 units CAPS Take 1 capsule by mouth daily.    [provider]  fluticasone (FLONASE) 50 MCG/ACT nasal spray Place 1 spray into both nostrils daily. 05/15/19   Hall-Potvin, [place], PA-C    Family History Family History  Problem Relation Age of Onset  . Hypertension Mother   . Kidney failure Mother        kidney transplant 2014 (related to pregnancy)  . Hypertension Sister   . Diabetes Neg Hx   . Cancer Neg Hx   . Heart disease Neg Hx     Social History Social History   Tobacco Use  . Smoking status: Never Smoker  . Smokeless tobacco: Never Used  Substance Use Topics  . Alcohol use: Not Currently    Alcohol/week: 1.0 standard drinks    Types: 1 Glasses of wine per week    Comment: 4-5 oz of wine/week  . Drug use: No     Allergies   Patient has no known allergies.   Review of Systems As per HPI   Physical Exam Triage Vital Signs ED Triage Vitals  Enc Vitals Group     BP      Pulse      Resp  Temp      Temp src      SpO2      Weight      Height      Head Circumference      Peak Flow      Pain Score      Pain Loc      Pain Edu?      Excl. in [place]?    No data found.  Updated Vital Signs BP 130/89 (BP Location: Left Arm)   Pulse 92   Temp 98.6 F (37 C) (Oral)   Resp 16   LMP 05/08/2019   SpO2 99%   Visual Acuity Right Eye Distance:   Left Eye Distance:   Bilateral Distance:    Right Eye Near:   Left Eye Near:    Bilateral Near:     Physical Exam Constitutional:      General: She is not in acute distress.    Appearance: She is obese. She is not ill-appearing.  HENT:     Head: Normocephalic and atraumatic.     Jaw: There is normal jaw occlusion. No tenderness or pain on movement.     Right Ear: Hearing, tympanic membrane, ear canal and external ear normal. No tenderness. No mastoid tenderness.     Left Ear: Hearing, tympanic membrane, ear canal and external ear normal. No tenderness. No  mastoid tenderness.     Nose: No nasal deformity, septal deviation or nasal tenderness.     Right Turbinates: Not swollen or pale.     Left Turbinates: Not swollen or pale.     Right Sinus: No maxillary sinus tenderness or frontal sinus tenderness.     Left Sinus: No maxillary sinus tenderness or frontal sinus tenderness.     Mouth/Throat:     Lips: Pink. No lesions.     Mouth: Mucous membranes are moist. No injury.     Pharynx: Oropharynx is clear. Uvula midline. No posterior oropharyngeal erythema or uvula swelling.     Comments: no tonsillar exudate or hypertrophy Eyes:     General: No scleral icterus.    Pupils: Pupils are equal, round, and reactive to light.  Cardiovascular:     Rate and Rhythm: Normal rate and regular rhythm.  Pulmonary:     Effort: Pulmonary effort is normal. No respiratory distress.     Breath sounds: No wheezing.  Musculoskeletal:     Cervical back: Normal range of motion and neck supple. No tenderness. No muscular tenderness.  Lymphadenopathy:     Cervical: No cervical adenopathy.  Skin:    Coloration: Skin is not jaundiced or pale.  Neurological:     Mental Status: She is alert and oriented to person, place, and time.      UC Treatments / Results  Labs (all labs ordered are listed, but only abnormal results are displayed) Labs Reviewed  NOVEL CORONAVIRUS, NAA    EKG   Radiology No results found.  Procedures Procedures (including critical care time)  Medications Ordered in UC Medications - No data to display  Initial Impression / Assessment and Plan / UC Course  I have reviewed the triage vital signs and the nursing notes.  Pertinent labs & imaging results that were available during my care of the patient were reviewed by me and considered in my medical decision making (see chart for details).     Patient afebrile, nontoxic, with SpO2 99%.  Covid PCR pending.  Patient to quarantine until results are back.  We will  treat supportively  as outlined below.  Return precautions discussed, patient verbalized understanding and is agreeable to plan. Final Clinical Impressions(s) / UC Diagnoses   Final diagnoses:  Exposure to COVID-19 virus  Cough  Nonintractable headache, unspecified chronicity pattern, unspecified headache type     Discharge Instructions     Your COVID test is pending - it is important to quarantine / isolate at home until your results are back. If you test positive and would like further evaluation for persistent or worsening symptoms, you may schedule an E-visit or virtual (video) visit throughout the [facility] app or website.  PLEASE NOTE: If you develop severe chest pain or shortness of breath please go to the ER or call 9-1-1 for further evaluation --> DO NOT schedule electronic or virtual visits for this. Please call our office for further guidance / recommendations as needed.  For information about the Covid vaccine, please visit [email or web address]    ED Prescriptions    Medication Sig Dispense Auth. Provider   benzonatate (TESSALON) 100 MG capsule Take 1 capsule (100 mg total) by mouth every 8 (eight) hours. 21 capsule Hall-Potvin, [place], PA-C   cetirizine (ZYRTEC ALLERGY) 10 MG tablet Take 1 tablet (10 mg total) by mouth daily. 30 tablet Hall-Potvin, [place], PA-C   fluticasone (FLONASE) 50 MCG/ACT nasal spray Place 1 spray into both nostrils daily. 16 g Hall-Potvin, [place], PA-C     PDMP not reviewed this encounter.   Odette Fraction [place], [place] 05/15/19 1929

## 2019-05-17 LAB — NOVEL CORONAVIRUS, NAA: SARS-CoV-2, NAA: NOT DETECTED

## 2019-05-17 LAB — SARS-COV-2, NAA 2 DAY TAT

## 2019-06-14 ENCOUNTER — Ambulatory Visit
Admission: EM | Admit: 2019-06-14 | Discharge: 2019-06-14 | Disposition: A | Discharge status: Home / Self Care | Payer: 59 | Attending: Emergency Medicine | Admitting: Emergency Medicine

## 2019-06-14 ENCOUNTER — Other Ambulatory Visit: Payer: Self-pay

## 2019-06-14 ENCOUNTER — Ambulatory Visit (INDEPENDENT_AMBULATORY_CARE_PROVIDER_SITE_OTHER): Payer: 59

## 2019-06-14 ENCOUNTER — Encounter: Payer: Self-pay | Admitting: Emergency Medicine

## 2019-06-14 DIAGNOSIS — R509 Fever, unspecified: Secondary | ICD-10-CM

## 2019-06-14 DIAGNOSIS — R197 Diarrhea, unspecified: Secondary | ICD-10-CM | POA: Diagnosis not present

## 2019-06-14 DIAGNOSIS — R05 Cough: Secondary | ICD-10-CM | POA: Diagnosis not present

## 2019-06-14 DIAGNOSIS — R112 Nausea with vomiting, unspecified: Secondary | ICD-10-CM

## 2019-06-14 DIAGNOSIS — R059 Cough, unspecified: Secondary | ICD-10-CM

## 2019-06-14 MED ORDER — ONDANSETRON 4 MG PO TBDP
4.0000 mg | ORAL_TABLET | Freq: Once | ORAL | Status: AC
  Administered 2019-06-14: 4 mg via ORAL

## 2019-06-14 MED ORDER — KETOROLAC TROMETHAMINE 10 MG PO TABS: 10.0000 mg | ORAL_TABLET | Freq: Four times a day (QID) | ORAL | 0 refills | Status: AC | PRN

## 2019-06-14 MED ORDER — KETOROLAC TROMETHAMINE 30 MG/ML IJ SOLN
30.0000 mg | Freq: Once | INTRAMUSCULAR | Status: AC
  Administered 2019-06-14: 30 mg via INTRAMUSCULAR

## 2019-06-14 MED ORDER — BENZONATATE 100 MG PO CAPS
100.0000 mg | ORAL_CAPSULE | Freq: Three times a day (TID) | ORAL | 0 refills | Status: DC
Start: 2019-06-14 — End: 2019-08-12

## 2019-06-14 MED ORDER — ACETAMINOPHEN 325 MG PO TABS
650.0000 mg | ORAL_TABLET | Freq: Once | ORAL | Status: AC
  Administered 2019-06-14: 650 mg via ORAL

## 2019-06-14 MED ORDER — ONDANSETRON 4 MG PO TBDP
4.0000 mg | ORAL_TABLET | Freq: Three times a day (TID) | ORAL | 0 refills | Status: DC | PRN
Start: 2019-06-14 — End: 2019-08-12

## 2019-06-14 NOTE — ED Provider Notes (Signed)
[facility]    CSN: [id number] Arrival date & time: 06/14/19  1249      History   Chief Complaint Chief Complaint  Patient presents with  . Abdominal Pain    HPI Caitlin Duncan is a 29 y.o. female presenting for 3-day course of GI symptoms.  States it started Friday with diarrhea without blood or melena.  Yesterday was able to eat eggs, bacon, bread, though woke up from her nap and began vomiting.  Patient has vomited 7-8 times today, 5 episodes of diarrhea.  Patient does endorse low back pain: No pelvic pain, vaginal discharge.  LMP 05/24/2019: No coitus since.  Denies fever, arthralgias, myalgias.  Does endorse 2-week course of cough without difficulty breathing or chest pain.  States she already underwent Covid testing last week: Negative.    Past Medical History:  Diagnosis Date  . Anemia   . ASCUS with positive high risk HPV 03/2013  . Chlamydia 06/2013  . Dysmenorrhea    resolved  . Vitamin D deficiency 03/2013    Patient Active Problem List   Diagnosis Date Noted  . ASCUS with positive high risk HPV 04/30/2013  . Vitamin D deficiency 04/14/2013  . Obesity (BMI 30-39.9) 04/13/2013  . Unspecified contraceptive management 04/13/2013  . Anemia, unspecified 01/17/2011  . Heavy periods 01/17/2011  . Anemia 01/01/2011  . Dysmenorrhea 10/25/2010    History reviewed. No pertinent surgical history.  OB History    Gravida  1   Para  0   Term  0   Preterm  0   AB  1   Living  0     SAB  0   TAB  1   Ectopic  0   Multiple  0   Live Births               Home Medications    Prior to Admission medications   Medication Sig Start Date End Date Taking? Authorizing Provider  benzonatate (TESSALON) 100 MG capsule Take 1 capsule (100 mg total) by mouth every 8 (eight) hours. 06/14/19   Hall-Potvin, [place], PA-C  Cholecalciferol (VITAMIN D3) 5000 units CAPS Take 1 capsule by mouth daily.    [provider]  ketorolac (TORADOL) 10  MG tablet Take 1 tablet (10 mg total) by mouth every 6 (six) hours as needed for up to 3 days. 06/14/19 06/17/19  Hall-Potvin, [place], PA-C  ondansetron (ZOFRAN ODT) 4 MG disintegrating tablet Take 1 tablet (4 mg total) by mouth every 8 (eight) hours as needed for nausea or vomiting. 06/14/19   Hall-Potvin, [place], PA-C  cetirizine (ZYRTEC ALLERGY) 10 MG tablet Take 1 tablet (10 mg total) by mouth daily. 05/15/19 06/14/19  Hall-Potvin, [place], PA-C  fluticasone (FLONASE) 50 MCG/ACT nasal spray Place 1 spray into both nostrils daily. 05/15/19 06/14/19  Hall-Potvin, [place], PA-C    Family History Family History  Problem Relation Age of Onset  . Hypertension Mother   . Kidney failure Mother        kidney transplant 2014 (related to pregnancy)  . Hypertension Sister   . Diabetes Neg Hx   . Cancer Neg Hx   . Heart disease Neg Hx     Social History Social History   Tobacco Use  . Smoking status: Never Smoker  . Smokeless tobacco: Never Used  Substance Use Topics  . Alcohol use: Not Currently    Alcohol/week: 1.0 standard drinks    Types: 1 Glasses of wine per week  Comment: 4-5 oz of wine/week  . Drug use: No     Allergies   Patient has no known allergies.   Review of Systems As per HPI   Physical Exam Triage Vital Signs ED Triage Vitals  Enc Vitals Group     BP      Pulse      Resp      Temp      Temp src      SpO2      Weight      Height      Head Circumference      Peak Flow      Pain Score      Pain Loc      Pain Edu?      Excl. in GC?    No data found.  Updated Vital Signs BP 117/84 (BP Location: Left Arm) Comment (BP Location): large cuff  Pulse 96   Temp (!) 100.9 F (38.3 C) (Oral)   Resp 20   LMP 05/24/2019   SpO2 98%   Visual Acuity Right Eye Distance:   Left Eye Distance:   Bilateral Distance:    Right Eye Near:   Left Eye Near:    Bilateral Near:     Physical Exam Constitutional:      General: She is not in acute distress.     Appearance: She is obese. She is not toxic-appearing or diaphoretic.  HENT:     Head: Normocephalic and atraumatic.  Eyes:     General: No scleral icterus.    Pupils: Pupils are equal, round, and reactive to light.  Cardiovascular:     Rate and Rhythm: Normal rate.  Pulmonary:     Effort: Pulmonary effort is normal. No respiratory distress.     Breath sounds: No wheezing.  Abdominal:     General: Bowel sounds are normal. There is no distension or abdominal bruit.     Palpations: Abdomen is soft. There is no hepatomegaly, splenomegaly or pulsatile mass.     Tenderness: There is generalized abdominal tenderness. There is no right CVA tenderness, left CVA tenderness, guarding or rebound. Negative signs include Murphy's sign, Rovsing's sign and McBurney's sign.     Hernia: No hernia is present.  Musculoskeletal:     Comments: Diffuse lumbar tenderness.  No spinous process tenderness or deformity, PSIS tenderness, crepitus  Skin:    Capillary Refill: Capillary refill takes less than 2 seconds.     Coloration: Skin is not cyanotic, jaundiced, mottled or pale.  Neurological:     Mental Status: She is alert and oriented to person, place, and time.      UC Treatments / Results  Labs (all labs ordered are listed, but only abnormal results are displayed) Labs Reviewed - No data to display  EKG   Radiology DG Chest 2 View  Result Date: 06/14/2019 CLINICAL DATA:  Cough for 2 weeks. EXAM: CHEST - 2 VIEW COMPARISON:  None. FINDINGS: The heart, hila, and mediastinum are normal. No pneumothorax. No nodules or masses. No focal infiltrates. No acute abnormalities. IMPRESSION: No active cardiopulmonary disease. Electronically Signed   By: Gerome Sam III M.D   On: 06/14/2019 13:57    Procedures Procedures (including critical care time)  Medications Ordered in UC Medications  ondansetron (ZOFRAN-ODT) disintegrating tablet 4 mg (4 mg Oral Given 06/14/19 1309)  acetaminophen (TYLENOL)  tablet 650 mg (650 mg Oral Given 06/14/19 1326)  ketorolac (TORADOL) 30 MG/ML injection 30 mg (30 mg Intramuscular Given  06/14/19 1330)    Initial Impression / Assessment and Plan / UC Course  I have reviewed the triage vital signs and the nursing notes.  Pertinent labs & imaging results that were available during my care of the patient were reviewed by me and considered in my medical decision making (see chart for details).     Patient febrile, nontoxic in office today.  Patient given p.o. Zofran, Tylenol which she tolerated well for nausea and fever.  Given Toradol shot for low back pain.  Declined rapid Covid today.  X-ray done office, reviewed by me and radiology: Negative for active cardiopulmonary disease.  Reviewed findings with patient verbalized understanding.  Likely viral versus adverse food reaction.  Provided information on supportive dietary care, and sent in prescriptions for supportive care.  Return precautions discussed, patient verbalized understanding and is agreeable to plan. Final Clinical Impressions(s) / UC Diagnoses   Final diagnoses:  Fever, unspecified fever cause  Cough  Non-intractable vomiting with nausea, unspecified vomiting type  Diarrhea, unspecified type     Discharge Instructions     Follow bland diet for food recommendations while you are having GI bug. Please call our office if you have any questions about the medications we discussed. Return for worsening abdominal pain, blood in your vomit or stool, uncontrolled fevers.    ED Prescriptions    Medication Sig Dispense Auth. Provider   ondansetron (ZOFRAN ODT) 4 MG disintegrating tablet Take 1 tablet (4 mg total) by mouth every 8 (eight) hours as needed for nausea or vomiting. 21 tablet Hall-Potvin, [place], PA-C   ketorolac (TORADOL) 10 MG tablet Take 1 tablet (10 mg total) by mouth every 6 (six) hours as needed for up to 3 days. 12 tablet Hall-Potvin, [place], PA-C   benzonatate (TESSALON) 100  MG capsule Take 1 capsule (100 mg total) by mouth every 8 (eight) hours. 21 capsule Hall-Potvin, [place], PA-C     PDMP not reviewed this encounter.   Hall-Potvin, [place], [place] 06/14/19 1408

## 2019-06-14 NOTE — Discharge Instructions (Addendum)
Follow bland diet for food recommendations while you are having GI bug. Please call our office if you have any questions about the medications we discussed. Return for worsening abdominal pain, blood in your vomit or stool, uncontrolled fevers.

## 2019-06-14 NOTE — ED Triage Notes (Signed)
Diarrhea initially (on Friday), then diarrhea and vomiting.  Yesterday ate eggs, bacon, bread, took nap and woke vomiting.   Vomited x 7-8 times today. Diarrhea x 5 episodes today.

## 2019-08-12 ENCOUNTER — Ambulatory Visit
Admission: EM | Admit: 2019-08-12 | Discharge: 2019-08-12 | Disposition: A | Discharge status: Home / Self Care | Payer: 59 | Attending: Family Medicine | Admitting: Family Medicine

## 2019-08-12 DIAGNOSIS — R509 Fever, unspecified: Secondary | ICD-10-CM

## 2019-08-12 DIAGNOSIS — Z1152 Encounter for screening for COVID-19: Secondary | ICD-10-CM | POA: Diagnosis not present

## 2019-08-12 DIAGNOSIS — R05 Cough: Secondary | ICD-10-CM

## 2019-08-12 DIAGNOSIS — R059 Cough, unspecified: Secondary | ICD-10-CM

## 2019-08-12 MED ORDER — PREDNISONE 10 MG (21) PO TBPK: ORAL_TABLET | Freq: Every day | ORAL | 0 refills | Status: DC

## 2019-08-12 MED ORDER — HYDROCODONE-HOMATROPINE 5-1.5 MG/5ML PO SYRP: 5.0000 mL | ORAL_SOLUTION | Freq: Four times a day (QID) | ORAL | 0 refills | Status: DC | PRN

## 2019-08-12 MED ORDER — DEXAMETHASONE SODIUM PHOSPHATE 10 MG/ML IJ SOLN
10.0000 mg | Freq: Once | INTRAMUSCULAR | Status: AC
  Administered 2019-08-12: 10 mg via INTRAMUSCULAR

## 2019-08-12 MED ORDER — ALBUTEROL SULFATE HFA 108 (90 BASE) MCG/ACT IN AERS: 2.0000 | INHALATION_SPRAY | RESPIRATORY_TRACT | 0 refills | Status: DC | PRN

## 2019-08-12 NOTE — ED Provider Notes (Signed)
[facility]   [id number] 08/12/19 Arrival Time: 1728   CC: COVID symptoms  SUBJECTIVE: History from: patient.  Caitlin Duncan is a 29 y.o. female who presents with abrupt onset of nasal congestion, PND, and persistent dry cough for 6 days.  Reports that her sister and tested positive for Covid.  Denies having Covid in the past.  Denies having Covid vaccines. Has not taken OTC medications for this. There are no aggravating or alleviating factors. Denies previous symptoms in the past. Denies chills, fatigue, sinus pain, rhinorrhea, sore throat, SOB, wheezing, chest pain, nausea, changes in bowel or bladder habits.    ROS: As per HPI.  All other pertinent ROS negative.     Past Medical History:  Diagnosis Date  . Anemia   . ASCUS with positive high risk HPV 03/2013  . Chlamydia 06/2013  . Dysmenorrhea    resolved  . Vitamin D deficiency 03/2013   History reviewed. No pertinent surgical history. No Known Allergies No current facility-administered medications on file prior to encounter.   Current Outpatient Medications on File Prior to Encounter  Medication Sig Dispense Refill  . Cholecalciferol (VITAMIN D3) 5000 units CAPS Take 1 capsule by mouth daily.    . [DISCONTINUED] cetirizine (ZYRTEC ALLERGY) 10 MG tablet Take 1 tablet (10 mg total) by mouth daily. 30 tablet 0  . [DISCONTINUED] fluticasone (FLONASE) 50 MCG/ACT nasal spray Place 1 spray into both nostrils daily. 16 g 0   Social History   Socioeconomic History  . Marital status: Single    Spouse name: Not on file  . Number of children: Not on file  . Years of education: Not on file  . Highest education level: Not on file  Occupational History  . Not on file  Tobacco Use  . Smoking status: Never Smoker  . Smokeless tobacco: Never Used  Vaping Use  . Vaping Use: Never used  Substance and Sexual Activity  . Alcohol use: Not Currently    Alcohol/week: 1.0 standard drink    Types: 1 Glasses of wine per  week    Comment: 4-5 oz of wine/week  . Drug use: No  . Sexual activity: Yes    Partners: Male    Birth control/protection: Condom  Other Topics Concern  . Not on file  Social History Narrative   Lives alone. No pets.   Family lives in [place].   Graduated from [employer] (criminal justice) . Lives alone.  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 May move to [place]. Applied for jobs in [place] (law enforcement)--got delayed due to death in the family, but still planning to do this.   Updated 04/2017   Social Determinants of Health   Financial Resource Strain:   . Difficulty of Paying Living Expenses:   Food Insecurity:   . Worried About Programme researcher, broadcasting/film/video in the Last Year:   . Barista in the Last Year:   Transportation Needs:   . Freight forwarder (Medical):   Marland Kitchen Lack of Transportation (Non-Medical):   Physical Activity:   . Days of Exercise per Week:   . Minutes of Exercise per Session:   Stress:   . Feeling of Stress :   Social Connections:   . Frequency of Communication with Friends and Family:   . Frequency of Social Gatherings with Friends and Family:   . Attends Religious Services:   . Active Member of Clubs or Organizations:   .  Attends Banker Meetings:   Marland Kitchen Marital Status:   Intimate Partner Violence:   . Fear of Current or Ex-Partner:   . Emotionally Abused:   Marland Kitchen Physically Abused:   . Sexually Abused:    Family History  Problem Relation Age of Onset  . Hypertension Mother   . Kidney failure Mother        kidney transplant 2014 (related to pregnancy)  . Hypertension Sister   . Diabetes Neg Hx   . Cancer Neg Hx   . Heart disease Neg Hx     OBJECTIVE:  Vitals:   08/12/19 1758 08/12/19 1805  BP: 112/80   Pulse: 97   Resp:  18  Temp: 98.6 F (37 C)   TempSrc: Oral   SpO2: 97%      General appearance: alert; appears fatigued, but nontoxic; speaking in full sentences  and tolerating own secretions HEENT: NCAT; Ears: EACs clear, TMs pearly gray; Eyes: PERRL.  EOM grossly intact. Sinuses: nontender; Nose: nares patent without rhinorrhea, Throat: oropharynx clear, tonsils  erythematous or enlarged, uvula midline  Neck: supple without LAD Lungs: unlabored respirations, symmetrical air entry; cough: moderate; no respiratory distress; diffuse wheezing to bilateral lung fields Heart: regular rate and rhythm.  Radial pulses 2+ symmetrical bilaterally Skin: warm and dry Psychological: alert and cooperative; normal mood and affect  LABS:  No results found for this or any previous visit (from the past 24 hour(s)).   ASSESSMENT & PLAN:  1. Cough   2. Encounter for screening for COVID-19   3. Fever, unspecified fever cause     Meds ordered this encounter  Medications  . dexamethasone (DECADRON) injection 10 mg  . predniSONE (STERAPRED UNI-PAK 21 TAB) 10 MG (21) TBPK tablet    Sig: Take by mouth daily. Take 6 tabs by mouth daily  for 2 days, then 5 tabs for 2 days, then 4 tabs for 2 days, then 3 tabs for 2 days, 2 tabs for 2 days, then 1 tab by mouth daily for 2 days    Dispense:  42 tablet    Refill:  0    Order Specific Question:   Supervising Provider    Answer:   Merrilee Jansky [id number]  . HYDROcodone-homatropine (HYCODAN) 5-1.5 MG/5ML syrup    Sig: Take 5 mLs by mouth every 6 (six) hours as needed for cough.    Dispense:  120 mL    Refill:  0    Order Specific Question:   Supervising Provider    Answer:   Merrilee Jansky [id number]  . albuterol (VENTOLIN HFA) 108 (90 Base) MCG/ACT inhaler    Sig: Inhale 2 puffs into the lungs every 4 (four) hours as needed for wheezing or shortness of breath.    Dispense:  18 g    Refill:  0    Order Specific Question:   Supervising Provider    Answer:   Merrilee Jansky [id number]    Decadron 10 mg IM given in office today Prescribed prednisone taper, discussed with patient how to take this Prescribed  Hycodan cough syrup Cough syrup sedation precautions given COVID testing ordered.  It will take between 1-2 days for test results.  Someone will contact you regarding abnormal results.    Patient should remain in quarantine until they have received Covid results.  If negative you may resume normal activities (go back to work/school) while practicing hand hygiene, social distance, and mask wearing.  If positive, patient should remain in quarantine  for 10 days from symptom onset AND greater than 72 hours after symptoms resolution (absence of fever without the use of fever-reducing medication and improvement in respiratory symptoms), whichever is longer Get plenty of rest and push fluids Use OTC zyrtec for nasal congestion, runny nose, and/or sore throat Use OTC flonase for nasal congestion and runny nose Use medications daily for symptom relief Use OTC medications like ibuprofen or tylenol as needed fever or pain Call or go to the ED if you have any new or worsening symptoms such as fever, worsening cough, shortness of breath, chest tightness, chest pain, turning blue, changes in mental status.  Reviewed expectations re: course of current medical issues. Questions answered. Outlined signs and symptoms indicating need for more acute intervention. Patient verbalized understanding. After Visit Summary given.         Moshe Cipro, NP 08/13/19 [phone]

## 2019-08-12 NOTE — Discharge Instructions (Addendum)
Your COVID test is pending.  You should self quarantine until the test result is back.    Take Tylenol as needed for fever or discomfort.  Rest and keep yourself hydrated.    Go to the emergency department if you develop shortness of breath, severe diarrhea, high fever not relieved by Tylenol or ibuprofen, or other concerning symptoms.    

## 2019-08-12 NOTE — ED Triage Notes (Signed)
Pt c/o cough, runny nose, and chills x6 days. States her sister of the home was dx with covid yesterday.

## 2019-08-14 LAB — NOVEL CORONAVIRUS, NAA: SARS-CoV-2, NAA: DETECTED — AB

## 2019-08-14 LAB — SARS-COV-2, NAA 2 DAY TAT

## 2019-08-15 ENCOUNTER — Telehealth: Payer: Self-pay | Admitting: Adult Health

## 2019-08-15 NOTE — Telephone Encounter (Signed)
Called patient as she is COVID positive, to discuss monoclonal antibody therapy, a treatment given to patients who are at increased risk for complications and or hospitalization of the virus.  Caitlin Duncan is at increased risk based on her BMI of greater than 25.    I could not reach patient and unfortunately was unable to leave a message on her voice mail as it was full.    My chart message sent.  Our call back number is [phone].  Lillard Anes, NP

## 2020-03-17 ENCOUNTER — Other Ambulatory Visit: Payer: Self-pay

## 2020-03-17 ENCOUNTER — Ambulatory Visit
Admission: EM | Admit: 2020-03-17 | Discharge: 2020-03-17 | Disposition: A | Discharge status: Home / Self Care | Payer: No Typology Code available for payment source | Attending: Emergency Medicine | Admitting: Emergency Medicine

## 2020-03-17 DIAGNOSIS — N760 Acute vaginitis: Secondary | ICD-10-CM

## 2020-03-17 MED ORDER — NAPROXEN 500 MG PO TABS
500.0000 mg | ORAL_TABLET | Freq: Two times a day (BID) | ORAL | 0 refills | Status: DC
Start: 2020-03-17 — End: 2020-11-25

## 2020-03-17 MED ORDER — FLUCONAZOLE 150 MG PO TABS
150.0000 mg | ORAL_TABLET | Freq: Once | ORAL | 0 refills | Status: AC
Start: 2020-03-17 — End: 2020-03-17

## 2020-03-17 NOTE — ED Provider Notes (Signed)
[facility]    CSN: [id number] Arrival date & time: 03/17/20  1716      History   Chief Complaint No chief complaint on file. Vaginitis  HPI Caitlin Duncan is a 30 y.o. female presenting today for evaluation of possible yeast infection.  Patient reports that over the past couple weeks she has had vaginal irritation discharge and itching.  Reports that she was treated for BV approximately 1 month ago and completed course of Flagyl.  Was on menstrual cycle around 03/03/2020 and has continued to feel irritation since.  She denies any urinary symptoms of dysuria increased frequency urgency.  Denies being sexually active currently and denies concern for pregnancy.  Reports some occasional abdominal cramping feeling like typical menstrual cramping.  HPI  Past Medical History:  Diagnosis Date  . Anemia   . ASCUS with positive high risk HPV 03/2013  . Chlamydia 06/2013  . Dysmenorrhea    resolved  . Vitamin D deficiency 03/2013    Patient Active Problem List   Diagnosis Date Noted  . ASCUS with positive high risk HPV 04/30/2013  . Vitamin D deficiency 04/14/2013  . Obesity (BMI 30-39.9) 04/13/2013  . Unspecified contraceptive management 04/13/2013  . Anemia, unspecified 01/17/2011  . Heavy periods 01/17/2011  . Anemia 01/01/2011  . Dysmenorrhea 10/25/2010    History reviewed. No pertinent surgical history.  OB History    Gravida  1   Para  0   Term  0   Preterm  0   AB  1   Living  0     SAB  0   IAB  1   Ectopic  0   Multiple  0   Live Births               Home Medications    Prior to Admission medications   Medication Sig Start Date End Date Taking? Authorizing Provider  fluconazole (DIFLUCAN) 150 MG tablet Take 1 tablet (150 mg total) by mouth once for 1 dose. 03/17/20 03/17/20 Yes Verne Cove C, PA-C  naproxen (NAPROSYN) 500 MG tablet Take 1 tablet (500 mg total) by mouth 2 (two) times daily. 03/17/20  Yes Masie Bermingham C, PA-C   albuterol (VENTOLIN HFA) 108 (90 Base) MCG/ACT inhaler Inhale 2 puffs into the lungs every 4 (four) hours as needed for wheezing or shortness of breath. 08/12/19   Moshe Cipro, NP  cetirizine (ZYRTEC ALLERGY) 10 MG tablet Take 1 tablet (10 mg total) by mouth daily. 05/15/19 06/14/19  Hall-Potvin, [place], PA-C  fluticasone (FLONASE) 50 MCG/ACT nasal spray Place 1 spray into both nostrils daily. 05/15/19 06/14/19  Hall-Potvin, [place], PA-C    Family History Family History  Problem Relation Age of Onset  . Hypertension Mother   . Kidney failure Mother        kidney transplant 2014 (related to pregnancy)  . Hypertension Sister   . Diabetes Neg Hx   . Cancer Neg Hx   . Heart disease Neg Hx     Social History Social History   Tobacco Use  . Smoking status: Never Smoker  . Smokeless tobacco: Never Used  Vaping Use  . Vaping Use: Never used  Substance Use Topics  . Alcohol use: Not Currently    Alcohol/week: 1.0 standard drink    Types: 1 Glasses of wine per week    Comment: 4-5 oz of wine/week  . Drug use: No     Allergies   Patient has no known allergies.  Review of Systems Review of Systems  Constitutional: Negative for fever.  Respiratory: Negative for shortness of breath.   Cardiovascular: Negative for chest pain.  Gastrointestinal: Negative for abdominal pain, diarrhea, nausea and vomiting.  Genitourinary: Positive for vaginal discharge. Negative for dysuria, flank pain, genital sores, hematuria, menstrual problem, vaginal bleeding and vaginal pain.  Musculoskeletal: Negative for back pain.  Skin: Negative for rash.  Neurological: Negative for dizziness, light-headedness and headaches.     Physical Exam Triage Vital Signs ED Triage Vitals  Enc Vitals Group     BP 03/17/20 1821 (!) 129/91     Pulse Rate 03/17/20 1821 81     Resp 03/17/20 1821 16     Temp 03/17/20 1821 98.4 F (36.9 C)     Temp src --      SpO2 03/17/20 1821 99 %     Weight --       Height --      Head Circumference --      Peak Flow --      Pain Score 03/17/20 1819 0     Pain Loc --      Pain Edu? --      Excl. in GC? --    No data found.  Updated Vital Signs BP (!) 129/91 (BP Location: Right Arm)   Pulse 81   Temp 98.4 F (36.9 C)   Resp 16   LMP 03/03/2020   SpO2 99%   Visual Acuity Right Eye Distance:   Left Eye Distance:   Bilateral Distance:    Right Eye Near:   Left Eye Near:    Bilateral Near:     Physical Exam Vitals and nursing note reviewed.  Constitutional:      Appearance: She is well-developed and well-nourished.     Comments: No acute distress  HENT:     Head: Normocephalic and atraumatic.     Nose: Nose normal.  Eyes:     Conjunctiva/sclera: Conjunctivae normal.  Cardiovascular:     Rate and Rhythm: Normal rate.  Pulmonary:     Effort: Pulmonary effort is normal. No respiratory distress.  Abdominal:     General: There is no distension.     Comments: Soft, nondistended, nontender to light and deep palpation to abdomen  Musculoskeletal:        General: Normal range of motion.     Cervical back: Neck supple.  Skin:    General: Skin is warm and dry.  Neurological:     Mental Status: She is alert and oriented to person, place, and time.  Psychiatric:        Mood and Affect: Mood and affect normal.      UC Treatments / Results  Labs (all labs ordered are listed, but only abnormal results are displayed) Labs Reviewed  CERVICOVAGINAL ANCILLARY ONLY    EKG   Radiology No results found.  Procedures Procedures (including critical care time)  Medications Ordered in UC Medications - No data to display  Initial Impression / Assessment and Plan / UC Course  I have reviewed the triage vital signs and the nursing notes.  Pertinent labs & imaging results that were available during my care of the patient were reviewed by me and considered in my medical decision making (see chart for details).     Vaginal swab  pending, will call with results and alter therapy as needed.  Empirically treating for Diflucan today as well as providing Naprosyn to use as needed for abdominal cramping.  Monitor for gradual resolution of symptoms.Discussed strict return precautions. Patient verbalized understanding and is agreeable with plan.  Final Clinical Impressions(s) / UC Diagnoses   Final diagnoses:  Vaginitis and vulvovaginitis     Discharge Instructions     Take 1 tab of Diflucan today, may repeat in 72 hours if still having symptoms and swab positive for yeast Naprosyn twice daily as needed for cramping We will call with results of swab if abnormal and alter therapy as needed    ED Prescriptions    Medication Sig Dispense Auth. Provider   fluconazole (DIFLUCAN) 150 MG tablet Take 1 tablet (150 mg total) by mouth once for 1 dose. 2 tablet Reise Hietala C, PA-C   naproxen (NAPROSYN) 500 MG tablet Take 1 tablet (500 mg total) by mouth 2 (two) times daily. 30 tablet Maitri Schnoebelen, Kosse C, PA-C     PDMP not reviewed this encounter.   Lew Dawes, [place] 03/17/20 1952

## 2020-03-17 NOTE — Discharge Instructions (Addendum)
Take 1 tab of Diflucan today, may repeat in 72 hours if still having symptoms and swab positive for yeast Naprosyn twice daily as needed for cramping We will call with results of swab if abnormal and alter therapy as needed

## 2020-03-17 NOTE — ED Triage Notes (Signed)
Patient presents to Urgent Care with complaints of possible UTI, has some vaginal irritation, abdominal cramping, vaginal discharge, dysuria since last Saturday. Pt reports some blood noted in stool last Tuesday. Treating with 7 day monistat.   Denies fever or hematuria.

## 2020-03-18 ENCOUNTER — Telehealth (HOSPITAL_COMMUNITY): Payer: Self-pay | Admitting: Emergency Medicine

## 2020-03-18 LAB — CERVICOVAGINAL ANCILLARY ONLY
Bacterial Vaginitis (gardnerella): POSITIVE — AB
Candida Glabrata: NEGATIVE
Candida Vaginitis: POSITIVE — AB
Chlamydia: NEGATIVE
Comment: NEGATIVE
Comment: NEGATIVE
Comment: NEGATIVE
Comment: NEGATIVE
Comment: NEGATIVE
Comment: NORMAL
Neisseria Gonorrhea: NEGATIVE
Trichomonas: NEGATIVE

## 2020-03-18 MED ORDER — METRONIDAZOLE 500 MG PO TABS: 500.0000 mg | ORAL_TABLET | Freq: Two times a day (BID) | ORAL | 0 refills | Status: DC

## 2020-06-30 ENCOUNTER — Ambulatory Visit (INDEPENDENT_AMBULATORY_CARE_PROVIDER_SITE_OTHER): Payer: 59

## 2020-06-30 ENCOUNTER — Other Ambulatory Visit (HOSPITAL_COMMUNITY)
Admission: RE | Admit: 2020-06-30 | Discharge: 2020-06-30 | Disposition: A | Discharge status: Home / Self Care | Payer: No Typology Code available for payment source | Source: Ambulatory Visit

## 2020-06-30 ENCOUNTER — Other Ambulatory Visit: Payer: Self-pay

## 2020-06-30 VITALS — BP 142/98 | HR 85 | Ht 66.0 in | Wt 261.4 lb

## 2020-06-30 DIAGNOSIS — N898 Other specified noninflammatory disorders of vagina: Secondary | ICD-10-CM | POA: Diagnosis present

## 2020-06-30 DIAGNOSIS — R030 Elevated blood-pressure reading, without diagnosis of hypertension: Secondary | ICD-10-CM

## 2020-06-30 DIAGNOSIS — Z8619 Personal history of other infectious and parasitic diseases: Secondary | ICD-10-CM

## 2020-06-30 DIAGNOSIS — Z6841 Body Mass Index (BMI) 40.0 and over, adult: Secondary | ICD-10-CM

## 2020-06-30 MED ORDER — CLOTRIMAZOLE 1 % VA CREA
1.0000 | TOPICAL_CREAM | Freq: Every day | VAGINAL | 2 refills | Status: DC
Start: 2020-06-30 — End: 2020-11-25

## 2020-06-30 MED ORDER — VALACYCLOVIR HCL 500 MG PO TABS
500.0000 mg | ORAL_TABLET | Freq: Every day | ORAL | 3 refills | Status: DC
Start: 2020-06-30 — End: 2022-02-07

## 2020-06-30 NOTE — Progress Notes (Signed)
GYNECOLOGY PROBLEM OFFICE VISIT NOTE  History:  Caitlin Duncan is a 30 y.o. G1P0010 here today for vaginal itching. She states she has been experiencing her symptoms for "about 3 weeks."  She reports that she was having discharge, but that has subsided.  She reports her periods have been "very very heavy."  She reports she uses pads that she changes at least every 4 hours and it is saturated.  She denies using scented sanitary napkins. She reports that this has been a recurrent problem and her symptoms usually start after her menstrual cycle.  Patient states she is not currently sexually active and has no abnormal vaginal discharge, bleeding, or pelvic pain.   Past Medical History:  Diagnosis Date  . Anemia   . ASCUS with positive high risk HPV 03/2013  . Chlamydia 06/2013  . Dysmenorrhea    resolved  . Vitamin D deficiency 03/2013    No past surgical history on file.  The following portions of the patient's history were reviewed and updated as appropriate: allergies, current medications, past family history, past medical history, past social history, past surgical history and problem list.   Health Maintenance:  Normal pap and negative HRHPV on April 2019 documented, but reports April 2022 with private practice.  No mammogram d/t age.   Review of Systems:  Genito-Urinary ROS: no dysuria, trouble voiding, or hematuria Gastrointestinal ROS: negative for - constipation, diarrhea, or nausea/vomiting Objective:  Vitals: BP (!) 142/98   Pulse 85   Ht 5\' 6"  (1.676 m)   Wt 261 lb 6.4 oz (118.6 kg)   LMP 05/31/2020 (Approximate)   BMI 42.19 kg/m   Physical Exam: Physical Exam Constitutional:      Appearance: Normal appearance. She is obese. She is not ill-appearing.  Genitourinary:     Right Labia: No rash, tenderness or lesions.    Left Labia: No tenderness, lesions or rash.    Vulva exam comments: Small pustules noted on thighs and gluteal folds c/w folliculitis vs HS. Non  tender..     Vaginal discharge present.     No vaginal tenderness.     Vaginal exam comments: CV collected.  HENT:     Head: Normocephalic and atraumatic.  Eyes:     Conjunctiva/sclera: Conjunctivae normal.  Cardiovascular:     Rate and Rhythm: Normal rate.  Pulmonary:     Effort: Pulmonary effort is normal. No respiratory distress.  Abdominal:     Tenderness: There is no abdominal tenderness.  Musculoskeletal:     Cervical back: Normal range of motion.  Neurological:     Mental Status: She is alert and oriented to person, place, and time.  Skin:    General: Skin is warm and dry.  Psychiatric:        Mood and Affect: Mood normal.        Behavior: Behavior normal.        Thought Content: Thought content normal.  Vitals reviewed.     Labs and Imaging: No results found.  Assessment & Plan:  30 year old Vaginal Itching H/O Recurrent Yeast Infections BMI >40 Elevated Blood Pressure H/O HSV  -Extensive discussion regarding possible causes for recurrent yeast infections including uncontrolled diabetes. -FS obtained and 103.  Patient reports she ate [employer] ~ 2 hours prior to obtaining sample.  -Encouraged patient to find a PCP for testing of HgB A1C and monitoring of blood pressure. -Discussed methods for treatment of recurrent yeast.  Instructed to discontinue usage of Diflucan. -  Will send initial prescription for Gyne-Lotrimin to start now. -Will await results and treat BV if present.   -Will then start on regimen of boric acid 600mg  PV x 21 days then once a week for 9 weeks.  -Patient verbalizes understanding and is agreeable with plan. -Informed that provider will verify and further discuss POC, via mychart, once all results received.  -Patient requests and given script for HSV.  Informed that she does not have to take daily unless desiring suppression.  Reviewed suppression vs treatment dosing.  -Given list for PCP. -Patient to RTO for annual exam and pap smear as  indicated.    Total face-to-face time with patient: 30 minutes Additional 6 minutes was spent reviewing chart including previous labs and encounters. Total time: 31 minutes  , [name] 06/30/2020 3:30 PM

## 2020-06-30 NOTE — Patient Instructions (Addendum)
AREA FAMILY PRACTICE PHYSICIANS  [place] ([address]) [facility] [address] [place], [place] [address] [phone] Mon-Fri 8:30-12:30, 1:30-5:00 Accepting Medicaid [facility] [address], [place], [place] [address] [phone] Mon-Fri 8:00-5:30 [facility] [address] [place], [place] [address] [phone] Mon, Tue, Thur, Fri 8:30-5:00, Wed 10:00-7:00 (closed 1-2pm) Accepting Medicaid [facility] [address], [place], [place]  [address] Phone - [phone]   Fax - [phone]  [place] ([address]) [facility] [address] [place], [place] [address] [phone] Mon-Fri 8:00-5:00 [facility] ([facility])  [address] [place], [place] [address] [phone] Mon-Fri 8:30-5:30, Sat (Oct.-Mar.) 9:00-1:00 Accepting Medicaid  [place] ([address]) [facility] [address], [place], [place] [address] [phone] Mon-Fri 8:00-5:00  [place] ([address]) [facility] [address], [place], [place] [address] [phone] Mon-Fri 8:00-5:00 [facility] [address], [place], [place] [address] [phone] Mon-Fri 8:00-5:00 [facility] [place] [facility] [address] [place], [place] [address] [phone] Mon-Fri 8:00-5:00 [facility] [address] [place] [address] [phone] Mon-Fri 7:30-5:30  [place] ([address] & [address]) [facility] [address] [place], [place] [address] [phone] Mon-Thur 8:00-6:00 Accepting Medicaid [facility] [address] [place], [place] [address] [phone] Mon-Thur 7:30-7:30, Fri 7:30-4:30 Accepting  Medicaid [facility] [address], [place], [place]  [address] [phone]   Fax - [phone]  [place] ([address] & [address]) [facility] [address] [place], [place] [address] [phone] Mon-Fri 7:00-5:00 [facility] [address], [place], [place] [address] [phone] Mon-Fri 8:00-5:00 Accepting Medicaid [facility] [address], [place], [place] [address] [phone] Mon-Fri 8:00-5:00 Accepting Medicaid  [place] ([address]) [facility] [place] [address] [place], [place] [address] [phone] Mon-Fri 8:00-5:00 [facility] [address], [place], [place] [address] [phone] Mon-Fri 8:00-5:00 Accepting Medicaid [facility] ([facility]) [address], [place], [place] [address] [phone] Mon-Fri 8:00-5:30, Sat&Sun by appointment (phones open at 8:30) Accepting Medicaid  [place] ([address]) [facility] [address] [place], [place] [address] [phone] Mon-Thur 8:00-7:00, Fri 8:00-5:00, Sat 8:00-12:00, Sun 9:00-12:00 Accepting Medicaid [facility] [address], [place], [place] [address] [phone] Mon-Thur 8:00-5:00 Accepting Medicaid [facility] [address] [place], [place] [address] [phone] Mon-Fri 8:00-5:30, Sat (Oct.-Mar.) 9:00-1:00 Accepting Medicaid  [place] ([address]) [facility] [address], [place], [place] [address] [phone] Mon-Fri 8:00-5:00 Accepting Medicaid   [place] ([address]) [facility] [place] [address], [place], [place] [address] [phone] Mon-Fri 8:00-5:00  [facility] [place] [address], [place], [place] [address] [phone] Mon-Fri 8:00-5:00 [facility] [place] [address] [place] [address], [place], [place] [address] [phone] Mon-Fri 8:00-5:00 After hours clinic ([address] [place], [place] [address]) [phone] Mon-Fri 5:00-8:00, Sat 12:00-6:00, Sun 10:00-4:00 Accepting Medicaid [facility] [place]   [address] [place], [place]  [address] [phone]   Fax - [phone]  [place] [phone]) Adult nurse [facility] [address] [place], [place], [place] [address] [phone] Mon-Fri 8:00-5:00 [facility] [place] [facility]) [address] [place] [address], [place], [place] [address] [phone] Mon-Thur 8:00-7:00, Fri 8:00-5:00, [place] 8:00-12:00   Gwinda Passe, NP [facility] [address] [place],  [place]  [address] Get Driving Directions Main: [phone]

## 2020-06-30 NOTE — Progress Notes (Signed)
Reports thinks she has a yeast infection. Has discharge, but says it comes and goes. Reports itching and "small bumps on the inside of my vagina."   Needs RX valtrex, states it is expired.  Had a PAP with Dr. Mora Appl, in April.

## 2020-07-01 LAB — POCT CBG MONITORING: CBG: 103

## 2020-07-11 ENCOUNTER — Other Ambulatory Visit: Payer: Self-pay | Admitting: Medical

## 2020-07-11 DIAGNOSIS — B9689 Other specified bacterial agents as the cause of diseases classified elsewhere: Secondary | ICD-10-CM

## 2020-07-11 DIAGNOSIS — N760 Acute vaginitis: Secondary | ICD-10-CM

## 2020-07-11 LAB — CERVICOVAGINAL ANCILLARY ONLY
Bacterial Vaginitis (gardnerella): POSITIVE — AB
Candida Glabrata: NEGATIVE
Candida Vaginitis: NEGATIVE
Comment: NEGATIVE
Comment: NEGATIVE
Comment: NEGATIVE

## 2020-07-11 MED ORDER — METRONIDAZOLE 500 MG PO TABS: 500.0000 mg | ORAL_TABLET | Freq: Two times a day (BID) | ORAL | 0 refills | Status: DC

## 2020-10-03 ENCOUNTER — Ambulatory Visit (INDEPENDENT_AMBULATORY_CARE_PROVIDER_SITE_OTHER): Payer: 59 | Admitting: Primary Care

## 2020-11-21 ENCOUNTER — Emergency Department (HOSPITAL_BASED_OUTPATIENT_CLINIC_OR_DEPARTMENT_OTHER): Payer: No Typology Code available for payment source

## 2020-11-21 ENCOUNTER — Other Ambulatory Visit: Payer: Self-pay

## 2020-11-21 ENCOUNTER — Emergency Department (HOSPITAL_BASED_OUTPATIENT_CLINIC_OR_DEPARTMENT_OTHER)
Admission: EM | Admit: 2020-11-21 | Discharge: 2020-11-21 | Disposition: A | Discharge status: Home / Self Care | Payer: No Typology Code available for payment source | Attending: Emergency Medicine | Admitting: Emergency Medicine

## 2020-11-21 ENCOUNTER — Encounter (HOSPITAL_BASED_OUTPATIENT_CLINIC_OR_DEPARTMENT_OTHER): Payer: Self-pay

## 2020-11-21 DIAGNOSIS — R55 Syncope and collapse: Secondary | ICD-10-CM | POA: Insufficient documentation

## 2020-11-21 DIAGNOSIS — D649 Anemia, unspecified: Secondary | ICD-10-CM | POA: Insufficient documentation

## 2020-11-21 DIAGNOSIS — Z3A00 Weeks of gestation of pregnancy not specified: Secondary | ICD-10-CM | POA: Diagnosis not present

## 2020-11-21 DIAGNOSIS — O209 Hemorrhage in early pregnancy, unspecified: Secondary | ICD-10-CM | POA: Insufficient documentation

## 2020-11-21 DIAGNOSIS — N939 Abnormal uterine and vaginal bleeding, unspecified: Secondary | ICD-10-CM

## 2020-11-21 DIAGNOSIS — O4690 Antepartum hemorrhage, unspecified, unspecified trimester: Secondary | ICD-10-CM

## 2020-11-21 DIAGNOSIS — N9489 Other specified conditions associated with female genital organs and menstrual cycle: Secondary | ICD-10-CM | POA: Diagnosis not present

## 2020-11-21 HISTORY — DX: Endometriosis, unspecified: N80.9

## 2020-11-21 LAB — URINALYSIS, ROUTINE W REFLEX MICROSCOPIC
Bilirubin Urine: NEGATIVE
Glucose, UA: NEGATIVE mg/dL
Nitrite: NEGATIVE
Protein, ur: 100 mg/dL — AB
RBC / HPF: >50 RBC/hpf — ABNORMAL HIGH (ref 0–5)
Specific Gravity, Urine: 1.027 (ref 1.005–1.030)
WBC, UA: >50 WBC/hpf — ABNORMAL HIGH (ref 0–5)
pH: 6 (ref 5.0–8.0)

## 2020-11-21 LAB — HEMOGLOBIN AND HEMATOCRIT, BLOOD
HCT: 27.4 % — ABNORMAL LOW (ref 36.0–46.0)
Hemoglobin: 8.9 g/dL — ABNORMAL LOW (ref 12.0–15.0)

## 2020-11-21 LAB — HCG, QUANTITATIVE, PREGNANCY: hCG, Beta Chain, Quant, S: 3038 m[IU]/mL — ABNORMAL HIGH (ref <5)

## 2020-11-21 MED ORDER — HYDROCODONE-ACETAMINOPHEN 5-325 MG PO TABS
1.0000 | ORAL_TABLET | Freq: Once | ORAL | Status: AC
  Administered 2020-11-21: 1 via ORAL
  Filled 2020-11-21: qty 1

## 2020-11-21 NOTE — ED Notes (Signed)
Pt given water and instructed on need to obtain UA.

## 2020-11-21 NOTE — Discharge Instructions (Addendum)
You were seen today for vaginal bleeding.  You were noted to be pregnant.  Your ultrasound is inconclusive.  Given your clinical presentation, I am highly suspicious that you may have completed a miscarriage.  However, you need to monitor for signs and symptoms of worsening bleeding.  If you begin to soak 1 pad per hour, you should be reevaluated.  Additionally, if you have onset of new abdominal pain, you should be reevaluated immediately for an ectopic pregnancy.  Follow-up with your OB/GYN within 1 week for repeat beta-hCG and ultrasound.  I also recommend that you have repeat hemoglobin.

## 2020-11-21 NOTE — ED Notes (Signed)
US at the bedside

## 2020-11-21 NOTE — ED Triage Notes (Addendum)
Pt is present for bright red vaginal bleeding x three days. Period started nine days ago. Pt states she occasionally will have a heavy period but it has never bled this much. Pt began to pass multiple quarter-sized blood clots this evening. Has had to change pad every two hours. Pt began to get lightheaded this evening and has a history of endometriosis.

## 2020-11-21 NOTE — ED Provider Notes (Signed)
[facility] EMERGENCY DEPT Provider Note   CSN: [id number] Arrival date & time: 11/21/20  [id number]     History Chief Complaint  Patient presents with   Vaginal Bleeding    Caitlin Duncan is a 30 y.o. female.  HPI     This is a 30 year old female with a history of endometriosis who presents with heavy bleeding.  Patient reports she has been on her menstrual period for the last 9 days.  She has a history of heavy periods; however, she states that she is currently passing multiple quarter size clots.  She states over the last 2 hours she has bled through a pad, her underwear, and her pants.  She reports near syncope and dizziness.  Denies any abdominal pain.  She does report some cramping which she says is not abnormal during her cycles.  She does not believe that she is pregnant.  However, she is sexually active.  Reports that this is normal timing for her menstrual periods.  Past Medical History:  Diagnosis Date   Anemia    ASCUS with positive high risk HPV 03/22/2013   Chlamydia 06/22/2013   Dysmenorrhea    resolved   Endometriosis    Vitamin D deficiency 03/22/2013    Patient Active Problem List   Diagnosis Date Noted   ASCUS with positive high risk HPV 04/30/2013   Vitamin D deficiency 04/14/2013   Obesity (BMI 30-39.9) 04/13/2013   Unspecified contraceptive management 04/13/2013   Anemia, unspecified 01/17/2011   Heavy periods 01/17/2011   Anemia 01/01/2011   Dysmenorrhea 10/25/2010    History reviewed. No pertinent surgical history.   OB History     Gravida  1   Para  0   Term  0   Preterm  0   AB  1   Living  0      SAB  0   IAB  1   Ectopic  0   Multiple  0   Live Births              Family History  Problem Relation Age of Onset   Hypertension Mother    Kidney failure Mother        kidney transplant 2014 (related to pregnancy)   Hypertension Sister    Diabetes Neg Hx    Cancer Neg Hx    Heart disease Neg Hx      Social History   Tobacco Use   Smoking status: Never   Smokeless tobacco: Never  Vaping Use   Vaping Use: Never used  Substance Use Topics   Alcohol use: Not Currently    Alcohol/week: 1.0 standard drink    Types: 1 Glasses of wine per week    Comment: 4-5 oz of wine/week   Drug use: No    Home Medications Prior to Admission medications   Medication Sig Start Date End Date Taking? Authorizing Provider  albuterol (VENTOLIN HFA) 108 (90 Base) MCG/ACT inhaler Inhale 2 puffs into the lungs every 4 (four) hours as needed for wheezing or shortness of breath. 08/12/19   Faustino Congress, NP  clotrimazole (GYNE-LOTRIMIN) 1 % vaginal cream Place 1 Applicatorful vaginally at bedtime. 06/30/20   Gavin Pound, CNM  metroNIDAZOLE (FLAGYL) 500 MG tablet Take 1 tablet (500 mg total) by mouth 2 (two) times daily. 07/11/20   Luvenia Redden, PA-C  naproxen (NAPROSYN) 500 MG tablet Take 1 tablet (500 mg total) by mouth 2 (two) times daily. 03/17/20   Wieters, Elesa Hacker, PA-C  valACYclovir (VALTREX) 500 MG tablet Take 1 tablet (500 mg total) by mouth daily. Can increase to twice a day for 5 days in the event of a recurrence 06/30/20   Gerrit HeckEmly, Jessica, CNM  cetirizine (ZYRTEC ALLERGY) 10 MG tablet Take 1 tablet (10 mg total) by mouth daily. 05/15/19 06/14/19  Hall-Potvin, [place], PA-C  fluticasone (FLONASE) 50 MCG/ACT nasal spray Place 1 spray into both nostrils daily. 05/15/19 06/14/19  Hall-Potvin, [place], PA-C    Allergies    Patient has no known allergies.  Review of Systems   Review of Systems  Constitutional:  Negative for fever.  Respiratory:  Negative for shortness of breath.   Cardiovascular:  Negative for chest pain.  Gastrointestinal:  Negative for abdominal pain.  Genitourinary:  Positive for vaginal bleeding. Negative for dysuria.  Neurological:  Positive for dizziness and light-headedness.  All other systems reviewed and are negative.  Physical Exam Updated Vital Signs BP (!)  148/75   Pulse 98   Temp 98.1 F (36.7 C) (Oral)   Resp 18   Ht 1.676 m (5\' 6" )   Wt 109.8 kg   LMP 11/21/2020 (Exact Date)   SpO2 100%   BMI 39.06 kg/m   Physical Exam Vitals and nursing note reviewed.  Constitutional:      Appearance: She is well-developed. She is obese. She is not ill-appearing.  HENT:     Head: Normocephalic and atraumatic.     Mouth/Throat:     Mouth: Mucous membranes are moist.  Eyes:     Pupils: Pupils are equal, round, and reactive to light.  Cardiovascular:     Rate and Rhythm: Normal rate and regular rhythm.     Heart sounds: Normal heart sounds.  Pulmonary:     Effort: Pulmonary effort is normal. No respiratory distress.     Breath sounds: No wheezing.  Abdominal:     General: Bowel sounds are normal.     Palpations: Abdomen is soft.     Tenderness: There is no abdominal tenderness. There is no guarding or rebound.  Genitourinary:    Comments: Normal external vaginal exam, scant blood noted in the vaginal vault, no products of conception or clot noted, cervical os closed Musculoskeletal:     Cervical back: Neck supple.  Skin:    General: Skin is warm and dry.  Neurological:     Mental Status: She is alert and oriented to person, place, and time.  Psychiatric:        Mood and Affect: Mood normal.    ED Results / Procedures / Treatments   Labs (all labs ordered are listed, but only abnormal results are displayed) Labs Reviewed  URINALYSIS, ROUTINE W REFLEX MICROSCOPIC - Abnormal; Notable for the following components:      Result Value   Color, Urine ORANGE (*)    APPearance CLOUDY (*)    Hgb urine dipstick LARGE (*)    Ketones, ur TRACE (*)    Protein, ur 100 (*)    Leukocytes,Ua LARGE (*)    RBC / HPF >50 (*)    WBC, UA >50 (*)    All other components within normal limits  HEMOGLOBIN AND HEMATOCRIT, BLOOD - Abnormal; Notable for the following components:   Hemoglobin 8.9 (*)    HCT 27.4 (*)    All other components within normal  limits  HCG, QUANTITATIVE, PREGNANCY - Abnormal; Notable for the following components:   hCG, Beta Chain, Quant, S 3,038 (*)    All other components within  normal limits    EKG None  Radiology US OB LESS THAN 14 WEEKS WITH OB TRANSVAGINAL  Result Date: 11/21/2020 CLINICAL DATA:  Vaginal bleeding.  Unknown quantitative HCG EXAM: OBSTETRIC <14 WK [place] AND TRANSVAGINAL OB US TECHNIQUE: Both transabdominal and transvaginal ultrasound examinations were performed for complete evaluation of the gestation as well as the maternal uterus, adnexal regions, and pelvic cul-de-sac. Transvaginal technique was performed to assess early pregnancy. COMPARISON:  None. FINDINGS: No visible intra or extra uterine gestational sac. Thickened and heterogeneous endometrial cavity measuring up to 2 cm. Intramural fibroid measuring up to 2 cm posteriorly at the body. No extra ovarian adnexal mass or hemoperitoneum. IMPRESSION: 1. Pregnancy of unknown location. Differential considerations include intrauterine gestation too early to be sonographically visualized, spontaneous abortion, or ectopic pregnancy. Consider follow-up ultrasound in 10 days and serial quantitative beta HCG follow-up. 2. Heterogeneous 2 cm endometrial stripe. 3. 2 cm intramural fibroid. Electronically Signed   By: Tiburcio Pea M.D.   On: 11/21/2020 06:15    Procedures Procedures   Medications Ordered in ED Medications  HYDROcodone-acetaminophen (NORCO/VICODIN) 5-325 MG per tablet 1 tablet (has no administration in time range)    ED Course  I have reviewed the triage vital signs and the nursing notes.  Pertinent labs & imaging results that were available during my care of the patient were reviewed by me and considered in my medical decision making (see chart for details).  Clinical Course as of 11/21/20 0641  Mon Nov 21, 2020  7902 Patient has had significant improvement of bleeding while here.  No clots noted in the cervical canal.  No  products of conception in the os.  Highly suspect she may have completed a miscarriage.  However, patient was given strict return precautions regarding signs and symptoms of ectopic.  She was also instructed to follow-up with OB/GYN for repeat hemoglobin, beta hCG, and ultrasound. [CH]    Clinical Course User Index [CH] Cheskel Silverio, Mayer Masker, MD   MDM Rules/Calculators/A&P                           Patient presents with vaginal bleeding.  She is overall nontoxic and vital signs are reassuring.  She reports some dizziness but is not orthostatic or hypotensive.  She reports that this is normal timing for her.  But just prolonged.  Hemoglobin is 8.9.  Last hemoglobin was over 4 years ago and was normal at 12.  Beta-hCG is positive greater than 3000.  Patient did not know she was pregnant.  Ultrasound obtained.  Ultrasound was inconclusive.  No intrauterine or ectopic noted.  Of note, there is no note of pelvic free fluid.  See clinical course above.  Patient has had improvement of her bleeding while she has been here.  She has a closed os and vaginal bleeding has significantly improved.  She has no reproducible or notable abdominal tenderness on exam.  I highly suspect her presentation is a result of a miscarriage likely recently completed; however, given ultrasound findings, ectopic is still a consideration.  I had a lengthy discussion with the patient and her mother.  She was given strict return precautions.  She states she does have an OB/GYN and will follow-up for repeat beta-hCG levels, ultrasound, and hemoglobin.  She has remained hemodynamically stable and abdomen has remained benign.  After history, exam, and medical workup I feel the patient has been appropriately medically screened and is safe for discharge home.  Pertinent diagnoses were discussed with the patient. Patient was given return precautions.  Final Clinical Impression(s) / ED Diagnoses Final diagnoses:  Vaginal bleeding during pregnancy   Anemia, unspecified type    Rx / DC Orders ED Discharge Orders     None        Merryl Hacker, MD 11/21/20 [phone]

## 2020-11-21 NOTE — ED Notes (Signed)
Pt verbalizes understanding of discharge instructions. Opportunity for questioning and answers were provided. Armand removed by staff, pt discharged from ED to home. Educated to f/u with OBGYN for f/u HCG and [place].

## 2020-11-25 ENCOUNTER — Encounter (HOSPITAL_COMMUNITY): Payer: Self-pay | Admitting: *Deleted

## 2020-11-25 ENCOUNTER — Encounter (HOSPITAL_COMMUNITY)
Admission: AD | Disposition: A | Discharge status: Home / Self Care | Payer: Self-pay | Source: Home / Self Care | Attending: Obstetrics and Gynecology

## 2020-11-25 ENCOUNTER — Inpatient Hospital Stay (HOSPITAL_COMMUNITY): Payer: No Typology Code available for payment source | Admitting: Anesthesiology

## 2020-11-25 ENCOUNTER — Inpatient Hospital Stay (HOSPITAL_COMMUNITY): Payer: No Typology Code available for payment source

## 2020-11-25 ENCOUNTER — Inpatient Hospital Stay (HOSPITAL_COMMUNITY)
Admission: AD | Admit: 2020-11-25 | Discharge: 2020-11-25 | Disposition: A | Discharge status: Home / Self Care | Payer: No Typology Code available for payment source | Attending: Obstetrics and Gynecology | Admitting: Obstetrics and Gynecology

## 2020-11-25 DIAGNOSIS — O99011 Anemia complicating pregnancy, first trimester: Secondary | ICD-10-CM | POA: Diagnosis not present

## 2020-11-25 DIAGNOSIS — O031 Delayed or excessive hemorrhage following incomplete spontaneous abortion: Secondary | ICD-10-CM | POA: Insufficient documentation

## 2020-11-25 DIAGNOSIS — O039 Complete or unspecified spontaneous abortion without complication: Secondary | ICD-10-CM | POA: Diagnosis not present

## 2020-11-25 DIAGNOSIS — Z6790 Unspecified blood type, Rh positive: Secondary | ICD-10-CM

## 2020-11-25 DIAGNOSIS — O4690 Antepartum hemorrhage, unspecified, unspecified trimester: Secondary | ICD-10-CM

## 2020-11-25 DIAGNOSIS — Z20822 Contact with and (suspected) exposure to covid-19: Secondary | ICD-10-CM | POA: Diagnosis not present

## 2020-11-25 DIAGNOSIS — Z3A08 8 weeks gestation of pregnancy: Secondary | ICD-10-CM | POA: Diagnosis not present

## 2020-11-25 HISTORY — PX: DILATION AND CURETTAGE OF UTERUS: SHX78

## 2020-11-25 LAB — TYPE AND SCREEN
ABO/RH(D): B POS
Antibody Screen: NEGATIVE

## 2020-11-25 LAB — COMPREHENSIVE METABOLIC PANEL
ALT: 22 U/L (ref 0–44)
AST: 36 U/L (ref 15–41)
Albumin: 3.1 g/dL — ABNORMAL LOW (ref 3.5–5.0)
Alkaline Phosphatase: 54 U/L (ref 38–126)
Anion gap: 11 (ref 5–15)
BUN: 6 mg/dL (ref 6–20)
CO2: 20 mmol/L — ABNORMAL LOW (ref 22–32)
Calcium: 8.9 mg/dL (ref 8.9–10.3)
Chloride: 105 mmol/L (ref 98–111)
Creatinine, Ser: 0.78 mg/dL (ref 0.44–1.00)
GFR, Estimated: >60 mL/min (ref >60)
Glucose, Bld: 114 mg/dL — ABNORMAL HIGH (ref 70–99)
Potassium: 3.8 mmol/L (ref 3.5–5.1)
Sodium: 136 mmol/L (ref 135–145)
Total Bilirubin: 0.3 mg/dL (ref 0.3–1.2)
Total Protein: 6.9 g/dL (ref 6.5–8.1)

## 2020-11-25 LAB — CBC
HCT: 25.6 % — ABNORMAL LOW (ref 36.0–46.0)
Hemoglobin: 8.1 g/dL — ABNORMAL LOW (ref 12.0–15.0)
MCH: 28.7 pg (ref 26.0–34.0)
MCHC: 31.6 g/dL (ref 30.0–36.0)
MCV: 90.8 fL (ref 80.0–100.0)
Platelets: 274 10*3/uL (ref 150–400)
RBC: 2.82 MIL/uL — ABNORMAL LOW (ref 3.87–5.11)
RDW: 13.7 % (ref 11.5–15.5)
WBC: 11.9 10*3/uL — ABNORMAL HIGH (ref 4.0–10.5)
nRBC: 0.2 % (ref 0.0–0.2)

## 2020-11-25 LAB — RESP PANEL BY RT-PCR (FLU A&B, COVID) ARPGX2
Influenza A by PCR: NEGATIVE
Influenza B by PCR: NEGATIVE
SARS Coronavirus 2 by RT PCR: NEGATIVE

## 2020-11-25 LAB — HCG, QUANTITATIVE, PREGNANCY: hCG, Beta Chain, Quant, S: 587 m[IU]/mL — ABNORMAL HIGH (ref <5)

## 2020-11-25 SURGERY — DILATION AND CURETTAGE
Anesthesia: General | Site: Vagina

## 2020-11-25 MED ORDER — FENTANYL CITRATE (PF) 100 MCG/2ML IJ SOLN: 25.0000 ug | INTRAMUSCULAR | Status: DC | PRN

## 2020-11-25 MED ORDER — ORAL CARE MOUTH RINSE: 15.0000 mL | Freq: Once | OROMUCOSAL | Status: AC

## 2020-11-25 MED ORDER — LIDOCAINE HCL 2 % IJ SOLN
INTRAMUSCULAR | Status: AC
  Filled 2020-11-25: qty 20

## 2020-11-25 MED ORDER — IBUPROFEN 800 MG PO TABS: 800.0000 mg | ORAL_TABLET | Freq: Three times a day (TID) | ORAL | 0 refills | Status: DC | PRN

## 2020-11-25 MED ORDER — ACETAMINOPHEN 500 MG PO TABS
ORAL_TABLET | ORAL | Status: AC
  Filled 2020-11-25: qty 2

## 2020-11-25 MED ORDER — ONDANSETRON HCL 4 MG/2ML IJ SOLN
INTRAMUSCULAR | Status: AC
  Filled 2020-11-25: qty 2

## 2020-11-25 MED ORDER — LACTATED RINGERS IV BOLUS
1000.0000 mL | Freq: Once | INTRAVENOUS | Status: AC
  Administered 2020-11-25: 1000 mL via INTRAVENOUS

## 2020-11-25 MED ORDER — ONDANSETRON HCL 4 MG/2ML IJ SOLN
INTRAMUSCULAR | Status: DC | PRN
  Administered 2020-11-25: 4 mg via INTRAVENOUS

## 2020-11-25 MED ORDER — LACTATED RINGERS IV SOLN: INTRAVENOUS | Status: DC

## 2020-11-25 MED ORDER — MISOPROSTOL 200 MCG PO TABS
ORAL_TABLET | ORAL | Status: AC
  Filled 2020-11-25: qty 1

## 2020-11-25 MED ORDER — PROPOFOL 10 MG/ML IV BOLUS
INTRAVENOUS | Status: AC
  Filled 2020-11-25: qty 20

## 2020-11-25 MED ORDER — FENTANYL CITRATE (PF) 250 MCG/5ML IJ SOLN
INTRAMUSCULAR | Status: AC
  Filled 2020-11-25: qty 5

## 2020-11-25 MED ORDER — MISOPROSTOL 200 MCG PO TABS
ORAL_TABLET | ORAL | Status: AC
  Filled 2020-11-25: qty 4

## 2020-11-25 MED ORDER — CHLORHEXIDINE GLUCONATE 0.12 % MT SOLN: 15.0000 mL | Freq: Once | OROMUCOSAL | Status: AC

## 2020-11-25 MED ORDER — HYDROMORPHONE HCL 1 MG/ML IJ SOLN
1.0000 mg | Freq: Once | INTRAMUSCULAR | Status: AC
  Administered 2020-11-25: 1 mg via INTRAVENOUS
  Filled 2020-11-25: qty 1

## 2020-11-25 MED ORDER — MIDAZOLAM HCL 2 MG/2ML IJ SOLN
INTRAMUSCULAR | Status: AC
  Filled 2020-11-25: qty 2

## 2020-11-25 MED ORDER — CEFAZOLIN SODIUM-DEXTROSE 2-4 GM/100ML-% IV SOLN
2.0000 g | INTRAVENOUS | Status: AC
  Administered 2020-11-25: 2 g via INTRAVENOUS

## 2020-11-25 MED ORDER — ACETAMINOPHEN 500 MG PO TABS
1000.0000 mg | ORAL_TABLET | Freq: Once | ORAL | Status: AC
  Administered 2020-11-25: 1000 mg via ORAL

## 2020-11-25 MED ORDER — KETOROLAC TROMETHAMINE 30 MG/ML IJ SOLN
30.0000 mg | Freq: Once | INTRAMUSCULAR | Status: AC
  Administered 2020-11-25: 30 mg via INTRAVENOUS
  Filled 2020-11-25: qty 1

## 2020-11-25 MED ORDER — METHYLERGONOVINE MALEATE 0.2 MG/ML IJ SOLN
INTRAMUSCULAR | Status: DC | PRN
  Administered 2020-11-25: .2 mg via INTRAMUSCULAR

## 2020-11-25 MED ORDER — PROPOFOL 10 MG/ML IV BOLUS
INTRAVENOUS | Status: DC | PRN
  Administered 2020-11-25: 200 mg via INTRAVENOUS

## 2020-11-25 MED ORDER — DEXAMETHASONE SODIUM PHOSPHATE 10 MG/ML IJ SOLN
INTRAMUSCULAR | Status: DC | PRN
  Administered 2020-11-25: 5 mg via INTRAVENOUS

## 2020-11-25 MED ORDER — CHLORHEXIDINE GLUCONATE 0.12 % MT SOLN
OROMUCOSAL | Status: AC
  Administered 2020-11-25: 15 mL via OROMUCOSAL
  Filled 2020-11-25: qty 15

## 2020-11-25 MED ORDER — METHYLERGONOVINE MALEATE 0.2 MG/ML IJ SOLN
INTRAMUSCULAR | Status: AC
  Filled 2020-11-25: qty 1

## 2020-11-25 MED ORDER — MIDAZOLAM HCL 2 MG/2ML IJ SOLN
INTRAMUSCULAR | Status: DC | PRN
  Administered 2020-11-25: 2 mg via INTRAVENOUS

## 2020-11-25 MED ORDER — MISOPROSTOL 100 MCG PO TABS
ORAL_TABLET | ORAL | Status: DC | PRN
  Administered 2020-11-25: 800 ug

## 2020-11-25 MED ORDER — OXYCODONE-ACETAMINOPHEN 5-325 MG PO TABS: 1.0000 | ORAL_TABLET | Freq: Four times a day (QID) | ORAL | 0 refills | Status: DC | PRN

## 2020-11-25 MED ORDER — CEFAZOLIN SODIUM-DEXTROSE 2-4 GM/100ML-% IV SOLN
INTRAVENOUS | Status: AC
  Filled 2020-11-25: qty 100

## 2020-11-25 MED ORDER — OXYCODONE-ACETAMINOPHEN 5-325 MG PO TABS: 1.0000 | ORAL_TABLET | ORAL | 0 refills | Status: DC | PRN

## 2020-11-25 MED ORDER — PHENYLEPHRINE 40 MCG/ML (10ML) SYRINGE FOR IV PUSH (FOR BLOOD PRESSURE SUPPORT)
PREFILLED_SYRINGE | INTRAVENOUS | Status: AC
  Filled 2020-11-25: qty 10

## 2020-11-25 MED ORDER — LIDOCAINE 2% (20 MG/ML) 5 ML SYRINGE
INTRAMUSCULAR | Status: DC | PRN
  Administered 2020-11-25: 80 mg via INTRAVENOUS

## 2020-11-25 MED ORDER — FENTANYL CITRATE (PF) 100 MCG/2ML IJ SOLN
INTRAMUSCULAR | Status: DC | PRN
  Administered 2020-11-25: 50 ug via INTRAVENOUS
  Administered 2020-11-25: 25 ug via INTRAVENOUS

## 2020-11-25 SURGICAL SUPPLY — 15 items
CATH ROBINSON RED A/P 16FR (CATHETERS) ×2 IMPLANT
CNTNR URN SCR LID CUP LEK RST (MISCELLANEOUS) ×1 IMPLANT
CONT SPEC 4OZ STRL OR WHT (MISCELLANEOUS) ×2
DRSG TELFA 3X8 NADH (GAUZE/BANDAGES/DRESSINGS) ×2 IMPLANT
GLOVE SURG ENC MOIS LTX SZ7.5 (GLOVE) ×2 IMPLANT
GLOVE SURG UNDER POLY LF SZ7 (GLOVE) ×2 IMPLANT
GLOVE SURG UNDER POLY LF SZ7.5 (GLOVE) ×2 IMPLANT
GOWN STRL REUS W/ TWL LRG LVL3 (GOWN DISPOSABLE) ×2 IMPLANT
GOWN STRL REUS W/TWL LRG LVL3 (GOWN DISPOSABLE) ×4
PACK VAGINAL MINOR WOMEN LF (CUSTOM PROCEDURE TRAY) ×2 IMPLANT
PAD DRESSING TELFA 3X8 NADH (GAUZE/BANDAGES/DRESSINGS) IMPLANT
PAD OB MATERNITY 4.3X12.25 (PERSONAL CARE ITEMS) ×2 IMPLANT
TOWEL GREEN STERILE FF (TOWEL DISPOSABLE) ×4 IMPLANT
UNDERPAD 30X36 HEAVY ABSORB (UNDERPADS AND DIAPERS) ×2 IMPLANT
VACURETTE 8 RIGID CVD (CANNULA) ×1 IMPLANT

## 2020-11-25 NOTE — Anesthesia Preprocedure Evaluation (Addendum)
Anesthesia Evaluation  Patient identified by MRN, date of birth, ID band Patient awake    Reviewed: Allergy & Precautions, NPO status , Patient's Chart, lab work & pertinent test results  Airway Mallampati: I  TM Distance: >3 FB Neck ROM: Full    Dental no notable dental hx. (+) Teeth Intact, Dental Advisory Given   Pulmonary neg pulmonary ROS,    Pulmonary exam normal breath sounds clear to auscultation       Cardiovascular negative cardio ROS Normal cardiovascular exam Rhythm:Regular Rate:Normal     Neuro/Psych negative neurological ROS  negative psych ROS   GI/Hepatic negative GI ROS, Neg liver ROS,   Endo/Other  negative endocrine ROS  Renal/GU negative Renal ROS  negative genitourinary   Musculoskeletal negative musculoskeletal ROS (+)   Abdominal   Peds  Hematology  (+) Blood dyscrasia (Hgb 8.1), anemia ,   Anesthesia Other Findings [redacted]weeks gestation presenting with missed AB  Reproductive/Obstetrics                            Anesthesia Physical Anesthesia Plan  ASA: 2  Anesthesia Plan: General   Post-op Pain Management:    Induction: Intravenous  PONV Risk Score and Plan: 3 and Midazolam, Dexamethasone and Ondansetron  Airway Management Planned: LMA  Additional Equipment:   Intra-op Plan:   Post-operative Plan: Extubation in OR  Informed Consent: I have reviewed the patients History and Physical, chart, labs and discussed the procedure including the risks, benefits and alternatives for the proposed anesthesia with the patient or authorized representative who has indicated his/her understanding and acceptance.     Dental advisory given  Plan Discussed with: CRNA  Anesthesia Plan Comments:        Anesthesia Quick Evaluation

## 2020-11-25 NOTE — Anesthesia Postprocedure Evaluation (Signed)
Anesthesia Post Note  Patient: Caitlin Duncan  Procedure(s) Performed: SUCTION DILATATION AND CURETTAGE (Vagina )     Patient location during evaluation: PACU Anesthesia Type: General Level of consciousness: awake and alert Pain management: pain level controlled Vital Signs Assessment: post-procedure vital signs reviewed and stable Respiratory status: spontaneous breathing, nonlabored ventilation, respiratory function stable and patient connected to nasal cannula oxygen Cardiovascular status: blood pressure returned to baseline and stable Postop Assessment: no apparent nausea or vomiting Anesthetic complications: no   No notable events documented.  Last Vitals:  Vitals:   11/25/20 1725 11/25/20 1740  BP: 114/76   Pulse:  86  Resp: (!) 22 (!) 21  Temp:  (!) 36.3 C  SpO2: 100% 100%    Last Pain:  Vitals:   11/25/20 1740  TempSrc:   PainSc: 0-No pain                 Earl Lites P Floetta Brickey

## 2020-11-25 NOTE — MAU Note (Signed)
EMS arrival with vaginal bleeding an syncopal episode in the shower. Has had vaginal bleeding for several weeks gotten worse over this week. Was told she is having a mis carriage.

## 2020-11-25 NOTE — MAU Provider Note (Signed)
  History     CSN: [id number]  Arrival date and time: 11/25/20 1217   None     Chief Complaint  Patient presents with   Vaginal Bleeding   HPI Please see H&P by Donia Ast, NP.  She called reporting that she had spoken with her attending who recommended pt have a D&C.  OB History     Gravida  2   Para  0   Term  0   Preterm  0   AB  1   Living  0      SAB  0   IAB  1   Ectopic  0   Multiple  0   Live Births              Past Medical History:  Diagnosis Date   Anemia    ASCUS with positive high risk HPV 03/22/2013   Chlamydia 06/22/2013   Dysmenorrhea    resolved   Endometriosis    Vitamin D deficiency 03/22/2013    Past Surgical History:  Procedure Laterality Date   NO PAST SURGERIES      Family History  Problem Relation Age of Onset   Hypertension Mother    Kidney failure Mother        kidney transplant 2014 (related to pregnancy)   Hypertension Sister    Diabetes Neg Hx    Cancer Neg Hx    Heart disease Neg Hx     Social History   Tobacco Use   Smoking status: Never   Smokeless tobacco: Never  Vaping Use   Vaping Use: Never used  Substance Use Topics   Alcohol use: Not Currently    Alcohol/week: 1.0 standard drink    Types: 1 Glasses of wine per week    Comment: 4-5 oz of wine/week   Drug use: No    Allergies: No Known Allergies  Medications Prior to Admission  Medication Sig Dispense Refill Last Dose   ibuprofen (ADVIL) 800 MG tablet Take 800 mg by mouth every 8 (eight) hours as needed.      albuterol (VENTOLIN HFA) 108 (90 Base) MCG/ACT inhaler Inhale 2 puffs into the lungs every 4 (four) hours as needed for wheezing or shortness of breath. 18 g 0 More than a month   clotrimazole (GYNE-LOTRIMIN) 1 % vaginal cream Place 1 Applicatorful vaginally at bedtime. 30 g 2    metroNIDAZOLE (FLAGYL) 500 MG tablet Take 1 tablet (500 mg total) by mouth 2 (two) times daily. 14 tablet 0    naproxen (NAPROSYN) 500 MG tablet Take 1  tablet (500 mg total) by mouth 2 (two) times daily. 30 tablet 0    valACYclovir (VALTREX) 500 MG tablet Take 1 tablet (500 mg total) by mouth daily. Can increase to twice a day for 5 days in the event of a recurrence 30 tablet 3     Review of Systems Non-contributory Physical Exam   Blood pressure 120/72, pulse 93, temperature 98.2 F (36.8 C), resp. rate 18, last menstrual period 09/28/2020.  Physical Exam  Lungs CTA bilaterally CV RRR Abd appropriately tender Ext no calf tenderness Mild bleeding  MAU Course  Procedures  Scheduling suction D&C  Assessment and Plan  G2P0 at 8wks with probable incomplete AB.  Will do suction D&C today.  Risks benefits alternatives discussed with the patient including but not limited to bleeding infection and injury.  Questions answered and consent signed and witnessed.  Purcell Nails 11/25/2020, 3:13 PM

## 2020-11-25 NOTE — Anesthesia Procedure Notes (Signed)
Procedure Name: LMA Insertion Date/Time: 11/25/2020 4:27 PM Performed by: Aundria Rud, CRNA Pre-anesthesia Checklist: Patient identified, Emergency Drugs available, Suction available and Patient being monitored Patient Re-evaluated:Patient Re-evaluated prior to induction Oxygen Delivery Method: Circle System Utilized Preoxygenation: Pre-oxygenation with 100% oxygen Induction Type: IV induction Ventilation: Mask ventilation without difficulty LMA: LMA inserted LMA Size: 4.0 Number of attempts: 1 Airway Equipment and Method: Bite block Placement Confirmation: positive ETCO2 Tube secured with: Tape Dental Injury: Teeth and Oropharynx as per pre-operative assessment

## 2020-11-25 NOTE — MAU Provider Note (Addendum)
History     CSN: [id number]  Arrival date and time: 11/25/20 1217   None     Chief Complaint  Patient presents with   Vaginal Bleeding   Caitlin Duncan is a 30 y.o. G2P0010 at [redacted]w[redacted]d who presents to MAU for vaginal bleeding which began about one week ago. Patient states originally she thought it was her period, but reports it was a little heavier than usual. Patient states she was using about 4-5 pads/tampons per day. Patient states she had been passing clots throughout that entire time, but states that they were initially small. Patient states the bleeding and clotting increased on Friday, and then on Sunday she went to the ED at [facility] and was diagnosed with a pregnancy of unknown location and sent home without treatment. Patient was brought in today by EMS after a syncopal episode in the shower and she started passing lemon-sized clots. Syncopal episode was witnessed by mother - patient was sitting in the tub and did not fall or hit her head. Patient did not know she was pregnant until Sunday. Patient reports 12/10 cramping pain on arrival to MAU. Mother present for entire visit.  PMH? none Blood Type? B Positive Allergies? NKDA Current medications? none  Pt denies vaginal discharge/odor/itching. Pt denies N/V, abdominal pain, constipation, diarrhea, or urinary problems. Pt denies fever, chills, fatigue, sweating or changes in appetite. Pt denies SOB or chest pain. Pt denies dizziness, HA, light-headedness, weakness.   OB History     Gravida  2   Para  0   Term  0   Preterm  0   AB  1   Living  0      SAB  0   IAB  1   Ectopic  0   Multiple  0   Live Births              Past Medical History:  Diagnosis Date   Anemia    ASCUS with positive high risk HPV 03/22/2013   Chlamydia 06/22/2013   Dysmenorrhea    resolved   Endometriosis    Vitamin D deficiency 03/22/2013    Past Surgical History:  Procedure Laterality Date   NO PAST  SURGERIES      Family History  Problem Relation Age of Onset   Hypertension Mother    Kidney failure Mother        kidney transplant 2014 (related to pregnancy)   Hypertension Sister    Diabetes Neg Hx    Cancer Neg Hx    Heart disease Neg Hx     Social History   Tobacco Use   Smoking status: Never   Smokeless tobacco: Never  Vaping Use   Vaping Use: Never used  Substance Use Topics   Alcohol use: Not Currently    Alcohol/week: 1.0 standard drink    Types: 1 Glasses of wine per week    Comment: 4-5 oz of wine/week   Drug use: No    Allergies: No Known Allergies  Medications Prior to Admission  Medication Sig Dispense Refill Last Dose   ibuprofen (ADVIL) 800 MG tablet Take 800 mg by mouth every 8 (eight) hours as needed.      albuterol (VENTOLIN HFA) 108 (90 Base) MCG/ACT inhaler Inhale 2 puffs into the lungs every 4 (four) hours as needed for wheezing or shortness of breath. 18 g 0 More than a month   clotrimazole (GYNE-LOTRIMIN) 1 % vaginal cream Place 1 Applicatorful vaginally at bedtime. 30  g 2    metroNIDAZOLE (FLAGYL) 500 MG tablet Take 1 tablet (500 mg total) by mouth 2 (two) times daily. 14 tablet 0    naproxen (NAPROSYN) 500 MG tablet Take 1 tablet (500 mg total) by mouth 2 (two) times daily. 30 tablet 0    valACYclovir (VALTREX) 500 MG tablet Take 1 tablet (500 mg total) by mouth daily. Can increase to twice a day for 5 days in the event of a recurrence 30 tablet 3     Review of Systems  Constitutional:  Negative for chills, diaphoresis, fatigue and fever.  Eyes:  Negative for visual disturbance.  Respiratory:  Negative for shortness of breath.   Cardiovascular:  Negative for chest pain.  Gastrointestinal:  Positive for abdominal pain. Negative for constipation, diarrhea, nausea and vomiting.  Genitourinary:  Positive for vaginal bleeding. Negative for dysuria, flank pain, frequency, pelvic pain, urgency and vaginal discharge.  Neurological:  Negative for  dizziness, weakness, light-headedness and headaches.   Physical Exam   Blood pressure 120/72, pulse 93, temperature 98.2 F (36.8 C), resp. rate 18, last menstrual period 09/28/2020.  Patient Vitals for the past 24 hrs:  BP Temp Pulse Resp  11/25/20 1242 120/72 -- 93 --  11/25/20 1226 121/86 98.2 F (36.8 C) (!) 106 18   Physical Exam Vitals and nursing note reviewed. Exam conducted with a chaperone present.  Constitutional:      General: She is in acute distress.     Appearance: Normal appearance. She is not ill-appearing, toxic-appearing or diaphoretic.  HENT:     Head: Normocephalic and atraumatic.  Pulmonary:     Effort: Pulmonary effort is normal.  Abdominal:     Palpations: Abdomen is soft.  Genitourinary:    General: Normal vulva.     Labia:        Right: No rash, tenderness or lesion.        Left: No rash, tenderness or lesion.      Vagina: Bleeding present.     Cervix: Dilated. Cervical bleeding present.     Comments: Cervical os dilated and POCs visible at os but not yet protruding, unable to be removed with ring forceps. Moderate amount of blood present but minimal active bleeding noted at os. Neurological:     Mental Status: She is alert and oriented to person, place, and time.  Psychiatric:        Mood and Affect: Mood normal.        Behavior: Behavior normal.        Thought Content: Thought content normal.        Judgment: Judgment normal.   Orders Placed This Encounter  Procedures   Resp Panel by RT-PCR (Flu A&B, Covid) Nasopharyngeal Swab    Standing Status:   Standing    Number of Occurrences:   1   US OB Transvaginal    Standing Status:   Standing    Number of Occurrences:   1    Order Specific Question:   Symptom/Reason for Exam    Answer:   Vaginal bleeding in pregnancy [id number]   CBC    Standing Status:   Standing    Number of Occurrences:   1   Comprehensive metabolic panel    Standing Status:   Standing    Number of Occurrences:   1    hCG, quantitative, pregnancy    Standing Status:   Standing    Number of Occurrences:   1   Type and screen [facility]  [place]     Standing Status:   Standing    Number of Occurrences:   1   Meds ordered this encounter  Medications   HYDROmorphone (DILAUDID) injection 1 mg   lactated ringers bolus 1,000 mL   ketorolac (TORADOL) 30 MG/ML injection 30 mg   lactated ringers infusion   MAU Course  Procedures  MDM -pt arrives via EMS after syncopal episode and passing large clots at home -POCs visible at os during exam with minimal active bleeding -1mg  Dilaudid given via IV access established in MAU -pt reports PUL at ED on Sunday -CBC: H/H 8.1/25.6 -CMP: WNL -[place]: PUL -hCG: 587 (3,038 4 days ago) -ABO: B Positive -consulted with Dr. Nelda Marseille who believes patient is a better candidate for D&C/MVA than cytotec given current hhgb status -called and spoke with Dr. Mancel Bale who agrees with plan -pt states she last had a rice cake at 830AM and last had something to drink at 6AM today -pt also reporting migraine HA at this time, rating 8/10, confirmed with Dr. Mancel Bale OK to order toradol for patient, order entered -care turned over to Dr. Mancel Bale -prepare for OR  Orders Placed This Encounter  Procedures   Resp Panel by RT-PCR (Flu A&B, Covid) Nasopharyngeal Swab    Standing Status:   Standing    Number of Occurrences:   1   US OB Transvaginal    Standing Status:   Standing    Number of Occurrences:   1    Order Specific Question:   Symptom/Reason for Exam    Answer:   Vaginal bleeding in pregnancy [id number]   CBC    Standing Status:   Standing    Number of Occurrences:   1   Comprehensive metabolic panel    Standing Status:   Standing    Number of Occurrences:   1   hCG, quantitative, pregnancy    Standing Status:   Standing    Number of Occurrences:   1   Type and screen [facility] [place]      Standing Status:   Standing    Number of Occurrences:   1   Meds ordered this encounter  Medications   HYDROmorphone (DILAUDID) injection 1 mg   lactated ringers bolus 1,000 mL   ketorolac (TORADOL) 30 MG/ML injection 30 mg   lactated ringers infusion   Assessment and Plan   1. Miscarriage   2. Vaginal bleeding in pregnancy   3. Blood type, Rh positive    -prepare for OR  Gerrie Nordmann Ludene Stokke 11/25/2020, 3:18 PM

## 2020-11-25 NOTE — Transfer of Care (Signed)
Immediate Anesthesia Transfer of Care Note  Patient: Caitlin Duncan  Procedure(s) Performed: SUCTION DILATATION AND CURETTAGE (Vagina )  Patient Location: PACU  Anesthesia Type:General  Level of Consciousness: awake, alert , drowsy and patient cooperative  Airway & Oxygen Therapy: Patient Spontanous Breathing  Post-op Assessment: Report given to RN, Post -op Vital signs reviewed and stable and Patient moving all extremities  Post vital signs: Reviewed and stable  Last Vitals:  Vitals Value Taken Time  BP    Temp    Pulse 105 11/25/20 1710  Resp 22 11/25/20 1710  SpO2 100 % 11/25/20 1710  Vitals shown include unvalidated device data.  Last Pain:  Vitals:   11/25/20 1547  TempSrc: Oral  PainSc:          Complications: No notable events documented.

## 2020-11-26 ENCOUNTER — Encounter (HOSPITAL_COMMUNITY): Payer: Self-pay | Admitting: Obstetrics and Gynecology

## 2020-11-28 ENCOUNTER — Encounter (HOSPITAL_COMMUNITY): Payer: Self-pay | Admitting: Obstetrics and Gynecology

## 2020-11-28 ENCOUNTER — Observation Stay (HOSPITAL_COMMUNITY)
Admission: AD | Admit: 2020-11-28 | Discharge: 2020-11-29 | Disposition: A | Discharge status: Home / Self Care | Payer: No Typology Code available for payment source | Attending: Obstetrics & Gynecology | Admitting: Obstetrics & Gynecology

## 2020-11-28 ENCOUNTER — Other Ambulatory Visit: Payer: Self-pay

## 2020-11-28 DIAGNOSIS — O034 Incomplete spontaneous abortion without complication: Secondary | ICD-10-CM | POA: Diagnosis not present

## 2020-11-28 DIAGNOSIS — D649 Anemia, unspecified: Secondary | ICD-10-CM | POA: Diagnosis present

## 2020-11-28 LAB — CBC
HCT: 20.5 % — ABNORMAL LOW (ref 36.0–46.0)
Hemoglobin: 6.3 g/dL — CL (ref 12.0–15.0)
MCH: 27.5 pg (ref 26.0–34.0)
MCHC: 30.7 g/dL (ref 30.0–36.0)
MCV: 89.5 fL (ref 80.0–100.0)
Platelets: 331 10*3/uL (ref 150–400)
RBC: 2.29 MIL/uL — ABNORMAL LOW (ref 3.87–5.11)
RDW: 13.3 % (ref 11.5–15.5)
WBC: 10.4 10*3/uL (ref 4.0–10.5)
nRBC: 0.9 % — ABNORMAL HIGH (ref 0.0–0.2)

## 2020-11-28 LAB — PREPARE RBC (CROSSMATCH)

## 2020-11-28 MED ORDER — ZOLPIDEM TARTRATE 5 MG PO TABS: 5.0000 mg | ORAL_TABLET | Freq: Every evening | ORAL | Status: DC | PRN

## 2020-11-28 MED ORDER — BUTALBITAL-APAP-CAFFEINE 50-325-40 MG PO TABS: 1.0000 | ORAL_TABLET | Freq: Four times a day (QID) | ORAL | Status: DC | PRN

## 2020-11-28 MED ORDER — SODIUM CHLORIDE 0.9% IV SOLUTION: Freq: Once | INTRAVENOUS | Status: AC

## 2020-11-28 MED ORDER — BUTALBITAL-APAP-CAFFEINE 50-325-40 MG PO TABS
1.0000 | ORAL_TABLET | Freq: Four times a day (QID) | ORAL | Status: DC | PRN
Start: 2020-11-28 — End: 2020-11-29
  Administered 2020-11-28 – 2020-11-29 (×2): 2 via ORAL
  Filled 2020-11-28 (×2): qty 2

## 2020-11-29 DIAGNOSIS — D649 Anemia, unspecified: Secondary | ICD-10-CM | POA: Diagnosis not present

## 2020-11-29 LAB — CBC
HCT: 25.6 % — ABNORMAL LOW (ref 36.0–46.0)
Hemoglobin: 8.5 g/dL — ABNORMAL LOW (ref 12.0–15.0)
MCH: 28.4 pg (ref 26.0–34.0)
MCHC: 33.2 g/dL (ref 30.0–36.0)
MCV: 85.6 fL (ref 80.0–100.0)
Platelets: 306 10*3/uL (ref 150–400)
RBC: 2.99 MIL/uL — ABNORMAL LOW (ref 3.87–5.11)
RDW: 14.9 % (ref 11.5–15.5)
WBC: 10.3 10*3/uL (ref 4.0–10.5)
nRBC: 1 % — ABNORMAL HIGH (ref 0.0–0.2)

## 2020-11-29 LAB — BPAM RBC
Blood Product Expiration Date: 202211202359
Blood Product Expiration Date: 202211202359
ISSUE DATE / TIME: 202211072018
ISSUE DATE / TIME: 202211072338
Unit Type and Rh: 7300
Unit Type and Rh: 7300

## 2020-11-29 LAB — TYPE AND SCREEN
ABO/RH(D): B POS
Antibody Screen: NEGATIVE
Unit division: 0
Unit division: 0

## 2020-11-29 MED ORDER — DOXYCYCLINE MONOHYDRATE 100 MG PO CAPS: 100.0000 mg | ORAL_CAPSULE | Freq: Two times a day (BID) | ORAL | 0 refills | Status: AC

## 2020-11-29 MED ORDER — DOCUSATE SODIUM 100 MG PO CAPS: 100.0000 mg | ORAL_CAPSULE | Freq: Two times a day (BID) | ORAL | 3 refills | Status: DC | PRN

## 2020-11-29 MED ORDER — IRON (FERROUS SULFATE) 325 (65 FE) MG PO TABS: ORAL_TABLET | ORAL | 3 refills | Status: AC

## 2020-11-29 MED ORDER — SUMATRIPTAN SUCCINATE 25 MG PO TABS: 25.0000 mg | ORAL_TABLET | ORAL | 0 refills | Status: DC | PRN

## 2020-11-29 MED ORDER — OXYCODONE HCL 5 MG PO TABS
5.0000 mg | ORAL_TABLET | ORAL | Status: DC | PRN
  Administered 2020-11-29 (×2): 5 mg via ORAL
  Filled 2020-11-29 (×2): qty 1

## 2020-11-29 NOTE — Progress Notes (Signed)
MD Progress Note  HD#1 admitted for symptomatic anemia and blood transfusion s/p hemorrhage from incomplete ab.   Subjective: Patient reports feeling better, she still has a mild headache. She denies chest pain, shortness of breath. Patient notes that before her D&E on 11/4 she had very heavy bleeding at home, passing clots in the bathtub. She notes the bright red bleeding was "coming out like urine".  She was not aware that she was pregnant. She also noted a fever to 102. She presently denies subjective fever or chills, but she is having some pelvic cramping.   Objective: I have reviewed patient's vital signs and labs.  General: alert, cooperative, and no distress GI: soft, non-tender;  Extremities: Homans sign is negative, no sign of DVT and no edema, redness or tenderness in the calves or thighs Vaginal Bleeding: none   Assessment/Plan: 30 year old HD#1 for symptomatic anemia s/p transfusion of 2 units packed red blood cells. POD#4 s/p D&E for retained POC / incomplete ab.  Patient with hemoglobin now of 8.5.  She was counseled that she may still feel weak and have symptoms of anemia. She was advised to take iron as well as eat iron rich foods.  Due to her history of fever at home will prescribe doxycycline x 7 days to ensure she does not get endometritis.  Patient will be discharged home with close follow up in the office.   LOS: 1 day    [facility] 11/29/2020, 11:35 AM

## 2020-11-30 LAB — SURGICAL PATHOLOGY

## 2020-11-30 NOTE — H&P (Signed)
Caitlin Duncan y.o. female. Who presented to the office with migraines and dizziness.  She is S/p D&E for incomplete abortion three days ago.  NO SOB.  HGB was 8.5   . Blood transfusions: no Sexually transmitted diseases: H/o chlamydia Previous GYN Procedures: D&E Last mammogram:  Last pap: normal Date: WNL OB History: G3P1   Menstrual History: Menarche age: 30 Patient's last menstrual period was 09/28/2020 (exact date).    Past Medical History:  Diagnosis Date   Anemia    ASCUS with positive high risk HPV 03/22/2013   Chlamydia 06/22/2013   Dysmenorrhea    resolved   Endometriosis    Vitamin D deficiency 03/22/2013   Past Surgical History:  Procedure Laterality Date   DILATION AND CURETTAGE OF UTERUS N/A 11/25/2020   Procedure: SUCTION DILATATION AND CURETTAGE;  Surgeon: Osborn Coho, MD;  Location: [facility] OR;  Service: Gynecology;  Laterality: N/A;   NO PAST SURGERIES     No current facility-administered medications for this encounter.  Current Outpatient Medications:    docusate sodium (COLACE) 100 MG capsule, Take 1 capsule (100 mg total) by mouth 2 (two) times daily as needed for mild constipation., Disp: 30 capsule, Rfl: 3   doxycycline (MONODOX) 100 MG capsule, Take 1 capsule (100 mg total) by mouth 2 (two) times daily for 7 days., Disp: 14 capsule, Rfl: 0   ibuprofen (ADVIL) 800 MG tablet, Take 1 tablet (800 mg total) by mouth every 8 (eight) hours as needed., Disp: 30 tablet, Rfl: 0   Iron, Ferrous Sulfate, 325 (65 Fe) MG TABS, 1 tablet twice a day. Take with vitamin C source, Disp: 30 tablet, Rfl: 3   oxyCODONE-acetaminophen (PERCOCET) 5-325 MG tablet, Take 1 tablet by mouth every 6 (six) hours as needed for severe pain., Disp: 6 tablet, Rfl: 0   SUMAtriptan (IMITREX) 25 MG tablet, Take 1 tablet (25 mg total) by mouth every 2 (two) hours as needed for migraine. May repeat in 2 hours if headache persists or recurs., Disp: 10 tablet, Rfl: 0   albuterol (VENTOLIN  HFA) 108 (90 Base) MCG/ACT inhaler, Inhale 2 puffs into the lungs every 4 (four) hours as needed for wheezing or shortness of breath., Disp: 18 g, Rfl: 0   valACYclovir (VALTREX) 500 MG tablet, Take 1 tablet (500 mg total) by mouth daily. Can increase to twice a day for 5 days in the event of a recurrence, Disp: 30 tablet, Rfl: 3 No Known Allergies Review of Systems - Negative except see above   Physical Exam  BP 131/75 (BP Location: Left Arm)   Pulse 79   Temp 98 F (36.7 C) (Oral)   Resp 18   Ht 5\' 6"  (1.676 m)   Wt 110.2 kg   LMP 09/28/2020 (Exact Date) Comment: D&C friday  SpO2 100%   Breastfeeding Unknown   BMI 39.22 kg/m  Constitutional: She appears well-developed and well-nourished.  HENT:  Head: Normocephalic.  Eyes: Pupils are equal, round, and reactive to light.  Neck: Normal range of motion. Neck supple.  Cardiovascular: Regular rhythm.   Respiratory: Effort normal and breath sounds normal.  GI: Soft.  Genitourinary:v/v WNL.  Cs is closed with minimal bleeding.  Normal size NT uterus.   Musculoskeletal: Normal range of motion.  Neurological: She is alert.  Skin: Skin is warm.  Psychiatric: She has a normal mood and affect.  Results for orders placed or performed during the hospital encounter of 11/28/20 (from the past 72 hour(s))  Prepare RBC (crossmatch)  Status: None   Collection Time: 11/28/20  6:30 PM  Result Value Ref Range   Order Confirmation      ORDER PROCESSED BY BLOOD BANK Performed at [facility], [address] [place], [place] [address]   Type and screen [facility]     Status: None   Collection Time: 11/28/20  6:43 PM  Result Value Ref Range   ABO/RH(D) B POS    Antibody Screen NEG    Sample Expiration 12/01/2020,2359    Unit Number [id number]    Blood Component Type RED CELLS,LR    Unit division 00    Status of Unit ISSUED,FINAL    Transfusion Status OK TO TRANSFUSE    Crossmatch Result       Compatible Performed at [facility], [address] [place], [place] [address]    Unit Number [id number]    Blood Component Type RED CELLS,LR    Unit division 00    Status of Unit ISSUED,FINAL    Transfusion Status OK TO TRANSFUSE    Crossmatch Result Compatible   CBC     Status: Abnormal   Collection Time: 11/28/20  6:47 PM  Result Value Ref Range   WBC 10.4 4.0 - 10.5 K/uL   RBC 2.29 (L) 3.87 - 5.11 MIL/uL   Hemoglobin 6.3 (LL) 12.0 - 15.0 g/dL    Comment: REPEATED TO VERIFY THIS CRITICAL RESULT HAS VERIFIED AND BEEN CALLED TO S MORRIS RN BY KYUNG BAEK ON 11 07 2022 AT 1932, AND HAS BEEN READ BACK.     HCT 20.5 (L) 36.0 - 46.0 %   MCV 89.5 80.0 - 100.0 fL   MCH 27.5 26.0 - 34.0 pg   MCHC 30.7 30.0 - 36.0 g/dL   RDW 96.7 89.3 - 81.0 %   Platelets 331 150 - 400 K/uL   nRBC 0.9 (H) 0.0 - 0.2 %    Comment: Performed at [facility], [address], [place], [place] [address]  CBC     Status: Abnormal   Collection Time: 11/29/20  6:40 AM  Result Value Ref Range   WBC 10.3 4.0 - 10.5 K/uL   RBC 2.99 (L) 3.87 - 5.11 MIL/uL   Hemoglobin 8.5 (L) 12.0 - 15.0 g/dL    Comment: REPEATED TO VERIFY POST TRANSFUSION SPECIMEN    HCT 25.6 (L) 36.0 - 46.0 %   MCV 85.6 80.0 - 100.0 fL   MCH 28.4 26.0 - 34.0 pg   MCHC 33.2 30.0 - 36.0 g/dL   RDW 25.8 52.7 - 78.2 %   Platelets 306 150 - 400 K/uL   nRBC 1.0 (H) 0.0 - 0.2 %    Comment: Performed at [facility], [address] [place], [place] [address]     Assessment/Plan: SX Anemia  Migraines Place pt in OBS Give 2 units of blood Fioricet for migraines Pt agrees with the plan of care    Jarmal Lewelling A 12/11/2010, 11:41 AM

## 2020-12-01 NOTE — Discharge Summary (Signed)
Physician Discharge Summary  Patient ID: Caitlin Duncan MRN: [id number] DOB/AGE: [birth year] 30 y.o.  Admit date: 11/28/2020 Discharge date: 12/01/2020  Admission Diagnoses: Symptomatic anemia from hemorrhage   Discharge Diagnoses:  Active Problems:   Anemia   Discharged Condition: good  Hospital Course: Patient admitted overnight to evaluate hemoglobin and for blood transfusion.  Patient initially with weakness, light-headedness and malaise. Hemoglobin was determined to be 6.3 and  She was transfused two units of packed red blood cells.  Hemoglobin improved and patient clinically improved.  She was discharged the following day.   Consults: None  Significant Diagnostic Studies: labs: CBC  Treatments: IV hydration and transfusion 2 units packed red blood cells  Discharge Exam: Blood pressure 131/75, pulse 79, temperature 98 F (36.7 C), temperature source Oral, resp. rate 18, height 5\' 6"  (1.676 m), weight 110.2 kg, last menstrual period 09/28/2020, SpO2 100 %, unknown if currently breastfeeding. General appearance: alert, cooperative, and no distress GI: soft, non-tender; bowel sounds normal; no masses,  no organomegaly Pelvic: deferred and no active bleeding Extremities: extremities normal, atraumatic, no cyanosis or edema, Homans sign is negative, no sign of DVT, and no edema, redness or tenderness in the calves or thighs  Disposition: Discharge disposition: 01-Home or Self Care       Discharge Instructions     Call MD for:   Complete by: As directed    Abnormal foul smelling vaginal discharge or heavy vaginal bleeding (soaking a pad an hour)   Call MD for:  difficulty breathing, headache or visual disturbances   Complete by: As directed    Call MD for:  extreme fatigue   Complete by: As directed    Call MD for:  hives   Complete by: As directed    Call MD for:  persistant dizziness or light-headedness   Complete by: As directed    Call MD for:  persistant  nausea and vomiting   Complete by: As directed    Call MD for:  redness, tenderness, or signs of infection (pain, swelling, redness, odor or green/yellow discharge around incision site)   Complete by: As directed    Call MD for:  severe uncontrolled pain   Complete by: As directed    Call MD for:  temperature >100.4   Complete by: As directed    Diet - low sodium heart healthy   Complete by: As directed    Discharge instructions   Complete by: As directed    Call [facility] office if you have any questions or concerns ([address].  Dr. ) [phone] mobile [phone]   Increase activity slowly   Complete by: As directed    If you feel light headed or dizzy, ask for assistance and don't drive      Allergies as of 11/29/2020   No Known Allergies      Medication List     TAKE these medications    albuterol 108 (90 Base) MCG/ACT inhaler Commonly known as: VENTOLIN HFA Inhale 2 puffs into the lungs every 4 (four) hours as needed for wheezing or shortness of breath.   docusate sodium 100 MG capsule Commonly known as: Colace Take 1 capsule (100 mg total) by mouth 2 (two) times daily as needed for mild constipation.   doxycycline 100 MG capsule Commonly known as: MONODOX Take 1 capsule (100 mg total) by mouth 2 (two) times daily for 7 days.   ibuprofen 800 MG tablet Commonly known as: ADVIL Take 1 tablet (800 mg total)  by mouth every 8 (eight) hours as needed.   Iron (Ferrous Sulfate) 325 (65 Fe) MG Tabs 1 tablet twice a day. Take with vitamin C source   oxyCODONE-acetaminophen 5-325 MG tablet Commonly known as: Percocet Take 1 tablet by mouth every 6 (six) hours as needed for severe pain.   SUMAtriptan 25 MG tablet Commonly known as: IMITREX Take 1 tablet (25 mg total) by mouth every 2 (two) hours as needed for migraine. May repeat in 2 hours if headache persists or recurs.   valACYclovir 500 MG tablet Commonly known as: VALTREX Take 1 tablet (500 mg total) by  mouth daily. Can increase to twice a day for 5 days in the event of a recurrence         Signed: Essie Hart 12/01/2020, 2:37 PM

## 2020-12-27 NOTE — Op Note (Signed)
Preop Diagnosis: Incomplete Ab  Postop Diagnosis: Incomplete Ab  Procedure: Suction Dilation and Curettage  Anesthesia: see anesthesia record  Attending: Everett Graff   Assistant: N/a  Findings: POCs  Pathology: POCs  Fluids: See flowsheet  UOP: See flowsheet  EBL: See flowsheet  Complications: None  Procedure: The patient was taken to the operating room after the risks benefits and alternatives were discussed with the patient, the patient verbalized understanding and consent signed and witnessed.  The patient was placed under MAC anesthesia, prepped and draped in the normal sterile fashion and a time out was performed.  A bivalve speculum was placed in the patient's vagina and the anterior lip of the cervix grasped with a single-tooth tenaculum. A paracervical block was administered using a total of 10 cc of 2% lidocaine.  The uterus was sounded to 8 cm and a size 8 suction curette was used. Suction curettage was performed until minimal tissue returned. Sharp curettage was performed until a gritty texture was noted. Suction curettage was performed once again to remove any remaining debris. All instruments were removed. The count was correct. The patient was transferred to the recovery room in good condition.

## 2021-05-15 ENCOUNTER — Telehealth: Payer: Self-pay

## 2021-05-15 NOTE — Telephone Encounter (Signed)
Attempted to contact about BC rx, no answer, left vm ?

## 2021-05-16 ENCOUNTER — Telehealth: Payer: Self-pay

## 2021-05-16 NOTE — Telephone Encounter (Signed)
Patient returned call, advised that we cannot refill BC because it was prescribed by another GYN practice. Advised that we can sent a new rx after annual appt. ?

## 2021-05-18 ENCOUNTER — Other Ambulatory Visit (HOSPITAL_COMMUNITY)
Admission: RE | Admit: 2021-05-18 | Discharge: 2021-05-18 | Disposition: A | Discharge status: Home / Self Care | Payer: 59 | Source: Ambulatory Visit | Attending: Obstetrics and Gynecology | Admitting: Obstetrics and Gynecology

## 2021-05-18 ENCOUNTER — Ambulatory Visit (INDEPENDENT_AMBULATORY_CARE_PROVIDER_SITE_OTHER): Payer: No Typology Code available for payment source | Admitting: Emergency Medicine

## 2021-05-18 VITALS — BP 132/90 | HR 74 | Ht 66.0 in | Wt 243.0 lb

## 2021-05-18 DIAGNOSIS — N898 Other specified noninflammatory disorders of vagina: Secondary | ICD-10-CM | POA: Diagnosis present

## 2021-05-18 NOTE — Progress Notes (Signed)
SUBJECTIVE:  ?31 y.o. female complains of white vaginal discharge for 2 week(s). ?Denies abnormal vaginal bleeding or significant pelvic pain or ?fever. No UTI symptoms. Denies history of known exposure to STD. ? ?LMP 05/10/21 ? ?OBJECTIVE:  ?She appears well, afebrile. ?Urine dipstick: not done. ? ?ASSESSMENT:  ?Vaginal Discharge - white chunky. ?Vaginal Odor-none reported ? ? ?PLAN:  ?GC, chlamydia, trichomonas, BVAG, CVAG probe sent to lab. ?Treatment: To be determined once lab results are received ?ROV prn if symptoms persist or worsen.  ?

## 2021-05-19 LAB — CERVICOVAGINAL ANCILLARY ONLY
Bacterial Vaginitis (gardnerella): POSITIVE — AB
Candida Glabrata: NEGATIVE
Candida Vaginitis: NEGATIVE
Chlamydia: NEGATIVE
Comment: NEGATIVE
Comment: NEGATIVE
Comment: NEGATIVE
Comment: NEGATIVE
Comment: NEGATIVE
Comment: NORMAL
Neisseria Gonorrhea: NEGATIVE
Trichomonas: NEGATIVE

## 2021-05-19 MED ORDER — METRONIDAZOLE 500 MG PO TABS: 500.0000 mg | ORAL_TABLET | Freq: Two times a day (BID) | ORAL | 0 refills | Status: DC

## 2021-05-19 NOTE — Addendum Note (Signed)
Addended by: Catalina Antigua on: 05/19/2021 02:18 PM ? ? Modules accepted: Orders ? ?

## 2021-06-12 ENCOUNTER — Ambulatory Visit: Payer: 59

## 2021-06-12 ENCOUNTER — Telehealth: Payer: Self-pay

## 2021-06-12 DIAGNOSIS — B3731 Acute candidiasis of vulva and vagina: Secondary | ICD-10-CM

## 2021-06-12 MED ORDER — FLUCONAZOLE 150 MG PO TABS: 150.0000 mg | ORAL_TABLET | Freq: Once | ORAL | 0 refills | Status: AC

## 2021-06-12 NOTE — Telephone Encounter (Signed)
Pt reports vaginal itching and reports possible yeast infection after recent antibiotic use.  Pt requests Diflucan sent to the pharmacy.  Pt agrees to contact the office if sx's worsens or do not improve.

## 2021-07-03 ENCOUNTER — Ambulatory Visit: Payer: No Typology Code available for payment source | Admitting: Obstetrics and Gynecology

## 2021-07-07 ENCOUNTER — Other Ambulatory Visit: Payer: Self-pay | Admitting: *Deleted

## 2021-07-07 DIAGNOSIS — N898 Other specified noninflammatory disorders of vagina: Secondary | ICD-10-CM

## 2021-07-07 MED ORDER — FLUCONAZOLE 150 MG PO TABS: 150.0000 mg | ORAL_TABLET | Freq: Once | ORAL | 0 refills | Status: AC

## 2021-07-07 NOTE — Progress Notes (Signed)
Pt reports symptoms of vaginal yeast infection. RX Diflucan. Reports external irritation. Advised application of OTC external yeast medication several times a day. Pt advised to follow up in office for unresolved symptoms.

## 2021-08-08 ENCOUNTER — Other Ambulatory Visit (HOSPITAL_COMMUNITY)
Admission: RE | Admit: 2021-08-08 | Discharge: 2021-08-08 | Disposition: A | Discharge status: Home / Self Care | Payer: 59 | Source: Ambulatory Visit | Attending: Obstetrics and Gynecology | Admitting: Obstetrics and Gynecology

## 2021-08-08 ENCOUNTER — Encounter: Payer: Self-pay | Admitting: Obstetrics and Gynecology

## 2021-08-08 ENCOUNTER — Ambulatory Visit (INDEPENDENT_AMBULATORY_CARE_PROVIDER_SITE_OTHER): Payer: 59 | Admitting: Obstetrics and Gynecology

## 2021-08-08 VITALS — BP 136/93 | HR 74 | Ht 66.0 in | Wt 266.9 lb

## 2021-08-08 DIAGNOSIS — Z01419 Encounter for gynecological examination (general) (routine) without abnormal findings: Secondary | ICD-10-CM | POA: Insufficient documentation

## 2021-08-08 MED ORDER — CAMILA 0.35 MG PO TABS: 1.0000 | ORAL_TABLET | Freq: Every day | ORAL | 3 refills | Status: DC

## 2021-08-08 NOTE — Patient Instructions (Signed)
AREA FAMILY PRACTICE PHYSICIANS  Central/[place]outheast [place] ([address]) [facility] [address] [place], [place] [address] [phone] Mon-Fri 8:30-12:30, 1:30-5:00 Accepting Medicaid [facility] [address], [place], [place] [address] [phone] Mon-Fri 8:00-5:30 [facility] [address] [place], [place] [address] [phone] Mon, Tue, Thur, Fri 8:30-5:00, Wed 10:00-7:00 (closed 1-2pm) Accepting Medicaid [facility] [address], [place], [place]  [address] Phone - [phone]   Fax - [phone]  [place]ortheast [place] ([address]) [facility] [address] [place], [place] [address] [phone] Mon-Fri 8:00-5:00 [facility] [place] ([facility])  [address] [place], [place] [address] [phone] Mon-Fri 8:30-5:30, Sat (Oct.-Mar.) 9:00-1:00 Accepting Medicaid  [place] ([address]) [facility] [address], [place], [place] [address] [phone] Mon-Fri 8:00-5:00  [place] ([address]) [facility] [employer] [address], [place], [place] [address] [phone] Mon-Fri 8:00-5:00 [facility] [address], [place], [place] [address] [phone] Mon-Fri 8:00-5:00 [facility] [place] [address] [place], [place] [address] [phone] Mon-Fri 8:00-5:00 [facility] [address] [place] [address] [phone] Mon-Fri 7:30-5:30  [place] ([address] & [address]) [facility] [address] [place], [place] [address] [phone] Mon-Thur 8:00-6:00 Accepting Medicaid [facility] [address] [place], [place] [address] [phone] Mon-Thur 7:30-7:30, Fri 7:30-4:30 Accepting  Medicaid [facility] [place] [address], [place], [place]  [address] [phone]   Fax - [phone]  [place] ([address] & [address]) [facility] [place] [address] [place], [place] [address] [phone] Mon-Fri 7:00-5:00 [facility] [address], [place], [place] [address] [phone] Mon-Fri 8:00-5:00 Accepting Medicaid [facility] [place] [facility] [address], [place], [place] [address] [phone] Mon-Fri 8:00-5:00 Accepting Medicaid  [place] ([address]) [facility] [place] [address] [place], [place] [address] [phone] Mon-Fri 8:00-5:00 [facility] [address], [place], [place] [address] [phone] Mon-Fri 8:00-5:00 Accepting Medicaid [facility] ([facility]) [address], [place], [place] [address] [phone] Mon-Fri 8:00-5:30, Sat&Sun by appointment (phones open at 8:30) Accepting Medicaid  [place] ([address]) [facility] [address] [place], [place] [address] [phone] Mon-Thur 8:00-7:00, Fri 8:00-5:00, Sat 8:00-12:00, Sun 9:00-12:00 Accepting Medicaid [facility] [place] [address], [place], [place] [address] [phone] Mon-Thur 8:00-5:00 Accepting Medicaid [facility] [place] [address] [place], [place] [address] [phone] Mon-Fri 8:00-5:30, Sat (Oct.-Mar.) 9:00-1:00 Accepting Medicaid  [place] ([address]) [facility] [address], [place], [place] [address] [phone] Mon-Fri 8:00-5:00 Accepting Medicaid   [place] ([address]) [facility] [place] [address], [place], [place] [address] [phone] Mon-Fri 8:00-5:00 [facility] [place] [address], [place], [place] [address] [phone] Mon-Fri 8:00-5:00 [facility] [place] [address] [place] [address], [place], [place] [address] [phone] Mon-Fri 8:00-5:00 After hours clinic ([address] ,  [address]) [phone] Mon-Fri 5:00-8:00, Sat 12:00-6:00, Sun 10:00-4:00 Accepting Medicaid [facility] [place]   [address], [place], [place]  [address] [phone]   Fax - [phone]  [place] ([address]) [facility] [place] [address], [place], [place] [address] [phone] Mon-Fri 8:00-5:00 [facility] [place] ([facility] [place]) [address], [place], [place] [address] [phone] Mon-Thur 8:00-7:00, Fri 8:00-5:00, Sat 8:00-12:00    

## 2021-08-08 NOTE — Progress Notes (Signed)
Patient presents for annual gyn exam. Reports thin, watery discharge. Had BV last month and thinks she still has it. Would like STD swab and HIV panel. Last PAP November 22. Would like to discuss different BC options. Patient reports worsening headaches and swelling in her feet. BP has been elevated. Would like a referral to a PCP.  PHQ-9 neg GAD 7 neg

## 2021-08-08 NOTE — Progress Notes (Signed)
Subjective:     Caitlin Duncan is a 31 y.o. female P0 with BMI 70 who is here for a comprehensive physical exam. The patient reports no problems. She is sexually active without contraception. She is interested in restarting pill. She reports a history of migraines. She reports a monthly 3-day period. She denies pelvic pain. She reports a watery thin discharge. She is without any complaints  Past Medical History:  Diagnosis Date   Anemia    ASCUS with positive high risk HPV 03/22/2013   Chlamydia 06/22/2013   Dysmenorrhea    resolved   Endometriosis    Vitamin D deficiency 03/22/2013   Past Surgical History:  Procedure Laterality Date   DILATION AND CURETTAGE OF UTERUS N/A 11/25/2020   Procedure: SUCTION DILATATION AND CURETTAGE;  Surgeon: Osborn Coho, MD;  [facility];  Service: Gynecology;  Laterality: N/A;   NO PAST SURGERIES     Family History  Problem Relation Age of Onset   Hypertension Mother    Kidney failure Mother        kidney transplant 2014 (related to pregnancy)   Hypertension Sister    Diabetes Neg Hx    Cancer Neg Hx    Heart disease Neg Hx     Social History   Socioeconomic History   Marital status: Single    Spouse name: Not on file   Number of children: Not on file   Years of education: Not on file   Highest education level: Not on file  Occupational History   Not on file  Tobacco Use   Smoking status: Never   Smokeless tobacco: Never  Vaping Use   Vaping Use: Never used  Substance and Sexual Activity   Alcohol use: Yes    Alcohol/week: 1.0 standard drink of alcohol    Types: 1 Glasses of wine per week    Comment: 4-5 oz of wine/week   Drug use: No   Sexual activity: Yes    Partners: Male    Birth control/protection: Condom  Other Topics Concern   Not on file  Social History Narrative   Lives alone. No pets.   Family lives in [place].   Graduated from [employer] (criminal justice) . Lives alone.  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 May move to [place]. Applied for jobs in [place] (law enforcement)--got delayed due to death in the family, but still planning to do this.   Updated 04/2017   Social Determinants of Health   Financial Resource Strain: Not on file  Food Insecurity: Not on file  Transportation Needs: Not on file  Physical Activity: Not on file  Stress: Not on file  Social Connections: Not on file  Intimate Partner Violence: Not on file   Health Maintenance  Topic Date Due   COVID-19 Vaccine (1) Never done   Hepatitis C Screening  Never done   PAP SMEAR-Modifier  05/08/2020   INFLUENZA VACCINE  08/22/2021   TETANUS/TDAP  04/14/2023   HPV VACCINES  Completed   HIV Screening  Completed       Review of Systems Pertinent items noted in HPI and remainder of comprehensive ROS otherwise negative.   Objective:  Blood pressure (!) 136/93, pulse 74, height 5\' 6"  (1.676 m), weight 266 lb 14.4 oz (121.1 kg), unknown if currently breastfeeding.   GENERAL: Well-developed, well-nourished female in no acute distress.  HEENT: Normocephalic, atraumatic. Sclerae anicteric.  NECK: Supple. Normal thyroid.  LUNGS:  Clear to auscultation bilaterally.  HEART: Regular rate and rhythm. BREASTS: Symmetric in size. No palpable masses or lymphadenopathy, skin changes, or nipple drainage. ABDOMEN: Soft, nontender, nondistended. No organomegaly. PELVIC: Normal external female genitalia. Vagina is pink and rugated.  Normal discharge. Normal appearing cervix. Uterus is normal in size. No adnexal mass or tenderness. Chaperone present during the pelvic exam EXTREMITIES: No cyanosis, clubbing, or edema, 2+ distal pulses.     Assessment:    Healthy female exam.      Plan:    Pap smear collected STI screening per patient request Patient will be contacted with abnormal results Rx POP provided See After Visit Summary for Counseling Recommendations

## 2021-08-09 LAB — CERVICOVAGINAL ANCILLARY ONLY
Bacterial Vaginitis (gardnerella): POSITIVE — AB
Candida Glabrata: NEGATIVE
Candida Vaginitis: NEGATIVE
Chlamydia: NEGATIVE
Comment: NEGATIVE
Comment: NEGATIVE
Comment: NEGATIVE
Comment: NEGATIVE
Comment: NEGATIVE
Comment: NORMAL
Neisseria Gonorrhea: NEGATIVE
Trichomonas: NEGATIVE

## 2021-08-10 LAB — CYTOLOGY - PAP
Comment: NEGATIVE
Diagnosis: NEGATIVE
High risk HPV: NEGATIVE

## 2021-08-10 MED ORDER — METRONIDAZOLE 0.75 % VA GEL: 1.0000 | Freq: Every day | VAGINAL | 1 refills | Status: DC

## 2021-08-10 NOTE — Addendum Note (Signed)
Addended by: Catalina Antigua on: 08/10/2021 07:31 PM   Modules accepted: Orders

## 2021-08-11 ENCOUNTER — Telehealth: Payer: Self-pay | Admitting: Emergency Medicine

## 2021-08-11 NOTE — Telephone Encounter (Signed)
RC to patient. Patient states she has received her medication and has no issues at this time.   Leavy Cella, RN

## 2022-02-07 ENCOUNTER — Other Ambulatory Visit (HOSPITAL_COMMUNITY)
Admission: RE | Admit: 2022-02-07 | Discharge: 2022-02-07 | Disposition: A | Discharge status: Home / Self Care | Payer: 59 | Source: Ambulatory Visit

## 2022-02-07 ENCOUNTER — Ambulatory Visit (INDEPENDENT_AMBULATORY_CARE_PROVIDER_SITE_OTHER): Payer: 59

## 2022-02-07 VITALS — BP 125/89 | HR 69 | Ht 66.0 in | Wt 256.0 lb

## 2022-02-07 DIAGNOSIS — N898 Other specified noninflammatory disorders of vagina: Secondary | ICD-10-CM

## 2022-02-07 DIAGNOSIS — Z3041 Encounter for surveillance of contraceptive pills: Secondary | ICD-10-CM | POA: Diagnosis not present

## 2022-02-07 DIAGNOSIS — L739 Follicular disorder, unspecified: Secondary | ICD-10-CM

## 2022-02-07 DIAGNOSIS — Z8619 Personal history of other infectious and parasitic diseases: Secondary | ICD-10-CM | POA: Diagnosis not present

## 2022-02-07 MED ORDER — VALACYCLOVIR HCL 500 MG PO TABS: 500.0000 mg | ORAL_TABLET | Freq: Every day | ORAL | 3 refills | Status: DC

## 2022-02-07 MED ORDER — NORETHINDRONE 0.35 MG PO TABS: 1.0000 | ORAL_TABLET | Freq: Every day | ORAL | 3 refills | Status: DC

## 2022-02-07 NOTE — Patient Instructions (Addendum)
[facility] [address] [place], [place] [address] [phone] Locate on [place] [phone]) [place] [address] [place], [place] [address] [phone] Mon-Fri 8:30-12:30, 1:30-5:00 Accepting [facility] [place], [place], [place] [address] [phone] Mon-Fri 8:00-5:30 [facility] [address] [place], [place] [address] [phone] Mechele Dawley, Thur, Fri 8:30-5:00, Wed 10:00-7:00 (closed 1-2pm) Accepting [facility] [place]. [address], [place], [place]  [address] Phone - [phone]   Fax - [phone]  East/[place]ortheast [place] [phone]) [facility] [address], [place], [place] [address] [phone] Mon-Fri 8:00-5:00 [facility] at [facility] [place])  [address] Barbara Cower [place], [place] [address] [phone] Mon-Fri 8:30-5:30, Sat (Oct.-Mar.) 9:00-1:00 Accepting Medicaid  [place] [phone]) [place] at [place], [place], [place] [address] [phone] Mon-Fri 8:00-5:00  [place] [phone]) [place] at [facility] [address] [place], [place] [address] [phone] Mon-Fri 8:00-5:00 [place] [facility] [place], [place], [place] [address] [phone] Mon-Fri 8:00-5:00 [place] [facility] [address] Madelaine Bhat [place], [place] [address] [phone] Mon-Fri 8:00-5:00 [place]., [place] [address] [phone] Mon-Fri 7:30-5:30  [place] [phone] & [phone]) [facility] [address] [place], [place] [address] [phone] Mon-Thur 8:00-6:00 Accepting Medicaid [place] [address] Madelaine Bhat [place], [place] [address] [phone] Mon-Thur 7:30-7:30, Fri 7:30-4:30 Accepting [facility] at [place]. [address] [place], [place]  [address] [phone]   Fax - [place] [phone] & [phone]) [place] at [address] [place], [place] [address] [phone] Mon-Fri 7:00-5:00 [place] [address], [place], [place] [address] [phone] Mon-Fri 8:00-5:00 Accepting Medicaid [place] [address] [place], [place], [place] [address] [phone] Mon-Fri 8:00-5:00 Accepting Medicaid  [place] [phone]) [facility] [address] Madelaine Bhat [place], [place] [address] [phone] Mon-Fri 8:00-5:00 [place] ([place] at [facility]) [address], [place], [place] [address] [phone] Mon-Fri 8:00-5:00 Accepting Medicaid [place] ([facility] [employer]) [address] [place], [place], [place] [address] [phone] Mon-Fri 8:00-5:30, Sat&Sun by appointment (phones open at 8:30) Accepting [facility] [phone] & [phone]) [place] [address], [place], [place] [address] [phone] Mon-Thur 8:00-7:00, Fri 8:00-5:00, Sat 8:00-12:00, Sun 9:00-12:00 Accepting Medicaid [facility] at [facility] [address] [place], [place], [place] [address] [phone] Mon-Thur 8:00-5:00 Accepting Medicaid [facility] at [place]., [place], [place] [address] [phone] Mon-Fri 8:00-5:30, Sat (Oct.-Mar.) 9:00-1:00 Accepting [facility]  [place] [phone]) [place] [address] [place], [place], [place] [address] [phone] Mon-Fri 8:00-5:00 Accepting [facility]   [place] [phone]) [place] at [facility] [address], [place], [place] [address] [phone] Mon-Fri 8:00-5:00 [place] at [facility] [address] Marolyn Hammock [place], [place] [address] [phone] Mon-Fri 8:00-5:00 [place] [address], [place], [place] [address] [phone] Mon-Fri 8:00-5:00 After hours clinic ([address] [place], [place] [address]) [phone] Mon-Fri 5:00-8:00, Sat 12:00-6:00, Sun 10:00-4:00 Accepting Medicaid [place] at [facility]. [address], [place], [place]  [address] [phone]   Fax - [phone]  [place] [phone]) [place] at [facility] [address] [place], [place], [place] [address] [phone] Mon-Fri 8:00-5:00 [place] ([place] [facility] [place]) [address] [place], [place], [place] [address] [phone] Mon-Thur 8:00-7:00, Fri 8:00-5:00, Sat 8:00-12:00

## 2022-02-07 NOTE — Progress Notes (Signed)
Patient reports having a bump on her right labia. She believes it popped yesterday, but is still having some discomfort. Pt also believes she may have BV again and would like to be checked. Requesting refill for her Valtrex and birth control pill. No other concerns at this time.

## 2022-02-08 LAB — CERVICOVAGINAL ANCILLARY ONLY
Bacterial Vaginitis (gardnerella): NEGATIVE
Candida Glabrata: NEGATIVE
Candida Vaginitis: NEGATIVE
Chlamydia: NEGATIVE
Comment: NEGATIVE
Comment: NEGATIVE
Comment: NEGATIVE
Comment: NEGATIVE
Comment: NEGATIVE
Comment: NORMAL
Neisseria Gonorrhea: NEGATIVE
Trichomonas: NEGATIVE

## 2022-02-10 NOTE — Progress Notes (Signed)
   GYNECOLOGY PROBLEM OFFICE VISIT NOTE  History:  Caitlin Duncan is a 32 y.o. G2P0020 here today for vaginal itching and irritation.  She states she recently completed treatment for BV, but feels like things are still irritated.  She denies vaginal discharge and states the itching was present yesterday, but not today.  She also expresses concern with bumps on her labial area and requests provider to evaluate.  Patient states she has been taking her Valtrex and needs a refill.  She also request a refill for her Norethindrone.  She denies any abnormal vaginal bleeding, pelvic pain or issues with urination.   Past Medical History:  Diagnosis Date   Anemia    ASCUS with positive high risk HPV 03/22/2013   Chlamydia 06/22/2013   Dysmenorrhea    resolved   Endometriosis    Vitamin D deficiency 03/22/2013    Past Surgical History:  Procedure Laterality Date   DILATION AND CURETTAGE OF UTERUS N/A 11/25/2020   Procedure: SUCTION DILATATION AND CURETTAGE;  Surgeon: Everett Graff, MD;  Location: [place];  Service: Gynecology;  Laterality: N/A;   NO PAST SURGERIES      The following portions of the patient's history were reviewed and updated as appropriate: allergies, current medications, past family history, past medical history, past social history, past surgical history and problem list.   Health Maintenance:  Normal pap and negative HRHPV on July 2023.  No mammogram on file d/t age.   Review of Systems:  Genito-Urinary ROS: no dysuria, trouble voiding, or hematuria Gastrointestinal ROS: no abdominal pain, change in bowel habits, or black or bloody stools Objective:  Vitals: BP 125/89   Pulse 69   Ht 5\' 6"  (1.676 m)   Wt 256 lb (116.1 kg)   LMP 01/16/2022 (Exact Date)   BMI 41.32 kg/m   Physical Exam: Physical Exam Constitutional:      Appearance: Normal appearance.  Genitourinary:     Genitourinary Comments: Visual exam. No lesions or pustules noted.  Area near pubic bone  with some bumps c/w folliculitis.  CV collected blindly.     Right Labia: No tenderness or lesions.    Left Labia: No tenderness or lesions. HENT:     Head: Normocephalic and atraumatic.  Eyes:     Conjunctiva/sclera: Conjunctivae normal.  Cardiovascular:     Rate and Rhythm: Normal rate.  Musculoskeletal:        General: Normal range of motion.  Neurological:     Mental Status: She is alert and oriented to person, place, and time.  Skin:    General: Skin is warm and dry.  Psychiatric:        Mood and Affect: Mood normal.        Behavior: Behavior normal.  Vitals reviewed. Exam conducted with a chaperone present.     Labs and Imaging: No results found.  Assessment & Plan:  32 year old Vaginal Irritation H/O HSV Folliculitis Birth Control Maintenance   -Informed that irritation may be caused from suspected folliculitis. Discussed treatment and follow up with dermatology. Referral placed. -CV collected. Informed that provider will not send in treatment until results final.  -Rx for Valtrex sent to pharmacy on file.  -Rx for Camilia sent to pharmacy on file.  -Encouraged and given information on PCPs for follow up with HTN. -RTO prn or yearly for exam.    Total face-to-face time with patient: 15 minutes   Gavin Pound, CNM 02/07/2022 10:37 AM

## 2022-05-20 ENCOUNTER — Emergency Department (HOSPITAL_BASED_OUTPATIENT_CLINIC_OR_DEPARTMENT_OTHER)
Admission: EM | Admit: 2022-05-20 | Discharge: 2022-05-20 | Discharge status: Against Medical Advice | Payer: 59 | Attending: Emergency Medicine | Admitting: Emergency Medicine

## 2022-05-20 ENCOUNTER — Other Ambulatory Visit: Payer: Self-pay

## 2022-05-20 DIAGNOSIS — F410 Panic disorder [episodic paroxysmal anxiety] without agoraphobia: Secondary | ICD-10-CM | POA: Diagnosis present

## 2022-05-20 DIAGNOSIS — R0682 Tachypnea, not elsewhere classified: Secondary | ICD-10-CM | POA: Insufficient documentation

## 2022-05-20 DIAGNOSIS — Z5321 Procedure and treatment not carried out due to patient leaving prior to being seen by health care provider: Secondary | ICD-10-CM | POA: Insufficient documentation

## 2022-05-20 NOTE — ED Notes (Signed)
Per registration, patient informed staff she is leaving.

## 2022-05-20 NOTE — ED Triage Notes (Signed)
Pt was in car with significant other when she had a panic attack.  Pt denies any stress and they were just going to get food. Pt was tachypneic and crying and feeling panic and shanking.  BP 136 palp CBG 110

## 2022-05-21 ENCOUNTER — Other Ambulatory Visit: Payer: Self-pay

## 2022-05-21 ENCOUNTER — Encounter (HOSPITAL_COMMUNITY): Payer: Self-pay

## 2022-05-21 ENCOUNTER — Emergency Department (HOSPITAL_COMMUNITY)
Admission: EM | Admit: 2022-05-21 | Discharge: 2022-05-21 | Disposition: A | Discharge status: Home / Self Care | Payer: 59 | Attending: Emergency Medicine | Admitting: Emergency Medicine

## 2022-05-21 DIAGNOSIS — R002 Palpitations: Secondary | ICD-10-CM

## 2022-05-21 DIAGNOSIS — N951 Menopausal and female climacteric states: Secondary | ICD-10-CM | POA: Insufficient documentation

## 2022-05-21 DIAGNOSIS — R232 Flushing: Secondary | ICD-10-CM

## 2022-05-21 LAB — URINALYSIS, ROUTINE W REFLEX MICROSCOPIC
Bilirubin Urine: NEGATIVE
Glucose, UA: NEGATIVE mg/dL
Hgb urine dipstick: NEGATIVE
Ketones, ur: 80 mg/dL — AB
Leukocytes,Ua: NEGATIVE
Nitrite: NEGATIVE
Protein, ur: NEGATIVE mg/dL
Specific Gravity, Urine: 1.009 (ref 1.005–1.030)
pH: 6 (ref 5.0–8.0)

## 2022-05-21 LAB — COMPREHENSIVE METABOLIC PANEL
ALT: 15 U/L (ref 0–44)
AST: 18 U/L (ref 15–41)
Albumin: 3.8 g/dL (ref 3.5–5.0)
Alkaline Phosphatase: 69 U/L (ref 38–126)
Anion gap: 12 (ref 5–15)
BUN: 9 mg/dL (ref 6–20)
CO2: 19 mmol/L — ABNORMAL LOW (ref 22–32)
Calcium: 9.2 mg/dL (ref 8.9–10.3)
Chloride: 105 mmol/L (ref 98–111)
Creatinine, Ser: 0.76 mg/dL (ref 0.44–1.00)
GFR, Estimated: >60 mL/min (ref >60)
Glucose, Bld: 108 mg/dL — ABNORMAL HIGH (ref 70–99)
Potassium: 3.6 mmol/L (ref 3.5–5.1)
Sodium: 136 mmol/L (ref 135–145)
Total Bilirubin: 0.6 mg/dL (ref 0.3–1.2)
Total Protein: 7.6 g/dL (ref 6.5–8.1)

## 2022-05-21 LAB — CBC WITH DIFFERENTIAL/PLATELET
Abs Immature Granulocytes: 0.01 10*3/uL (ref 0.00–0.07)
Basophils Absolute: 0 10*3/uL (ref 0.0–0.1)
Basophils Relative: 0 %
Eosinophils Absolute: 0 10*3/uL (ref 0.0–0.5)
Eosinophils Relative: 1 %
HCT: 39.8 % (ref 36.0–46.0)
Hemoglobin: 12.9 g/dL (ref 12.0–15.0)
Immature Granulocytes: 0 %
Lymphocytes Relative: 38 %
Lymphs Abs: 2.2 10*3/uL (ref 0.7–4.0)
MCH: 28.7 pg (ref 26.0–34.0)
MCHC: 32.4 g/dL (ref 30.0–36.0)
MCV: 88.4 fL (ref 80.0–100.0)
Monocytes Absolute: 0.5 10*3/uL (ref 0.1–1.0)
Monocytes Relative: 8 %
Neutro Abs: 3 10*3/uL (ref 1.7–7.7)
Neutrophils Relative %: 53 %
Platelets: 267 10*3/uL (ref 150–400)
RBC: 4.5 MIL/uL (ref 3.87–5.11)
RDW: 13.5 % (ref 11.5–15.5)
WBC: 5.7 10*3/uL (ref 4.0–10.5)
nRBC: 0 % (ref 0.0–0.2)

## 2022-05-21 LAB — I-STAT BETA HCG BLOOD, ED (MC, WL, AP ONLY): I-stat hCG, quantitative: <5 m[IU]/mL (ref <5)

## 2022-05-21 LAB — TSH: TSH: 0.718 u[IU]/mL (ref 0.350–4.500)

## 2022-05-21 MED ORDER — HYDROXYZINE HCL 25 MG PO TABS
25.0000 mg | ORAL_TABLET | Freq: Once | ORAL | Status: AC
  Administered 2022-05-21: 25 mg via ORAL
  Filled 2022-05-21: qty 1

## 2022-05-21 MED ORDER — HYDROXYZINE HCL 25 MG PO TABS: 25.0000 mg | ORAL_TABLET | Freq: Four times a day (QID) | ORAL | 0 refills | Status: DC | PRN

## 2022-05-21 NOTE — Discharge Instructions (Signed)
Workup today overall reassuring. Use atarax as needed every 6 hours for symptoms. Follow myChart for thyroid results. Follow up with primary care for reassessment of your symptoms. Please do not hesitate to return to the ED if the worrisome signs and symptoms we discussed become apparent.

## 2022-05-21 NOTE — ED Triage Notes (Signed)
Pt arrived POV from home c/o anxiety, feeling hot and like her heart was racing. Pt experience another episode like this, this weekend and was told it was a panic attack but per family member it has to be something more because she has never had one and is not stressed about anything. Family is answering all questions for the pt.

## 2022-05-21 NOTE — ED Provider Triage Note (Signed)
Emergency Medicine Provider Triage Evaluation Note  Caitlin Duncan , a 32 y.o. female  was evaluated in triage.  Pt complains of 2 days of feeling hot and tingly all over with SOB that has been constant, called EMS who said it was likely anxiety.  Review of Systems  Positive: SOB, tingling "all over" Negative: Weakness, chest pain, leg swelling, fever, n/v  Physical Exam  BP (!) 138/90   Pulse 98   Temp 98.2 F (36.8 C) (Oral)   Resp 18   Ht 5\' 6"  (1.676 m)   Wt 111.1 kg   SpO2 100%   BMI 39.54 kg/m  Gen:   Awake, no distress   Resp:  Normal effort  MSK:   Moves extremities without difficulty  Other:    Medical Decision Making  Medically screening exam initiated at 12:12 PM.  Appropriate orders placed.  Caitlin Duncan was informed that the remainder of the evaluation will be completed by another provider, this initial triage assessment does not replace that evaluation, and the importance of remaining in the ED until their evaluation is complete.     Ma Rings, [place] 05/21/22 1220

## 2022-05-21 NOTE — ED Provider Notes (Signed)
[place] EMERGENCY DEPARTMENT AT [facility] Provider Note   CSN: [id number] Arrival date & time: 05/21/22  1157     History  Chief Complaint  Patient presents with   Panic Attack    Caitlin Duncan is a 32 y.o. female.  HPI   33 year old female presents emergency department with complaints of shortness of breath, "hot and tingly" all over.  Patient states that she has been dealing with symptoms intermittently over the past few days.  Was driving earlier today when symptoms occurred acutely prompting visit to the emergency department.  States she feels some associated shortness of breath which has persisted since onset.  Denies chest pain, abdominal pain, nausea, vomiting, fever, cough, congestion, urinary/vaginal symptoms, change in bowel habits.  Patient denies any increased anxious feelings during episodes.  Denies history of chronic medical issues.  Home Medications Prior to Admission medications   Medication Sig Start Date End Date Taking? Authorizing Provider  hydrOXYzine (ATARAX) 25 MG tablet Take 1 tablet (25 mg total) by mouth every 6 (six) hours as needed. 05/21/22  Yes Sherian Maroon A, PA  albuterol (VENTOLIN HFA) 108 (90 Base) MCG/ACT inhaler Inhale 2 puffs into the lungs every 4 (four) hours as needed for wheezing or shortness of breath. Patient not taking: Reported on 02/07/2022 08/12/19   Moshe Cipro, NP  Cholecalciferol (VITAMIN D3) 1.25 MG (50000 UT) CAPS Take 1 capsule by mouth once a week. Patient not taking: Reported on 02/07/2022 01/24/21   [provider]  docusate sodium (COLACE) 100 MG capsule Take 1 capsule (100 mg total) by mouth 2 (two) times daily as needed for mild constipation. Patient not taking: Reported on 02/07/2022 11/29/20   Essie Hart, MD  ibuprofen (ADVIL) 800 MG tablet Take 1 tablet (800 mg total) by mouth every 8 (eight) hours as needed. Patient not taking: Reported on 02/07/2022 11/25/20   Osborn Coho, MD  Iron,  Ferrous Sulfate, 325 (65 Fe) MG TABS 1 tablet twice a day. Take with vitamin C source Patient not taking: Reported on 02/07/2022 11/29/20   Essie Hart, MD  metroNIDAZOLE (FLAGYL) 500 MG tablet Take 1 tablet (500 mg total) by mouth 2 (two) times daily. Patient not taking: Reported on 08/08/2021 05/19/21   Constant, Peggy, MD  metroNIDAZOLE (METROGEL) 0.75 % vaginal gel Place 1 Applicatorful vaginally at bedtime. Apply one applicatorful to vagina at bedtime for 5 days Patient not taking: Reported on 02/07/2022 08/10/21   Constant, Peggy, MD  norethindrone (CAMILA) 0.35 MG tablet Take 1 tablet (0.35 mg total) by mouth daily. 02/07/22   [name], CNM  oxyCODONE-acetaminophen (PERCOCET) 5-325 MG tablet Take 1 tablet by mouth every 6 (six) hours as needed for severe pain. Patient not taking: Reported on 05/18/2021 11/25/20   Osborn Coho, MD  SUMAtriptan (IMITREX) 25 MG tablet Take 1 tablet (25 mg total) by mouth every 2 (two) hours as needed for migraine. May repeat in 2 hours if headache persists or recurs. Patient not taking: Reported on 05/18/2021 11/29/20   Essie Hart, MD  triamcinolone ointment (KENALOG) 0.1 % SMARTSIG:sparingly Topical Twice Daily Patient not taking: Reported on 02/07/2022 01/17/21   [provider]  valACYclovir (VALTREX) 500 MG tablet Take 1 tablet (500 mg total) by mouth daily. Can increase to twice a day for 5 days in the event of a recurrence 02/07/22   [name], CNM  cetirizine (ZYRTEC ALLERGY) 10 MG tablet Take 1 tablet (10 mg total) by mouth daily. 05/15/19 06/14/19  Hall-Potvin, [place],  PA-C  fluticasone (FLONASE) 50 MCG/ACT nasal spray Place 1 spray into both nostrils daily. 05/15/19 06/14/19  Hall-Potvin, [place], PA-C      Allergies    Patient has no known allergies.    Review of Systems   Review of Systems  All other systems reviewed and are negative.   Physical Exam Updated Vital Signs BP 137/86   Pulse 70   Temp 98.4 F (36.9 C)   Resp 17    Ht 5\' 6"  (1.676 m)   Wt 111.1 kg   SpO2 99%   BMI 39.54 kg/m  Physical Exam Vitals and nursing note reviewed.  Constitutional:      General: She is not in acute distress.    Appearance: She is well-developed.  HENT:     Head: Normocephalic and atraumatic.  Eyes:     Conjunctiva/sclera: Conjunctivae normal.  Cardiovascular:     Rate and Rhythm: Normal rate and regular rhythm.     Heart sounds: No murmur heard. Pulmonary:     Effort: Pulmonary effort is normal. No respiratory distress.     Breath sounds: Normal breath sounds. No wheezing, rhonchi or rales.  Abdominal:     Palpations: Abdomen is soft.     Tenderness: There is no abdominal tenderness. There is no guarding.  Musculoskeletal:        General: No swelling.     Cervical back: Neck supple.     Right lower leg: No edema.     Left lower leg: No edema.  Skin:    General: Skin is warm and dry.     Capillary Refill: Capillary refill takes less than 2 seconds.  Neurological:     Mental Status: She is alert.  Psychiatric:        Mood and Affect: Mood normal.     ED Results / Procedures / Treatments   Labs (all labs ordered are listed, but only abnormal results are displayed) Labs Reviewed  COMPREHENSIVE METABOLIC PANEL - Abnormal; Notable for the following components:      Result Value   CO2 19 (*)    Glucose, Bld 108 (*)    All other components within normal limits  URINALYSIS, ROUTINE W REFLEX MICROSCOPIC - Abnormal; Notable for the following components:   Ketones, ur 80 (*)    All other components within normal limits  CBC WITH DIFFERENTIAL/PLATELET  TSH  I-STAT BETA HCG BLOOD, ED ([facility], [facility], [facility] ONLY)    EKG EKG Interpretation  Date/Time:  Monday May 21 2022 12:17:05 EDT Ventricular Rate:  88 PR Interval:  134 QRS Duration: 74 QT Interval:  362 QTC Calculation: 438 R Axis:   67 Text Interpretation: Normal sinus rhythm Normal ECG When compared with ECG of 28-Dec-2010 22:52, No significant change since  last tracing Confirmed by Richardean Canal [phone]) on 05/21/2022 3:11:35 PM  Radiology No results found.  Procedures Procedures    Medications Ordered in ED Medications  hydrOXYzine (ATARAX) tablet 25 mg (25 mg Oral Given 05/21/22 1554)    ED Course/ Medical Decision Making/ A&P                             Medical Decision Making Amount and/or Complexity of Data Reviewed Labs: ordered.  Risk Prescription drug management.   This patient presents to the ED for concern of hot flashes/shortness of breath, this involves an extensive number of treatment options, and is a complaint that carries with it a  high risk of complications and morbidity.  The differential diagnosis includes The causes for shortness of breath include but are not limited to Cardiac (AHF, pericardial effusion and tamponade, arrhythmias, ischemia, etc) Respiratory (COPD, asthma, pneumonia, pneumothorax, primary pulmonary hypertension, PE/VQ mismatch) Hematological (anemia) PE, hypothyroidism, peri-/postmenopausal,    Co morbidities that complicate the patient evaluation  See HPI   Additional history obtained:  Additional history obtained from EMR External records from outside source obtained and reviewed including hospital records   Lab Tests:  I Ordered, and personally interpreted labs.  The pertinent results include: No leukocytosis noted.  No evidence anemia.  Platelets within normal range.  Mild decrease in bicarb of 19 but otherwise, electrolytes within normal limits.  No transaminitis.  No renal dysfunction.  Beta-hCG negative.  UA significant for 80 ketones but otherwise unremarkable.   Imaging Studies ordered:  N/a   Cardiac Monitoring: / EKG:  The patient was maintained on a cardiac monitor.  I personally viewed and interpreted the cardiac monitored which showed an underlying rhythm of: Normal sinus rhythm   Consultations Obtained:  N/a   Problem List / ED Course / Critical  interventions / Medication management  Hot flashes, palpitations I ordered medication including hydroxyzine   Reevaluation of the patient after these medicines showed that the patient improved I have reviewed the patients home medicines and have made adjustments as needed   Social Determinants of Health:  Denies tobacco, illicit drug use.   Test / Admission - Considered:  Hot flashes, palpitations Vitals signs within normal range and stable throughout visit. Laboratory/imaging studies significant for: See above 32 year old female presents with hot flashes, palpitations and feelings of shortness of breath.  Patient without any self-reported feelings of anxiety/increased life stressors.  Symptoms seem to be random in nature without any known exacerbating or relieving factors.  Workup today overall reassuring without evidence of acute infectious etiology appreciable on HPI or on physical exam.  Query possible hyperthyroidism with TSH pending at this time.  Patient did note some improvement with administration of Atarax while in the emergency department so we will continue prescription at home to use as needed.  Recommend follow-up with primary care for reassessment of symptoms.  Treatment plan discussed at length with patient and she acknowledged understanding was agreeable to said plan. Worrisome signs and symptoms were discussed with the patient, and the patient acknowledged understanding to return to the ED if noticed. Patient was stable upon discharge.          Final Clinical Impression(s) / ED Diagnoses Final diagnoses:  Hot flashes  Palpitation    Rx / DC Orders ED Discharge Orders          Ordered    hydrOXYzine (ATARAX) 25 MG tablet  Every 6 hours PRN        05/21/22 1653              Peter Garter, [place] 05/21/22 1850    Loetta Rough, MD 05/22/22 [id number]

## 2022-07-08 ENCOUNTER — Encounter (HOSPITAL_COMMUNITY): Payer: Self-pay

## 2022-07-08 ENCOUNTER — Emergency Department (HOSPITAL_COMMUNITY)
Admission: EM | Admit: 2022-07-08 | Discharge: 2022-07-09 | Disposition: A | Discharge status: Home / Self Care | Payer: 59 | Attending: Emergency Medicine | Admitting: Emergency Medicine

## 2022-07-08 ENCOUNTER — Other Ambulatory Visit: Payer: Self-pay

## 2022-07-08 DIAGNOSIS — R002 Palpitations: Secondary | ICD-10-CM | POA: Insufficient documentation

## 2022-07-08 DIAGNOSIS — R799 Abnormal finding of blood chemistry, unspecified: Secondary | ICD-10-CM | POA: Diagnosis not present

## 2022-07-08 LAB — BASIC METABOLIC PANEL
Anion gap: 14 (ref 5–15)
BUN: 11 mg/dL (ref 6–20)
CO2: 21 mmol/L — ABNORMAL LOW (ref 22–32)
Calcium: 9.9 mg/dL (ref 8.9–10.3)
Chloride: 102 mmol/L (ref 98–111)
Creatinine, Ser: 0.76 mg/dL (ref 0.44–1.00)
GFR, Estimated: >60 mL/min (ref >60)
Glucose, Bld: 93 mg/dL (ref 70–99)
Potassium: 3.5 mmol/L (ref 3.5–5.1)
Sodium: 137 mmol/L (ref 135–145)

## 2022-07-08 LAB — CBC
HCT: 45.2 % (ref 36.0–46.0)
Hemoglobin: 14.3 g/dL (ref 12.0–15.0)
MCH: 28 pg (ref 26.0–34.0)
MCHC: 31.6 g/dL (ref 30.0–36.0)
MCV: 88.5 fL (ref 80.0–100.0)
Platelets: 268 10*3/uL (ref 150–400)
RBC: 5.11 MIL/uL (ref 3.87–5.11)
RDW: 13.6 % (ref 11.5–15.5)
WBC: 8.3 10*3/uL (ref 4.0–10.5)
nRBC: 0 % (ref 0.0–0.2)

## 2022-07-08 LAB — I-STAT BETA HCG BLOOD, ED (MC, WL, AP ONLY): I-stat hCG, quantitative: <5 m[IU]/mL (ref <5)

## 2022-07-08 LAB — CBG MONITORING, ED: Glucose-Capillary: 92 mg/dL (ref 70–99)

## 2022-07-08 MED ORDER — SODIUM CHLORIDE 0.9 % IV BOLUS
1000.0000 mL | Freq: Once | INTRAVENOUS | Status: AC
  Administered 2022-07-09: 1000 mL via INTRAVENOUS

## 2022-07-08 NOTE — ED Notes (Signed)
Attempted to stick patient for blood work, pt grabbed this techs hand and tried to pull away. Pt told this writer to get someone else to do it. RN notified.

## 2022-07-08 NOTE — ED Triage Notes (Signed)
Pt arrives from home via POV c/o feeling like her blood is boiling. Pt states that she felt like she was having palpitations earlier and felt like she was going to pass out.

## 2022-07-09 LAB — URINALYSIS, ROUTINE W REFLEX MICROSCOPIC
Bilirubin Urine: NEGATIVE
Glucose, UA: NEGATIVE mg/dL
Ketones, ur: 20 mg/dL — AB
Leukocytes,Ua: NEGATIVE
Nitrite: NEGATIVE
Protein, ur: NEGATIVE mg/dL
Specific Gravity, Urine: 1.006 (ref 1.005–1.030)
pH: 5 (ref 5.0–8.0)

## 2022-07-09 MED ORDER — METOPROLOL TARTRATE 25 MG PO TABS: 25.0000 mg | ORAL_TABLET | Freq: Two times a day (BID) | ORAL | 0 refills | Status: DC | PRN

## 2022-07-09 NOTE — ED Provider Notes (Signed)
[place] EMERGENCY DEPARTMENT AT [facility] Provider Note   CSN: [id number] Arrival date & time: 07/08/22  2106     History  Chief Complaint  Patient presents with   Palpitations    Caitlin Duncan is a 32 y.o. female.  Patient returns to the emergency department with complaints of heart palpitations.  Patient reports that she has been having daily episodes.  She reports that she gets hot flashes and feels like her "blood is boiling".  When this occurs she then notices that her heart is racing.  She has been seen in the emergency department and has seen her doctor.  She reports that she just recently had her thyroid checked and it was normal.  She has been referred to cardiology, appointment has not occurred yet.       Home Medications Prior to Admission medications   Medication Sig Start Date End Date Taking? Authorizing Provider  metoprolol tartrate (LOPRESSOR) 25 MG tablet Take 1 tablet (25 mg total) by mouth 2 (two) times daily as needed (palpitations). 07/09/22  Yes Gwendlyn Hanback, Canary Brim, MD  albuterol (VENTOLIN HFA) 108 (90 Base) MCG/ACT inhaler Inhale 2 puffs into the lungs every 4 (four) hours as needed for wheezing or shortness of breath. Patient not taking: Reported on 02/07/2022 08/12/19   Moshe Cipro, NP  Cholecalciferol (VITAMIN D3) 1.25 MG (50000 UT) CAPS Take 1 capsule by mouth once a week. Patient not taking: Reported on 02/07/2022 01/24/21   [provider]  docusate sodium (COLACE) 100 MG capsule Take 1 capsule (100 mg total) by mouth 2 (two) times daily as needed for mild constipation. Patient not taking: Reported on 02/07/2022 11/29/20   Essie Hart, MD  hydrOXYzine (ATARAX) 25 MG tablet Take 1 tablet (25 mg total) by mouth every 6 (six) hours as needed. 05/21/22   Peter Garter, PA  ibuprofen (ADVIL) 800 MG tablet Take 1 tablet (800 mg total) by mouth every 8 (eight) hours as needed. Patient not taking: Reported on 02/07/2022 11/25/20    Osborn Coho, MD  Iron, Ferrous Sulfate, 325 (65 Fe) MG TABS 1 tablet twice a day. Take with vitamin C source Patient not taking: Reported on 02/07/2022 11/29/20   Essie Hart, MD  metroNIDAZOLE (FLAGYL) 500 MG tablet Take 1 tablet (500 mg total) by mouth 2 (two) times daily. Patient not taking: Reported on 08/08/2021 05/19/21   Constant, Peggy, MD  metroNIDAZOLE (METROGEL) 0.75 % vaginal gel Place 1 Applicatorful vaginally at bedtime. Apply one applicatorful to vagina at bedtime for 5 days Patient not taking: Reported on 02/07/2022 08/10/21   Constant, Peggy, MD  norethindrone (CAMILA) 0.35 MG tablet Take 1 tablet (0.35 mg total) by mouth daily. 02/07/22   [name], CNM  oxyCODONE-acetaminophen (PERCOCET) 5-325 MG tablet Take 1 tablet by mouth every 6 (six) hours as needed for severe pain. Patient not taking: Reported on 05/18/2021 11/25/20   Osborn Coho, MD  SUMAtriptan (IMITREX) 25 MG tablet Take 1 tablet (25 mg total) by mouth every 2 (two) hours as needed for migraine. May repeat in 2 hours if headache persists or recurs. Patient not taking: Reported on 05/18/2021 11/29/20   Essie Hart, MD  triamcinolone ointment (KENALOG) 0.1 % SMARTSIG:sparingly Topical Twice Daily Patient not taking: Reported on 02/07/2022 01/17/21   [provider]  valACYclovir (VALTREX) 500 MG tablet Take 1 tablet (500 mg total) by mouth daily. Can increase to twice a day for 5 days in the event of a recurrence 02/07/22  [name], CNM  cetirizine (ZYRTEC ALLERGY) 10 MG tablet Take 1 tablet (10 mg total) by mouth daily. 05/15/19 06/14/19  Hall-Potvin, [place], PA-C  fluticasone (FLONASE) 50 MCG/ACT nasal spray Place 1 spray into both nostrils daily. 05/15/19 06/14/19  Hall-Potvin, [place], PA-C      Allergies    Patient has no known allergies.    Review of Systems   Review of Systems  Physical Exam Updated Vital Signs BP (!) 124/92   Pulse 71   Temp 98.5 F (36.9 C) (Oral)   Resp 16   Ht 5\' 6"   (1.676 m)   Wt 109.8 kg   LMP 06/16/2022 (Exact Date)   SpO2 99%   BMI 39.06 kg/m  Physical Exam Vitals and nursing note reviewed.  Constitutional:      General: She is not in acute distress.    Appearance: She is well-developed.  HENT:     Head: Normocephalic and atraumatic.     Mouth/Throat:     Mouth: Mucous membranes are moist.  Eyes:     General: Vision grossly intact. Gaze aligned appropriately.     Extraocular Movements: Extraocular movements intact.     Conjunctiva/sclera: Conjunctivae normal.  Cardiovascular:     Rate and Rhythm: Normal rate and regular rhythm.     Pulses: Normal pulses.     Heart sounds: Normal heart sounds, S1 normal and S2 normal. No murmur heard.    No friction rub. No gallop.  Pulmonary:     Effort: Pulmonary effort is normal. No respiratory distress.     Breath sounds: Normal breath sounds.  Abdominal:     General: Bowel sounds are normal.     Palpations: Abdomen is soft.     Tenderness: There is no abdominal tenderness. There is no guarding or rebound.     Hernia: No hernia is present.  Musculoskeletal:        General: No swelling.     Cervical back: Full passive range of motion without pain, normal range of motion and neck supple. No spinous process tenderness or muscular tenderness. Normal range of motion.     Right lower leg: No edema.     Left lower leg: No edema.  Skin:    General: Skin is warm and dry.     Capillary Refill: Capillary refill takes less than 2 seconds.     Findings: No ecchymosis, erythema, rash or wound.  Neurological:     General: No focal deficit present.     Mental Status: She is alert and oriented to person, place, and time.     GCS: GCS eye subscore is 4. GCS verbal subscore is 5. GCS motor subscore is 6.     Cranial Nerves: Cranial nerves 2-12 are intact.     Sensory: Sensation is intact.     Motor: Motor function is intact.     Coordination: Coordination is intact.  Psychiatric:        Attention and  Perception: Attention normal.        Mood and Affect: Mood normal.        Speech: Speech normal.        Behavior: Behavior normal.     ED Results / Procedures / Treatments   Labs (all labs ordered are listed, but only abnormal results are displayed) Labs Reviewed  BASIC METABOLIC PANEL - Abnormal; Notable for the following components:      Result Value   CO2 21 (*)    All other components within  normal limits  CBC  URINALYSIS, ROUTINE W REFLEX MICROSCOPIC  CBG MONITORING, ED  I-STAT BETA HCG BLOOD, ED ([facility], [facility], [facility] ONLY)    EKG EKG Interpretation  Date/Time:  Sunday July 08 2022 21:35:48 EDT Ventricular Rate:  96 PR Interval:  130 QRS Duration: 76 QT Interval:  348 QTC Calculation: 439 R Axis:   54 Text Interpretation: Normal sinus rhythm with sinus arrhythmia Normal ECG When compared with ECG of 21-May-2022 12:17, PREVIOUS ECG IS PRESENT Confirmed by Raphel Stickles J ([id number]) on 07/08/2022 11:29:10 PM  Radiology No results found.  Procedures Procedures    Medications Ordered in ED Medications  sodium chloride 0.9 % bolus 1,000 mL (1,000 mLs Intravenous Bolus 07/09/22 0024)    ED Course/ Medical Decision Making/ A&P                             Medical Decision Making Amount and/or Complexity of Data Reviewed Labs: ordered.   Differential diagnosis considered includes, but not limited to: Arrhythmia; sinus tachycardia; anxiety; endocrine abnormality  Patient was tachycardic and hypertensive at arrival.  This has resolved, she is now normotensive and her heart rate is in the 70s.  Etiology of her hot flashes and rapid heart rate is unclear.  It has been ongoing for more than a month.  She reports that it occurs daily.  She is undergoing appropriate outpatient workup but has become frustrated because the symptoms occur daily.  Patient with mild sinus tachycardia here and sinus rhythm in the department.  No noted arrhythmia.  No anemia, no infectious symptoms.   Lungs are clear, no tachypnea or hypoxia.  Symptoms intermittent daily for more than a month, doubt PE.  Trial of low-dose Lopressor while she continues her outpatient workup.  Counseled patient that we do not see any dangerous medical condition that would prevent her from going home and completing her outpatient workup.        Final Clinical Impression(s) / ED Diagnoses Final diagnoses:  Heart palpitations    Rx / DC Orders ED Discharge Orders          Ordered    metoprolol tartrate (LOPRESSOR) 25 MG tablet  2 times daily PRN        06 /17/24 0100              Gilda Crease, MD 07/09/22 0100

## 2022-07-10 NOTE — Progress Notes (Unsigned)
  Cardiology Office Note:   Date:  07/17/2022  ID:  EILIANA NEGRONI, DOB 1990-04-06, MRN [id number]  History of Present Illness:   Caitlin Duncan is a 32 y.o. female with history of anemia who was referred by Dayton Scrape, FNP for further evaluation of palpitations.  Patient seen in [facility] ER on 07/09/22. Notes reviewed. Had episode of palpitations. There, ECG with NSR. CBC, BMET unremarkable. TSH in 04/2022 normal.  Today, the patient states that over the past 2 months she has been having episodes of palpitations with associated lightheadedness. She was evaluated in the ER on several occasions for symptoms where work-up has been reassuring. Symptoms are occurring daily and can last all day but have improved some with the metoprolol. No SOB, LE edema, orthopnea or PND. Has regular menses and symptoms do not correlate with ovulation or menstrual cycle. Remains hydrated. No caffeine use.   Past Medical History:  Diagnosis Date   Anemia    ASCUS with positive high risk HPV 03/22/2013   Chlamydia 06/22/2013   Dysmenorrhea    resolved   Endometriosis    Vitamin D deficiency 03/22/2013     ROS: As per HPI  Studies Reviewed:    EKG:  NSR with HR 96-personally reviewed       Risk Assessment/Calculations:              Physical Exam:   VS:  BP 120/72   Ht 5\' 6"  (1.676 m)   Wt 242 lb (109.8 kg)   LMP 06/16/2022 (Exact Date)   BMI 39.06 kg/m    Wt Readings from Last 3 Encounters:  07/17/22 242 lb (109.8 kg)  07/08/22 242 lb (109.8 kg)  05/21/22 245 lb (111.1 kg)     GEN: Well nourished, well developed in no acute distress NECK: No JVD; No carotid bruits CARDIAC: RRR, no murmurs, rubs, gallops RESPIRATORY:  Clear to auscultation without rales, wheezing or rhonchi  ABDOMEN: Soft, non-tender, non-distended EXTREMITIES:  No edema; No deformity   ASSESSMENT AND PLAN:   #Palpitations: -Reassuring ER work-up but with continued significant symptoms -TSH and HgB  normal -Check 3 day zio monitor for further evaluation -Continue metop 25mg  BID prn for palps -Increase hydration with electrolyte replacement -Continue Mg supplementation at night -No caffeine use          Signed, Meriam Sprague, MD

## 2022-07-17 ENCOUNTER — Encounter: Payer: Self-pay | Admitting: Cardiology

## 2022-07-17 ENCOUNTER — Ambulatory Visit: Payer: 59 | Attending: Cardiology | Admitting: Cardiology

## 2022-07-17 ENCOUNTER — Telehealth: Payer: Self-pay | Admitting: *Deleted

## 2022-07-17 ENCOUNTER — Ambulatory Visit (INDEPENDENT_AMBULATORY_CARE_PROVIDER_SITE_OTHER): Payer: 59

## 2022-07-17 VITALS — BP 120/72 | Ht 66.0 in | Wt 242.0 lb

## 2022-07-17 DIAGNOSIS — R002 Palpitations: Secondary | ICD-10-CM

## 2022-07-17 NOTE — Telephone Encounter (Signed)
-----   Message from Flavia Shipper sent at 07/17/2022  4:23 PM EDT ----- Regarding: RE: 3 DAY ZIO PER DR. Shari Prows done ----- Message ----- From: Loa Socks, LPN Sent: [id number]   4:09 PM EDT To: Ernst Bowler; Katrina Claria Dice Subject: 3 DAY ZIO PER DR. Shari Prows                    3 day zio for palpitations  Please enroll and let me know when you do  Thanks Deby Adger

## 2022-07-17 NOTE — Progress Notes (Unsigned)
Enrolled patient for a 3 day Zio XT monitor to be mailed to patients home  

## 2022-07-17 NOTE — Patient Instructions (Signed)
Medication Instructions:   Your physician recommends that you continue on your current medications as directed. Please refer to the Current Medication list given to you today.  *If you need a refill on your cardiac medications before your next appointment, please call your pharmacy*    Testing/Procedures:  [place] Monitor Instructions  Your physician has requested you wear a ZIO patch monitor for 3 days.  This is a single patch monitor. [employer] supplies one patch monitor per enrollment. Additional stickers are not available. Please do not apply patch if you will be having a Nuclear Stress Test,  Echocardiogram, Cardiac CT, MRI, or Chest Xray during the period you would be wearing the  monitor. The patch cannot be worn during these tests. You cannot remove and re-apply the  ZIO XT patch monitor.  Your ZIO patch monitor will be mailed 3 day USPS to your address on file. It may take 3-5 days  to receive your monitor after you have been enrolled.  Once you have received your monitor, please review the enclosed instructions. Your monitor  has already been registered assigning a specific monitor serial # to you.  Billing and Patient Assistance Program Information  We have supplied [employer] with any of your insurance information on file for billing purposes. [employer] offers a sliding scale Patient Assistance Program for patients that do not have  insurance, or whose insurance does not completely cover the cost of the ZIO monitor.  You must apply for the Patient Assistance Program to qualify for this discounted rate.  To apply, please call [employer] at [phone], select option 4, select option 2, ask to apply for  Patient Assistance Program. Theodore Demark will ask your household income, and how many people  are in your household. They will quote your out-of-pocket cost based on that information.  [employer] will also be able to set up a 63-month interest-free payment plan if  needed.  Applying the monitor   Shave hair from upper left chest.  Hold abrader disc by orange tab. Rub abrader in 40 strokes over the upper left chest as  indicated in your monitor instructions.  Clean area with 4 enclosed alcohol pads. Let dry.  Apply patch as indicated in monitor instructions. Patch will be placed under collarbone on left  side of chest with arrow pointing upward.  Rub patch adhesive wings for 2 minutes. Remove white label marked "1". Remove the white  label marked "2". Rub patch adhesive wings for 2 additional minutes.  While looking in a mirror, press and release button in center of patch. A small green light will  flash 3-4 times. This will be your only indicator that the monitor has been turned on.  Do not shower for the first 24 hours. You may shower after the first 24 hours.  Press the button if you feel a symptom. You will hear a small click. Record Date, Time and  Symptom in the Patient Logbook.  When you are ready to remove the patch, follow instructions on the last 2 pages of Patient  Logbook. Stick patch monitor onto the last page of Patient Logbook.  Place Patient Logbook in the blue and white box. Use locking tab on box and tape box closed  securely. The blue and white box has prepaid postage on it. Please place it in the mailbox as  soon as possible. Your physician should have your test results approximately 7 days after the  monitor has been mailed back to [facility]  Call [employer] Customer Care at [phone] if you have questions regarding  your ZIO XT patch monitor. Call them immediately if you see an orange light blinking on your  monitor.  If your monitor falls off in less than 4 days, contact our Monitor department at [phone].  If your monitor becomes loose or falls off after 4 days call [employer] at [phone] for  suggestions on securing your monitor    Follow-Up:  3 MONTHS WITH AN EXTENDER IN THE OFFICE

## 2022-07-23 DIAGNOSIS — R002 Palpitations: Secondary | ICD-10-CM | POA: Diagnosis not present

## 2022-08-15 ENCOUNTER — Telehealth: Payer: Self-pay | Admitting: Cardiology

## 2022-08-15 MED ORDER — METOPROLOL TARTRATE 25 MG PO TABS: 25.0000 mg | ORAL_TABLET | Freq: Two times a day (BID) | ORAL | 3 refills | Status: DC | PRN

## 2022-08-15 NOTE — Telephone Encounter (Signed)
*  STAT* If patient is at the pharmacy, call can be transferred to refill team.   1. Which medications need to be refilled? (please list name of each medication and dose if known)  metoprolol tartrate (LOPRESSOR) 25 MG tablet   2. Which pharmacy/location (including street and city if local pharmacy) is medication to be sent to? [id number] - [place], [place] - [address]   3. Do they need a 30 day or 90 day supply? 90 day supply   

## 2022-08-15 NOTE — Telephone Encounter (Signed)
Pt's medication was sent to pt's pharmacy as requested. Confirmation received.  °

## 2022-09-10 ENCOUNTER — Encounter: Payer: 59 | Admitting: Obstetrics and Gynecology

## 2022-10-17 ENCOUNTER — Encounter: Payer: Self-pay | Admitting: Physician Assistant

## 2022-10-17 ENCOUNTER — Ambulatory Visit: Payer: 59 | Attending: Physician Assistant | Admitting: Physician Assistant

## 2022-10-17 VITALS — BP 102/62 | HR 64 | Ht 66.0 in | Wt 237.6 lb

## 2022-10-17 DIAGNOSIS — R232 Flushing: Secondary | ICD-10-CM | POA: Diagnosis not present

## 2022-10-17 DIAGNOSIS — R002 Palpitations: Secondary | ICD-10-CM | POA: Diagnosis not present

## 2022-10-17 DIAGNOSIS — R030 Elevated blood-pressure reading, without diagnosis of hypertension: Secondary | ICD-10-CM

## 2022-10-17 MED ORDER — NADOLOL 20 MG PO TABS: ORAL_TABLET | ORAL | 0 refills | Status: DC

## 2022-10-17 MED ORDER — ATENOLOL 25 MG PO TABS: ORAL_TABLET | ORAL | 0 refills | Status: DC

## 2022-10-17 MED ORDER — METOPROLOL SUCCINATE ER 25 MG PO TB24: ORAL_TABLET | ORAL | 0 refills | Status: DC

## 2022-10-17 NOTE — Patient Instructions (Signed)
Medication Instructions:  Your physician has recommended you make the following change in your medication:   STOP Metoprolol  I have sent in 14 days worth of: Nadolol 25  Atenolol 25  Toprol XL 25    TAKE 1 TABLET OF EACH FOR 2 WEEKS.  CALL us WHEN YOU FIND THE ONE THAT HELPS THE PALPITATIONS BEST AND WE WILL SEND IN A PERMANENT PRESCRIPTION   *If you need a refill on your cardiac medications before your next appointment, please call your pharmacy*   Lab Work: TODAY:  BMET, MAG, & CATECHOLAMINES, FRACTIONATED, PLASMA  If you have labs (blood work) drawn today and your tests are completely normal, you will receive your results only by: MyChart Message (if you have MyChart) OR A paper copy in the mail If you have any lab test that is abnormal or we need to change your treatment, we will call you to review the results.   Testing/Procedures: None ordered   Follow-Up: At [facility], you and your health needs are our priority.  As part of our continuing mission to provide you with exceptional heart care, we have created designated Provider Care Teams.  These Care Teams include your primary Cardiologist (physician) and Advanced Practice Providers (APPs -  Physician Assistants and Nurse Practitioners) who all work together to provide you with the care you need, when you need it.  We recommend signing up for the patient portal called "MyChart".  Sign up information is provided on this After Visit Summary.  MyChart is used to connect with patients for Virtual Visits (Telemedicine).  Patients are able to view lab/test results, encounter notes, upcoming appointments, etc.  Non-urgent messages can be sent to your provider as well.   To learn more about what you can do with MyChart, go to [email or web address].    Your next appointment:   6 month(s)  Provider:   Tereso Newcomer, PA-C         Other Instructions FOLLOW UP WITH YOUR PRIMARY CARE DR AS WELL

## 2022-10-17 NOTE — Progress Notes (Signed)
Cardiology Office Note:    Date:  10/17/2022  ID:  Caitlin Duncan, DOB 1990/10/14, MRN [id number] PCP: Pcp, No  [facility] Providers Cardiologist:  None       Patient Profile:      Palpitations Monitor 08/03/2022: NSR; <1% PACs/PVCs Anemia         History of Present Illness:  Discussed the use of AI scribe software for clinical note transcription with the patient, who gave verbal consent to proceed.   Caitlin Duncan is a 32 y.o. female who returns for follow-up on palpitations.  She was evaluated by Dr. Shari Prows 07/17/2022.  The patient had recently been to the emergency room with symptoms of palpitations.  She was given metoprolol tartrate 25 mg twice daily for palpitations to be used as needed.  3-day ZIO monitor was normal.  Symptoms were reported with normal sinus rhythm.  She is here alone.  She notes persistent symptoms despite a normal heart monitor. She describes a sensation of heat in her hands and chest, and a feeling of her heart beating too fast. These symptoms occur upon waking and can persist throughout the day, regardless of whether she is standing or sitting. She has been monitoring her blood pressure twice daily and has noticed it can be elevated, particularly if she has not eaten. She has a history of fainting, which led to an ER visit and a diagnosis of anxiety (prior to her evaluation here).  She participates in regular exercise She denies any chest pain, shortness of breath, or swelling in her legs, recurrent syncope.      ROS:  See the HPI    Studies Reviewed:        Risk Assessment/Calculations:             Physical Exam:   VS:  BP 102/62   Pulse 64   Ht 5\' 6"  (1.676 m)   Wt 237 lb 9.6 oz (107.8 kg)   BMI 38.35 kg/m    Wt Readings from Last 3 Encounters:  10/17/22 237 lb 9.6 oz (107.8 kg)  07/17/22 242 lb (109.8 kg)  07/08/22 242 lb (109.8 kg)    Constitutional:      Appearance: Healthy appearance. Not in distress.  Neck:      Thyroid: Thyroid normal.  Pulmonary:     Breath sounds: Normal breath sounds. No wheezing. No rales.  Cardiovascular:     Normal rate. Regular rhythm.     Murmurs: There is no murmur.  Edema:    Peripheral edema absent.  Abdominal:     Palpations: Abdomen is soft.        Assessment and Plan:      Palpitations Symptoms of palpitations and flushing, with normal heart rhythm on monitor during symptomatic episodes.  She has had noted low potassium in the past.  Unlikely pheochromocytoma, but will rule out due to symptomatology.  Her symptoms are not consistent with POTS.  TSH has been normal.  Question if her symptoms may be related to other type of hormonal issue versus hypoglycemia. -Order plasma free metanephrines to rule out pheochromocytoma. -Check BMET, magnesium -If potassium or magnesium levels are low, initiate replacement therapy. -Trial of three different beta blockers (Nadolol 20mg  daily, Atenolol 25mg  daily, Toprol XL 25mg  daily) to see if symptoms improve with 1 of these. -DC metoprolol tartrate -Encourage increased fluid and electrolyte intake. -Recommend follow-up with primary care provider for further evaluation of potential hormonal causes. -Follow-up in 6 months.  Dispo:  Return in about 6 months (around 04/16/2023) for Routine Follow Up, w/ Tereso Newcomer, PA-C.  Signed, Tereso Newcomer, PA-C

## 2022-10-18 ENCOUNTER — Telehealth: Payer: Self-pay | Admitting: Physician Assistant

## 2022-10-18 MED ORDER — ATENOLOL 25 MG PO TABS: ORAL_TABLET | ORAL | 0 refills | Status: DC

## 2022-10-18 MED ORDER — METOPROLOL SUCCINATE ER 25 MG PO TB24: ORAL_TABLET | ORAL | 0 refills | Status: DC

## 2022-10-18 MED ORDER — NADOLOL 20 MG PO TABS: ORAL_TABLET | ORAL | 0 refills | Status: DC

## 2022-10-18 NOTE — Telephone Encounter (Signed)
Pt's medications were resent to pt's pharmacy as requested. The 1st Rx's was on print. Confirmation received.

## 2022-10-18 NOTE — Telephone Encounter (Signed)
*  STAT* If patient is at the pharmacy, call can be transferred to refill team.   1. Which medications need to be refilled? (please list name of each medication and dose if known)   atenolol (TENORMIN) 25 MG tablet    nadolol (CORGARD) 20 MG tablet   metoprolol succinate (TOPROL XL) 25 MG 24 hr tablet  2. Which pharmacy/location (including street and city if local pharmacy) is medication to be sent to?[id number] - [place], [place] - [address]   3. Do they need a 30 day or 90 day supply? 30 day

## 2022-10-29 LAB — BASIC METABOLIC PANEL
BUN/Creatinine Ratio: 17 (ref 9–23)
BUN: 12 mg/dL (ref 6–20)
CO2: 21 mmol/L (ref 20–29)
Calcium: 9.5 mg/dL (ref 8.7–10.2)
Chloride: 107 mmol/L — ABNORMAL HIGH (ref 96–106)
Creatinine, Ser: 0.72 mg/dL (ref 0.57–1.00)
Glucose: 86 mg/dL (ref 70–99)
Potassium: 4.4 mmol/L (ref 3.5–5.2)
Sodium: 143 mmol/L (ref 134–144)
eGFR: 114 mL/min/{1.73_m2} (ref >59)

## 2022-10-29 LAB — CATECHOLAMINES, FRACTIONATED, PLASMA
Dopamine: 38 pg/mL (ref 0–48)
Epinephrine: <15 pg/mL (ref 0–62)
Norepinephrine: 326 pg/mL (ref 0–874)

## 2022-10-29 LAB — MAGNESIUM: Magnesium: 2.1 mg/dL (ref 1.6–2.3)

## 2022-11-05 ENCOUNTER — Telehealth: Payer: Self-pay | Admitting: Cardiology

## 2022-11-05 ENCOUNTER — Telehealth: Payer: Self-pay | Admitting: Physician Assistant

## 2022-11-05 MED ORDER — METOPROLOL SUCCINATE ER 25 MG PO TB24: 25.0000 mg | ORAL_TABLET | Freq: Every day | ORAL | 1 refills | Status: DC

## 2022-11-05 NOTE — Telephone Encounter (Signed)
*  STAT* If patient is at the pharmacy, call can be transferred to refill team.   1. Which medications need to be refilled? (please list name of each medication and dose if known)  metoprolol succinate (TOPROL-XL) 25 MG 24 hr tablet   2. Which pharmacy/location (including street and city if local pharmacy) is medication to be sent to? [id number] - [place], [place] - [address] 3. Do they need a 30 day or 90 day supply? 90

## 2022-11-05 NOTE — Telephone Encounter (Signed)
Patient returned staff call regarding results.

## 2022-11-05 NOTE — Telephone Encounter (Signed)
Returned call to pt and she has been made aware of normal lab results.

## 2022-11-05 NOTE — Progress Notes (Signed)
Pt has been made aware of normal result and verbalized understanding.  jw

## 2022-11-05 NOTE — Telephone Encounter (Signed)
Patient called again stating she is out of this medication.

## 2022-11-05 NOTE — Telephone Encounter (Signed)
Pt stated she tried Atenolol and Metoprolol XL 2 mg and the Metoprolol made her feel better. Sent in a refill, per pt's request.  Will send to Tereso Newcomer, PA-C to make him aware.  Atenolol and Nadolol has been removed from pt's med list.

## 2023-01-23 NOTE — L&D Delivery Note (Signed)
 OB/GYN Faculty Practice Delivery Note  Caitlin Duncan is a 33 y.o. G3P0020 s/p NSVD at [redacted]w[redacted]d. She was admitted for preterm labor.   ROM:  25h95m with clear fluid GBS Status: PCR presumptive negative, single dose of penicillin  given Maximum Maternal Temperature: 98.54F  Labor Progress: Progressed to complete with Pitocin   Delivery Date/Time: 07/28/23 1056 Delivery: Called to room. Patient had been pushing with RN for short time. Pushed with 2 contractions and head delivered ROA. No nuchal cord present. Shoulder and body delivered in usual fashion. Infant with spontaneous cry, placed on mother's abdomen, dried and stimulated. Cord clamped x 2 after 1-minute delay, and cut by patient's mother. Baby to warmer to be evaluated by NICU. Baby returned to mom for a short time before being taken to NICU by NICU team as planned for feeding and monitoring. Cord blood collected. Placenta delivered spontaneously, intact, with 3-vessel cord. Fundus firm with massage and Pitocin . Labia, perineum, vagina, and cervix inspected; 2nd degree perineal laceration found, repaired with 3.0 monocryl.   Placenta: intact, to L&D Complications: none Lacerations: 2nd degree perineal, repaired with 3.0 monocryl EBL: 302 mL Analgesia: epidural  Infant: female  APGARs 8,9  2450g  Vernell Ruddle, SNM  07/28/2023, 11:36 AM

## 2023-02-06 ENCOUNTER — Ambulatory Visit: Payer: 59 | Admitting: Emergency Medicine

## 2023-02-06 VITALS — BP 131/89 | HR 66 | Ht 66.0 in | Wt 246.0 lb

## 2023-02-06 DIAGNOSIS — Z3201 Encounter for pregnancy test, result positive: Secondary | ICD-10-CM | POA: Diagnosis not present

## 2023-02-06 LAB — POCT URINE PREGNANCY: Preg Test, Ur: POSITIVE — AB

## 2023-02-06 NOTE — Progress Notes (Signed)
Caitlin Duncan presents today for UPT. She has no unusual complaints and complains of nausea with vomiting for 30 days.  LMP: 11/28/2025    OBJECTIVE: Appears well, in no apparent distress.  OB History     Gravida  3   Para  0   Term  0   Preterm  0   AB  2   Living  0      SAB  1   IAB  1   Ectopic  0   Multiple  0   Live Births             Home UPT Result: positive In-Office UPT result:positive I have reviewed the patient's medical, obstetrical, social, and family histories, and medications.   ASSESSMENT: Positive pregnancy test  PLAN Prenatal care to be completed at: [facility]

## 2023-02-07 ENCOUNTER — Other Ambulatory Visit (INDEPENDENT_AMBULATORY_CARE_PROVIDER_SITE_OTHER): Payer: 59

## 2023-02-07 ENCOUNTER — Other Ambulatory Visit (HOSPITAL_COMMUNITY)
Admission: RE | Admit: 2023-02-07 | Discharge: 2023-02-07 | Disposition: A | Discharge status: Home / Self Care | Payer: 59 | Source: Ambulatory Visit | Attending: Obstetrics and Gynecology | Admitting: Obstetrics and Gynecology

## 2023-02-07 ENCOUNTER — Ambulatory Visit: Payer: 59 | Admitting: *Deleted

## 2023-02-07 VITALS — BP 126/83 | HR 71 | Wt 246.7 lb

## 2023-02-07 DIAGNOSIS — O0991 Supervision of high risk pregnancy, unspecified, first trimester: Secondary | ICD-10-CM

## 2023-02-07 DIAGNOSIS — O3680X0 Pregnancy with inconclusive fetal viability, not applicable or unspecified: Secondary | ICD-10-CM

## 2023-02-07 DIAGNOSIS — O0990 Supervision of high risk pregnancy, unspecified, unspecified trimester: Secondary | ICD-10-CM | POA: Insufficient documentation

## 2023-02-07 DIAGNOSIS — O219 Vomiting of pregnancy, unspecified: Secondary | ICD-10-CM

## 2023-02-07 DIAGNOSIS — Z3A09 9 weeks gestation of pregnancy: Secondary | ICD-10-CM

## 2023-02-07 DIAGNOSIS — Z1339 Encounter for screening examination for other mental health and behavioral disorders: Secondary | ICD-10-CM

## 2023-02-07 DIAGNOSIS — Z3A10 10 weeks gestation of pregnancy: Secondary | ICD-10-CM

## 2023-02-07 MED ORDER — PROMETHAZINE HCL 25 MG PO TABS: 25.0000 mg | ORAL_TABLET | Freq: Four times a day (QID) | ORAL | 1 refills | Status: DC | PRN

## 2023-02-07 NOTE — Progress Notes (Signed)
New OB Intake  I connected with Caitlin Duncan  on 02/07/23 at 10:15 AM EST by In Person Visit and verified that I am speaking with the correct person using two identifiers. Nurse is located at [facility] and pt is located at [facility].  I discussed the limitations, risks, security and privacy concerns of performing an evaluation and management service by telephone and the availability of in person appointments. I also discussed with the patient that there may be a patient responsible charge related to this service. The patient expressed understanding and agreed to proceed.  I explained I am completing New OB Intake today. We discussed EDD of 09/05/2023, by Last Menstrual Period. Pt is G3P0020. I reviewed her allergies, medications and Medical/Surgical/OB history.    Patient Active Problem List   Diagnosis Date Noted   Supervision of high risk pregnancy, antepartum 02/07/2023   Heart palpitations 10/17/2022   ASCUS with positive high risk HPV 04/30/2013   Vitamin D deficiency 04/14/2013   Obesity (BMI 30-39.9) 04/13/2013   Contraceptive management 04/13/2013   Anemia, unspecified 01/17/2011   Heavy periods 01/17/2011   Anemia 01/01/2011   Dysmenorrhea 10/25/2010    Concerns addressed today  Delivery Plans Plans to deliver at [facility]. Discussed the nature of our practice with multiple providers including residents and students. Due to the size of the practice, the delivering provider may not be the same as those providing prenatal care.   Patient is not interested in water birth. Offered upcoming OB visit with CNM to discuss further.  MyChart/Babyscripts MyChart access verified. I explained pt will have some visits in office and some virtually. Babyscripts instructions given and order placed. Patient verifies receipt of registration text/e-mail. Account successfully created and app downloaded. If patient is a candidate for Optimized scheduling, add to sticky note.   Blood Pressure  Cuff/Weight Scale Pt has BP cuff at home Explained after first prenatal appt pt will check weekly and document in Babyscripts. Patient does not have weight scale; patient may purchase if they desire to track weight weekly in Babyscripts.  Anatomy US Explained first scheduled [place] will be around 19 weeks. Anatomy US scheduled for TBD at TBD.  Interested in [place]? If yes, send referral and doula dot phrase.   Is patient a candidate for Babyscripts Optimization? No, due to Risk Factors   First visit review I reviewed new OB appt with patient. Explained pt will be seen by [name], CNM at first visit. Discussed Avelina Laine genetic screening with patient. Requests Panorama and Horizon.. Routine prenatal labs  collected at today's visit.    Last Pap Diagnosis  Date Value Ref Range Status  08/08/2021   Final   - Negative for intraepithelial lesion or malignancy (NILM)    Harrel Lemon, RN 02/07/2023  12:08 PM

## 2023-02-07 NOTE — Patient Instructions (Signed)
[facility] [employer] has a partnership with the [employer] to provide prenatal navigation for the most needed resources in our community. In order to see how we can help connect you to these resources we need consent to contact you. Please complete the very short consent using the link below:   English Link: https://[place].tfaforms.net/283?site=16  Spanish Link: https://[place].tfaforms.net/287?site=16  Our practice his participating in a study that provides no-cost doula care. ACURE4Moms is a study looking at how doula care can reduce birthing disparities for Black and brown birthing people. We like to refer patients as soon as possible, but definitely before 28 weeks so patients can get to know their doula.    A doula is trained to provide support before, during and just after you give birth. While doulas do not provide medical care, they do provide emotional, physical and educational support. Doulas can help reduce your stress and comfort you and your partner. They can help you cope with labor by helping you use breathing techniques, massage, creative labor positioning, essential oils and affirmations.   ACURE4Moms is a research study trying to reduce:   low birthweight babies  emergency department visits & hospitalizations for birthing persons and their babies  depression among birthing people  discrimination in pregnancy-related care ACURE4Moms is trying out 2 programs designed by  people who have given birth. These programs include: 1. Sharing patient data and warning alerts with clinic staff to keep them accountable for their patients' outcomes and providing tools to help them  reduce bias in care. 2. Matching eligible patients with doulas from the  same community as the patients.  If you would like to participate in this study, please visit:   http://carroll-castaneda.info/  Options for Doula Care in the [place] Area  As you review  your birthing options, consider having a birth doula. A doula is trained to provide support before, during and just after you give birth. There are also postpartum doulas that help you adjust to new parenthood.  While doulas do not provide medical care, they do provide emotional, physical and educational support. A few months before your baby arrives, doulas can help answer questions, ease concerns and help you create and support your birthing plan.    Doulas can help reduce your stress and comfort you and your partner. They can help you cope with labor by helping you use breathing techniques, massage, creative labor positioning, essential oils and affirmations.   Studies show that the benefits of having a doula include:   A more positive birth experience  Fewer requests for pain-relief medication  Less likelihood of cesarean section, commonly called a c-section   Doulas are typically hired via a [employer] account planner between you and the doula. We are happy to provide a list of the most active doulas in the area, all of whom are credentialed by [facility] and will not count as a visitor at your birth.  There are several options for no-cost doula care at our hospital, including:  [facility] Volunteer Doula Program Every [employer] Program A Cure 4 Moms Doula Study (available only at [employer] [facility], [place], [place] and [facility] offices)  For more information on these programs or to receive a list of doulas active in our area, please email [email or web address]

## 2023-02-08 LAB — COMPREHENSIVE METABOLIC PANEL
ALT: 8 [IU]/L (ref 0–32)
AST: 10 [IU]/L (ref 0–40)
Albumin: 3.9 g/dL (ref 3.9–4.9)
Alkaline Phosphatase: 61 [IU]/L (ref 44–121)
BUN/Creatinine Ratio: 19 (ref 9–23)
BUN: 10 mg/dL (ref 6–20)
Bilirubin Total: <0.2 mg/dL (ref 0.0–1.2)
CO2: 16 mmol/L — ABNORMAL LOW (ref 20–29)
Calcium: 9.2 mg/dL (ref 8.7–10.2)
Chloride: 105 mmol/L (ref 96–106)
Creatinine, Ser: 0.53 mg/dL — ABNORMAL LOW (ref 0.57–1.00)
Globulin, Total: 2.9 g/dL (ref 1.5–4.5)
Glucose: 83 mg/dL (ref 70–99)
Potassium: 4.2 mmol/L (ref 3.5–5.2)
Sodium: 138 mmol/L (ref 134–144)
Total Protein: 6.8 g/dL (ref 6.0–8.5)
eGFR: 126 mL/min/{1.73_m2} (ref >59)

## 2023-02-08 LAB — CBC/D/PLT+RPR+RH+ABO+RUBIGG...
Antibody Screen: NEGATIVE
Basophils Absolute: 0 10*3/uL (ref 0.0–0.2)
Basos: 1 %
EOS (ABSOLUTE): 0 10*3/uL (ref 0.0–0.4)
Eos: 1 %
HCV Ab: NONREACTIVE
HIV Screen 4th Generation wRfx: NONREACTIVE
Hematocrit: 38 % (ref 34.0–46.6)
Hemoglobin: 12.7 g/dL (ref 11.1–15.9)
Hepatitis B Surface Ag: NEGATIVE
Immature Grans (Abs): 0 10*3/uL (ref 0.0–0.1)
Immature Granulocytes: 0 %
Lymphocytes Absolute: 2.2 10*3/uL (ref 0.7–3.1)
Lymphs: 38 %
MCH: 29.6 pg (ref 26.6–33.0)
MCHC: 33.4 g/dL (ref 31.5–35.7)
MCV: 89 fL (ref 79–97)
Monocytes Absolute: 0.5 10*3/uL (ref 0.1–0.9)
Monocytes: 9 %
Neutrophils Absolute: 2.9 10*3/uL (ref 1.4–7.0)
Neutrophils: 51 %
Platelets: 219 10*3/uL (ref 150–450)
RBC: 4.29 x10E6/uL (ref 3.77–5.28)
RDW: 12.4 % (ref 11.7–15.4)
RPR Ser Ql: NONREACTIVE
Rh Factor: POSITIVE
Rubella Antibodies, IGG: 1.5 {index} (ref >0.99)
WBC: 5.7 10*3/uL (ref 3.4–10.8)

## 2023-02-08 LAB — HEMOGLOBIN A1C
Est. average glucose Bld gHb Est-mCnc: 114 mg/dL
Hgb A1c MFr Bld: 5.6 % (ref 4.8–5.6)

## 2023-02-08 LAB — CERVICOVAGINAL ANCILLARY ONLY
Chlamydia: NEGATIVE
Comment: NEGATIVE
Comment: NORMAL
Neisseria Gonorrhea: NEGATIVE

## 2023-02-08 LAB — HCV INTERPRETATION

## 2023-02-09 LAB — URINE CULTURE, OB REFLEX

## 2023-02-09 LAB — CULTURE, OB URINE

## 2023-02-12 ENCOUNTER — Ambulatory Visit: Payer: 59 | Admitting: Dermatology

## 2023-02-13 ENCOUNTER — Ambulatory Visit: Payer: 59 | Admitting: Dermatology

## 2023-02-14 LAB — PANORAMA PRENATAL TEST FULL PANEL:PANORAMA TEST PLUS 5 ADDITIONAL MICRODELETIONS: FETAL FRACTION: 6.2

## 2023-02-21 LAB — HORIZON CUSTOM: REPORT SUMMARY: NEGATIVE

## 2023-03-14 ENCOUNTER — Ambulatory Visit: Payer: 59

## 2023-03-14 VITALS — BP 122/77 | HR 83 | Wt 252.0 lb

## 2023-03-14 DIAGNOSIS — O0990 Supervision of high risk pregnancy, unspecified, unspecified trimester: Secondary | ICD-10-CM

## 2023-03-14 DIAGNOSIS — R002 Palpitations: Secondary | ICD-10-CM | POA: Diagnosis not present

## 2023-03-14 DIAGNOSIS — E669 Obesity, unspecified: Secondary | ICD-10-CM

## 2023-03-14 DIAGNOSIS — O0992 Supervision of high risk pregnancy, unspecified, second trimester: Secondary | ICD-10-CM | POA: Diagnosis not present

## 2023-03-14 DIAGNOSIS — Z8619 Personal history of other infectious and parasitic diseases: Secondary | ICD-10-CM | POA: Insufficient documentation

## 2023-03-14 DIAGNOSIS — Z3A15 15 weeks gestation of pregnancy: Secondary | ICD-10-CM | POA: Diagnosis not present

## 2023-03-14 MED ORDER — ASPIRIN 81 MG PO TBEC: 81.0000 mg | DELAYED_RELEASE_TABLET | Freq: Every day | ORAL | 2 refills | Status: DC

## 2023-03-14 MED ORDER — VALACYCLOVIR HCL 500 MG PO TABS: 500.0000 mg | ORAL_TABLET | Freq: Two times a day (BID) | ORAL | 6 refills | Status: AC

## 2023-03-14 MED ORDER — ONDANSETRON 4 MG PO TBDP: 4.0000 mg | ORAL_TABLET | Freq: Four times a day (QID) | ORAL | 1 refills | Status: DC | PRN

## 2023-03-14 NOTE — Patient Instructions (Signed)

## 2023-03-14 NOTE — Progress Notes (Signed)
 Pt presents for NOB visit. Requesting refill of Valtrex and nausea meds

## 2023-03-14 NOTE — Progress Notes (Signed)
 Subjective:   Caitlin Duncan is a 33 y.o. G3P0020 at [redacted]w[redacted]d by Definite LMP of Nov 29, 2022 being seen today for her first obstetrical visit.  Patient states this was an unplanned pregnancy.  Patient reports she was on birth control pills prior to conception.   Gynecological/Obstetrical History: Patient reports no history of gynecological surgeries.  Patient endorses history of abnormal pap smears. Chart reflects ASCUS with HR HPV in 2015.  Most recent pap in July 2023 was normal.   Pregnancy history fully reviewed. Patient does intend to breast feed. Patient obstetrical history is significant for obesity and heart palpitations .   Sexual Activity and Vaginal Concerns: Patient is not currently sexually active. She denies vaginal discharge, bleeding, irritation, or odor. Patient also denies pain or difficulty with urination.    Medical History/ROS: Patient medical history significant for obesity and heart palpitations.  Patient also reports h/o blood transfusion with previous loss at ~ 8 weeks. Patient currently taking Metoprolol 25mg  daily.  Patient  denies history of MH disorders including anxiety and/or depression.  Patient reports fatigue and cramping that she contributes to inadequate water intake .  Patient denies constipation/diarrhea, but thinks she may have a hemorrhoid.     Social History: Patient reports the FOB is Oswaldo Done who is involved and supportive.  Patient expresses some concern with Oswaldo Done traveling for work and being home only 4x/mth.  However, patient endorses good support system. Patient is currently employed at [employer] and works from home.  HISTORY: OB History  Gravida Para Term Preterm AB Living  3 0 0 0 2 0  SAB IAB Ectopic Multiple Live Births  1 1 0 0 0    # Outcome Date GA Lbr Len/2nd Weight Sex Type Anes PTL Lv  3 Current           2 SAB 11/22/20 [redacted]w[redacted]d         1 IAB              Birth Comments: System Generated. Please review and update pregnancy  details.    Last pap smear was done July 2023 and was normal  Past Medical History:  Diagnosis Date   Anemia    ASCUS with positive high risk HPV 03/22/2013   Chlamydia 06/22/2013   Dysmenorrhea    resolved   Endometriosis    Vitamin D deficiency 03/22/2013   Past Surgical History:  Procedure Laterality Date   DILATION AND CURETTAGE OF UTERUS N/A 11/25/2020   Procedure: SUCTION DILATATION AND CURETTAGE;  Surgeon: Osborn Coho, MD;  Location: [facility] OR;  Service: Gynecology;  Laterality: N/A;   Family History  Problem Relation Age of Onset   Hypertension Mother    Kidney failure Mother        kidney transplant 2014 (related to pregnancy)   Hypertension Sister    Diabetes Neg Hx    Cancer Neg Hx    Heart disease Neg Hx    Social History   Tobacco Use   Smoking status: Never   Smokeless tobacco: Never  Vaping Use   Vaping status: Never Used  Substance Use Topics   Alcohol use: Not Currently    Alcohol/week: 1.0 standard drink of alcohol    Types: 1 Glasses of wine per week    Comment: 4-5 oz of wine/week   Drug use: No   No Known Allergies Current Outpatient Medications on File Prior to Visit  Medication Sig Dispense Refill   Cholecalciferol (VITAMIN D3) 1.25  MG (50000 UT) CAPS Take 1 capsule by mouth once a week.     docusate sodium (COLACE) 100 MG capsule Take 1 capsule (100 mg total) by mouth 2 (two) times daily as needed for mild constipation. 30 capsule 3   Iron, Ferrous Sulfate, 325 (65 Fe) MG TABS 1 tablet twice a day. Take with vitamin C source 30 tablet 3   metoprolol succinate (TOPROL XL) 25 MG 24 hr tablet Take 1 tablet (25 mg total) by mouth daily. 90 tablet 1   Prenatal Vit-Fe Fumarate-FA (PRENATAL VITAMIN PO) Take 1 tablet by mouth daily. 3 gummies per day     valACYclovir (VALTREX) 500 MG tablet Take 1 tablet (500 mg total) by mouth daily. Can increase to twice a day for 5 days in the event of a recurrence 90 tablet 3   albuterol (VENTOLIN HFA) 108 (90  Base) MCG/ACT inhaler Inhale 2 puffs into the lungs every 4 (four) hours as needed for wheezing or shortness of breath. (Patient not taking: Reported on 02/06/2023) 18 g 0   promethazine (PHENERGAN) 25 MG tablet Take 1 tablet (25 mg total) by mouth every 6 (six) hours as needed for nausea or vomiting. (Patient not taking: Reported on 03/14/2023) 30 tablet 1   triamcinolone ointment (KENALOG) 0.1 %  (Patient not taking: Reported on 02/06/2023)     [DISCONTINUED] cetirizine (ZYRTEC ALLERGY) 10 MG tablet Take 1 tablet (10 mg total) by mouth daily. 30 tablet 0   [DISCONTINUED] fluticasone (FLONASE) 50 MCG/ACT nasal spray Place 1 spray into both nostrils daily. 16 g 0   No current facility-administered medications on file prior to visit.    Review of Systems Pertinent items noted in HPI and remainder of comprehensive ROS otherwise negative.  Exam   Vitals:   03/14/23 1042  BP: 122/77  Pulse: 83  Weight: 252 lb (114.3 kg)   Fetal Heart Rate (bpm): 154  Physical Exam Constitutional:      Appearance: Normal appearance. She is obese. She is not ill-appearing.  HENT:     Head: Normocephalic and atraumatic.  Eyes:     Conjunctiva/sclera: Conjunctivae normal.  Cardiovascular:     Rate and Rhythm: Normal rate and regular rhythm.     Heart sounds: Normal heart sounds.  Pulmonary:     Effort: Pulmonary effort is normal. No respiratory distress.     Breath sounds: Normal breath sounds.  Abdominal:     Palpations: Abdomen is soft.     Tenderness: There is no abdominal tenderness.  Musculoskeletal:        General: Normal range of motion.     Cervical back: Normal range of motion.  Neurological:     Mental Status: She is alert and oriented to person, place, and time.  Skin:    General: Skin is warm and dry.  Psychiatric:        Mood and Affect: Mood normal.        Behavior: Behavior normal.  Vitals reviewed.     Assessment:   33 y.o. year old G3P0020 Patient Active Problem List    Diagnosis Date Noted   Supervision of high risk pregnancy, antepartum 02/07/2023   Heart palpitations 10/17/2022   Vitamin D deficiency 04/14/2013   Obesity (BMI 30-39.9) 04/13/2013     Plan:  1. Supervision of high risk pregnancy, antepartum -Congratulations given and patient welcomed to practice. -Discussed potential usage of Babyscripts and virtual visits as additional source of managing and completing PN visits.   -Reviewed prenatal  visit schedule and platforms used for virtual visits.  -Anticipatory guidance for interventions during prenatal visits including labs, ultrasounds, and testing; Initial labs reviewed. -Genetic Screening discussed, First trimester screen, Quad screen, and NIPS: results reviewed. -Ultrasound discussed; fetal anatomic survey:  scheduled . -Discussed estimated due date of September 05, 2023. -Continue prenatal vitamins  -Influenza offered and declined. -Encouraged to seek out care at office or emergency room for urgent and/or emergent concerns. -Educated on the nature of [facility] with multiple MDs and other Advanced Practice Providers was explained to patient; also emphasized that residents, students are part of our team. Informed of her right to refuse care as she deems appropriate.  -Encouraged to complete and utilize MyChart Registration for her ability to review results, send requests, and have questions addressed.  -No questions or concerns.    2. [redacted] weeks gestation of pregnancy -Doing well, but anxious.  -Reassurance and support given. -Will send script for Zofran for nausea. Cautioned regarding regular usage and h/o heart palpitations as well as constipation.  -Discussed OTC methods to treat hemorrhoids.    3. Obesity (BMI 30-39.9) -TWG 15 lbs today. -BMI 40.67 -Reviewed PreEclampsia risk factors including AA, Obesity, and h/o elevated HgB A1C.   -Reviewed research and findings supporting initiation of bASA to  reduce risk of developing condition later in pregnancy.   -Patient verbalizes understanding and agrees to plan. -Script sent today.  4. Heart palpitations -Taking metoprolol 25 mg daily. -Referral placed for cardio ob.  5. Hx of herpes simplex infection -Rx refill sent to pharmacy. -Patient with some anxiety regarding daily dosing with pregnancy. -Informed that she does not need to and can discontinue with restart at 36 weeks.    Problem list reviewed and updated. Routine obstetric precautions reviewed.  Orders Placed This Encounter  Procedures   AFP, Serum, Open Spina Bifida    Is patient insulin dependent?:   No    Weight (lbs):   252    Gestational Age (GA), weeks:   15    Date on which patient was at this GA:   03/14/2023    GA Calculation Method:   LMP    Number of fetuses:   1    Return in about 4 weeks (around 04/11/2023).     Cherre Robins, CNM 03/14/2023 12:00 PM

## 2023-03-16 LAB — AFP, SERUM, OPEN SPINA BIFIDA
AFP MoM: 2.19
AFP Value: 51.3 ng/mL
Gest. Age on Collection Date: 15 wk
Maternal Age At EDD: 33 a
OSBR Risk 1 IN: 1069
Test Results:: NEGATIVE
Weight: 252 [lb_av]

## 2023-04-11 ENCOUNTER — Ambulatory Visit: Payer: 59 | Admitting: Physician Assistant

## 2023-04-11 VITALS — BP 132/83 | HR 83 | Wt 254.2 lb

## 2023-04-11 DIAGNOSIS — O0990 Supervision of high risk pregnancy, unspecified, unspecified trimester: Secondary | ICD-10-CM

## 2023-04-11 DIAGNOSIS — Z3A19 19 weeks gestation of pregnancy: Secondary | ICD-10-CM | POA: Diagnosis not present

## 2023-04-11 DIAGNOSIS — R110 Nausea: Secondary | ICD-10-CM

## 2023-04-11 DIAGNOSIS — E669 Obesity, unspecified: Secondary | ICD-10-CM | POA: Diagnosis not present

## 2023-04-11 MED ORDER — DOXYLAMINE-PYRIDOXINE 10-10 MG PO TBEC: 2.0000 | DELAYED_RELEASE_TABLET | Freq: Every day | ORAL | 5 refills | Status: DC

## 2023-04-11 NOTE — Patient Instructions (Addendum)
 You may take up to 3000 mg Tylenol for headaches in pregnancy.   For mucus control, use a saline nasal rinse such as this, found at any at pharmacy. Or use fluticasone (flonase) nasal spray, also found over the counter.

## 2023-04-11 NOTE — Progress Notes (Signed)
 Pt presents for rob. Pt would like something different for nausea, tried Zofran and Phenergan. No other concerns at this time.

## 2023-04-11 NOTE — Progress Notes (Signed)
   PRENATAL VISIT NOTE  Subjective:  Caitlin Duncan is a 33 y.o. G3P0020 at [redacted]w[redacted]d being seen today for ongoing prenatal care.  She is currently monitored for the following issues for this high-risk pregnancy and has Obesity (BMI 30-39.9); Vitamin D deficiency; Heart palpitations; Supervision of high risk pregnancy, antepartum; and Hx of herpes simplex infection on their problem list.  Patient reports no complaints.  Contractions: Not present. Vag. Bleeding: None.  Movement: Absent. Denies leaking of fluid.   The following portions of the patient's history were reviewed and updated as appropriate: allergies, current medications, past family history, past medical history, past social history, past surgical history and problem list.   Objective:   Vitals:   04/11/23 1041  BP: 132/83  Pulse: 83  Weight: 254 lb 3.2 oz (115.3 kg)    Fetal Status: Fetal Heart Rate (bpm): 154   Movement: Absent     General:  Alert, oriented and cooperative. Patient is in no acute distress.  Skin: Skin is warm and dry. No rash noted.   Cardiovascular: Normal heart rate noted  Respiratory: Normal respiratory effort, no problems with respiration noted  Abdomen: Soft, gravid, appropriate for gestational age.  Pain/Pressure: Absent     Pelvic: Cervical exam deferred        Extremities: Normal range of motion.  Edema: None  Mental Status: Normal mood and affect. Normal behavior. Normal judgment and thought content.   Assessment and Plan:  Pregnancy: G3P0020 at [redacted]w[redacted]d  1. Supervision of high risk pregnancy, antepartum (Primary) Patient is doing well, feeling regular fetal movement BP, FHR, FH appropriate  2. [redacted] weeks gestation of pregnancy Anticipatory guidance about next visits/weeks of pregnancy given.  Anatomy scan 04/22/2023  3. Obesity (BMI 30-39.9) Growth scan at 32 weeks  Preterm labor symptoms and general obstetric precautions including but not limited to vaginal bleeding, contractions, leaking  of fluid and fetal movement were reviewed in detail with the patient. Please refer to After Visit Summary for other counseling recommendations.   Return in about 4 weeks (around 05/09/2023) for [facility]  Future Appointments  Date Time Provider Department Center  04/15/2023  9:40 AM Thomasene Ripple, DO [facility] None  04/22/2023 10:15 AM [facility] NURSE [facility]  04/22/2023 10:30 AM [facility] US5 [facility]  05/09/2023 10:35 AM Dunn, Gillian Scarce, MD [facility] None  08/01/2023 11:00 AM Deirdre Evener, MD [facility] None    Ralene Muskrat, [place]

## 2023-04-15 ENCOUNTER — Ambulatory Visit: Admitting: Cardiology

## 2023-04-19 ENCOUNTER — Encounter: Payer: Self-pay | Admitting: Cardiology

## 2023-04-19 ENCOUNTER — Ambulatory Visit: Attending: Cardiology | Admitting: Cardiology

## 2023-04-19 VITALS — BP 134/96 | HR 79 | Ht 66.0 in | Wt 252.4 lb

## 2023-04-19 DIAGNOSIS — O10919 Unspecified pre-existing hypertension complicating pregnancy, unspecified trimester: Secondary | ICD-10-CM | POA: Diagnosis not present

## 2023-04-19 DIAGNOSIS — R002 Palpitations: Secondary | ICD-10-CM

## 2023-04-19 DIAGNOSIS — Z3A20 20 weeks gestation of pregnancy: Secondary | ICD-10-CM

## 2023-04-19 DIAGNOSIS — Z7689 Persons encountering health services in other specified circumstances: Secondary | ICD-10-CM

## 2023-04-19 NOTE — Patient Instructions (Addendum)
 Medication Instructions:  Your physician recommends that you continue on your current medications as directed. Please refer to the Current Medication list given to you today.  *If you need a refill on your cardiac medications before your next appointment, please call your pharmacy*   Follow-Up: At [facility], you and your health needs are our priority.  As part of our continuing mission to provide you with exceptional heart care, our providers are all part of one team.  This team includes your primary Cardiologist (physician) and Advanced Practice Providers or APPs (Physician Assistants and Nurse Practitioners) who all work together to provide you with the care you need, when you need it.   Your next appointment:   8-9 week(s)  Provider:   Thomasene Ripple, DO    Other Instructions    1st Floor: - Lobby - Registration  - Pharmacy  - Lab - Cafe  2nd Floor: - PV Lab - Diagnostic Testing (echo, CT, nuclear med)  3rd Floor: - Vacant  4th Floor: - TCTS (cardiothoracic surgery) - AFib Clinic - Structural Heart Clinic - Vascular Surgery  - Vascular Ultrasound  5th Floor: - [facility] Cardiology (general and EP) - Clinical Pharmacy for coumadin, hypertension, lipid, weight-loss medications, and med management appointments    Valet parking services will be available as well.

## 2023-04-22 ENCOUNTER — Ambulatory Visit: Payer: 59 | Attending: Obstetrics and Gynecology

## 2023-04-22 ENCOUNTER — Ambulatory Visit: Payer: 59 | Admitting: *Deleted

## 2023-04-22 ENCOUNTER — Ambulatory Visit: Attending: Obstetrics | Admitting: Obstetrics

## 2023-04-22 ENCOUNTER — Other Ambulatory Visit: Payer: Self-pay | Admitting: *Deleted

## 2023-04-22 ENCOUNTER — Encounter: Payer: Self-pay | Admitting: *Deleted

## 2023-04-22 VITALS — BP 119/69 | HR 73

## 2023-04-22 DIAGNOSIS — O3412 Maternal care for benign tumor of corpus uteri, second trimester: Secondary | ICD-10-CM

## 2023-04-22 DIAGNOSIS — O358XX0 Maternal care for other (suspected) fetal abnormality and damage, not applicable or unspecified: Secondary | ICD-10-CM

## 2023-04-22 DIAGNOSIS — O0990 Supervision of high risk pregnancy, unspecified, unspecified trimester: Secondary | ICD-10-CM | POA: Insufficient documentation

## 2023-04-22 DIAGNOSIS — E669 Obesity, unspecified: Secondary | ICD-10-CM

## 2023-04-22 DIAGNOSIS — Z3A20 20 weeks gestation of pregnancy: Secondary | ICD-10-CM

## 2023-04-22 DIAGNOSIS — D259 Leiomyoma of uterus, unspecified: Secondary | ICD-10-CM | POA: Diagnosis not present

## 2023-04-22 DIAGNOSIS — O99212 Obesity complicating pregnancy, second trimester: Secondary | ICD-10-CM | POA: Diagnosis not present

## 2023-04-22 DIAGNOSIS — Z362 Encounter for other antenatal screening follow-up: Secondary | ICD-10-CM

## 2023-04-22 NOTE — Progress Notes (Signed)
 MFM Consult Note  Caitlin Duncan is currently at 20 weeks and 4 days.  She was seen due to maternal obesity with a BMI of 38.    She denies any significant past medical history and denies any problems in her current pregnancy.    She had a cell free DNA test earlier in her pregnancy which indicated a low risk for trisomy 67, 74, and 13. A female fetus is predicted.   She was informed that the fetal growth and amniotic fluid level were appropriate for her gestational age.   On today's exam, an intracardiac echogenic focus was noted in the left ventricle of the fetal heart.    The small association between an echogenic focus and Down syndrome was discussed.   Due to the echogenic focus noted today, the patient was offered and declined an amniocentesis today for definitive diagnosis of fetal aneuploidy.  She reports that she is comfortable with her negative cell free DNA test.  The views of the fetal anatomy were limited today due to maternal body habitus and the fetal position.  The patient was informed that anomalies may be missed due to technical limitations. If the fetus is in a suboptimal position or maternal habitus is increased, visualization of the fetus in the maternal uterus may be impaired.  Multiple fibroids were noted in her uterus today.    The increased risk of maternal pain issues and fetal growth issues later in her pregnancy due to the fibroids was discussed.   Due to her fibroid uterus and maternal obesity, we will continue to follow her with growth ultrasounds throughout her pregnancy.    A follow-up exam was scheduled in 4 weeks to complete the views of the fetal anatomy and to assess the fetal growth.    The patient stated that all of her questions were answered today.  A total of 30 minutes was spent counseling and coordinating the care for this patient.  Greater than 50% of the time was spent in direct face-to-face contact.

## 2023-04-24 NOTE — Progress Notes (Signed)
 Cardio-Obstetrics Clinic  New Evaluation  Date:  04/24/2023   ID:  Caitlin Duncan, DOB 09/28/1990, MRN [id number]  PCP:  Pcp, No   [facility] Providers Cardiologist:  None  Electrophysiologist:  None       Referring MD: [name], CNM   Chief Complaint: I am having palpitations  History of Present Illness:    Caitlin Duncan is a 33 y.o. female [G3P0020] who is being seen today for the evaluation of palpitations at the request of [name], [place].   Discussed the use of AI scribe software for clinical note transcription with the patient, who gave verbal consent to proceed.   The patient, a 20-week pregnant woman with a history of palpitations, presents for a follow-up visit. She reports that her palpitations have decreased in frequency but she still experiences episodes where she can hear her heart beating in her head. Prior to her pregnancy, she was very active, working out every day. She was initially misdiagnosed with anxiety and premenopause before being correctly diagnosed and treated for her heart condition. She is currently on heart medication which has helped to slow down her heart rate.   She also reports elevated blood pressure, which is being closely monitored.  The patient is also experiencing some anxiety about her pregnancy, as this is her first child. She is the last of seven siblings to have a child and her mother is very excited about the upcoming addition to the family.       Prior CV Studies Reviewed: The following studies were reviewed today: ZIO  Past Medical History:  Diagnosis Date   Anemia    ASCUS with positive high risk HPV 03/22/2013   Chlamydia 06/22/2013   Dysmenorrhea    resolved   Endometriosis    Vitamin D deficiency 03/22/2013    Past Surgical History:  Procedure Laterality Date   DILATION AND CURETTAGE OF UTERUS N/A 11/25/2020   Procedure: SUCTION DILATATION AND CURETTAGE;  Surgeon: Osborn Coho, MD;   Location: [facility] OR;  Service: Gynecology;  Laterality: N/A;      OB History     Gravida  3   Para  0   Term  0   Preterm  0   AB  2   Living  0      SAB  1   IAB  1   Ectopic  0   Multiple  0   Live Births                  Current Medications: Current Meds  Medication Sig   albuterol (VENTOLIN HFA) 108 (90 Base) MCG/ACT inhaler Inhale 2 puffs into the lungs every 4 (four) hours as needed for wheezing or shortness of breath.   aspirin EC 81 MG tablet Take 1 tablet (81 mg total) by mouth daily.   Cholecalciferol (VITAMIN D3) 1.25 MG (50000 UT) CAPS Take 1 capsule by mouth once a week.   docusate sodium (COLACE) 100 MG capsule Take 1 capsule (100 mg total) by mouth 2 (two) times daily as needed for mild constipation.   Doxylamine-Pyridoxine (DICLEGIS) 10-10 MG TBEC Take 2 tablets by mouth at bedtime. If symptoms persist, add one tablet in the morning and one in the afternoon   Iron, Ferrous Sulfate, 325 (65 Fe) MG TABS 1 tablet twice a day. Take with vitamin C source   metoprolol succinate (TOPROL XL) 25 MG 24 hr tablet Take 1 tablet (25 mg total) by mouth daily.  ondansetron (ZOFRAN-ODT) 4 MG disintegrating tablet Take 1-2 tablets (4-8 mg total) by mouth every 6 (six) hours as needed for nausea or vomiting. (Patient not taking: Reported on 04/22/2023)   Prenatal Vit-Fe Fumarate-FA (PRENATAL VITAMIN PO) Take 1 tablet by mouth daily. 3 gummies per day   promethazine (PHENERGAN) 25 MG tablet Take 1 tablet (25 mg total) by mouth every 6 (six) hours as needed for nausea or vomiting. (Patient not taking: Reported on 04/22/2023)   triamcinolone ointment (KENALOG) 0.1 %  (Patient not taking: Reported on 04/22/2023)   valACYclovir (VALTREX) 500 MG tablet Take 1 tablet (500 mg total) by mouth daily. Can increase to twice a day for 5 days in the event of a recurrence   valACYclovir (VALTREX) 500 MG tablet Take 1 tablet (500 mg total) by mouth 2 (two) times daily.     Allergies:    Patient has no known allergies.   Social History   Socioeconomic History   Marital status: Single    Spouse name: Not on file   Number of children: Not on file   Years of education: Not on file   Highest education level: Not on file  Occupational History   Not on file  Tobacco Use   Smoking status: Never   Smokeless tobacco: Never  Vaping Use   Vaping status: Never Used  Substance and Sexual Activity   Alcohol use: Not Currently    Alcohol/week: 1.0 standard drink of alcohol    Types: 1 Glasses of wine per week    Comment: 4-5 oz of wine/week   Drug use: No   Sexual activity: Yes    Partners: Male    Birth control/protection: Condom  Other Topics Concern   Not on file  Social History Narrative   Lives alone. No pets.   Family lives in [place].   Graduated from [employer] (criminal justice) . Lives alone.  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 May move to [place]. Applied for jobs in [place] (law enforcement)--got delayed due to death in the family, but still planning to do this.   Updated 04/2017   Social Drivers of Corporate investment banker Strain: Not on file  Food Insecurity: Not on file  Transportation Needs: Not on file  Physical Activity: Not on file  Stress: Not on file  Social Connections: Not on file      Family History  Problem Relation Age of Onset   Hypertension Mother    Kidney failure Mother        kidney transplant 2014 (related to pregnancy)   Hypertension Sister    Diabetes Neg Hx    Cancer Neg Hx    Heart disease Neg Hx       ROS:   Please see the history of present illness.    Palpitations All other systems reviewed and are negative.   Labs/EKG Reviewed:    EKG:   EKG  ordered today.  The ekg ordered today demonstrates sinus rhythm  Recent Labs: 05/21/2022: TSH 0.718 10/17/2022: Magnesium 2.1 02/07/2023: ALT 8; BUN 10; Creatinine, Ser 0.53; Hemoglobin 12.7; Platelets 219;  Potassium 4.2; Sodium 138   Recent Lipid Panel Lab Results  Component Value Date/Time   CHOL 163 03/14/2011 10:06 AM   TRIG 60 03/14/2011 10:06 AM   HDL 72 03/14/2011 10:06 AM   CHOLHDL 2.3 03/14/2011 10:06 AM   LDLCALC 79 03/14/2011 10:06 AM    Physical Exam:  VS:  BP (!) 134/96 (BP Location: Right Arm, Patient Position: Sitting, Cuff Size: Normal)   Pulse 79   Ht 5\' 6"  (1.676 m)   Wt 252 lb 6.4 oz (114.5 kg)   LMP 11/29/2022   SpO2 99%   BMI 40.74 kg/m     Wt Readings from Last 3 Encounters:  04/19/23 252 lb 6.4 oz (114.5 kg)  04/11/23 254 lb 3.2 oz (115.3 kg)  03/14/23 252 lb (114.3 kg)     GEN:  Well nourished, well developed in no acute distress HEENT: Normal NECK: No JVD; No carotid bruits LYMPHATICS: No lymphadenopathy CARDIAC: RRR, no murmurs, rubs, gallops RESPIRATORY:  Clear to auscultation without rales, wheezing or rhonchi  ABDOMEN: Soft, non-tender, non-distended MUSCULOSKELETAL:  No edema; No deformity  SKIN: Warm and dry NEUROLOGIC:  Alert and oriented x 3 PSYCHIATRIC:  Normal affect    Risk Assessment/Risk Calculators:     CARPREG II Risk Prediction Index Score:  1.  The patient's risk for a primary cardiac event is 5%.   Modified World Health Organization [facility]) Classification of Maternal CV Risk   Class I         ASSESSMENT & PLAN:       Palpitations Palpitations decreased with metoprolol. Current dose safe during pregnancy. Monitoring for IUGR due to medication. - Continue metoprolol as prescribed. - Monitor for uterine growth restriction during ultrasounds.  Hypertension Blood pressure elevated but below 140 mmHg. Monitoring crucial as third trimester approaches. Discussed home monitoring and potential medication adjustments to prevent preeclampsia. - Monitor blood pressure at home daily. - Report any blood pressure readings approaching 140 mmHg. - Follow up in 8 weeks to monitor blood pressure progression. - Consider medication  adjustments if blood pressure increases.  Pregnancy [redacted] weeks pregnant. Discussed hypertension risks and management strategies. Monitoring continues into third trimester. - Follow up with OB/GYN for routine prenatal care. - Avoid salty foods to manage swelling and blood pressure.          Patient Instructions  Medication Instructions:  Your physician recommends that you continue on your current medications as directed. Please refer to the Current Medication list given to you today.  *If you need a refill on your cardiac medications before your next appointment, please call your pharmacy*   Follow-Up: At [facility], you and your health needs are our priority.  As part of our continuing mission to provide you with exceptional heart care, our providers are all part of one team.  This team includes your primary Cardiologist (physician) and Advanced Practice Providers or APPs (Physician Assistants and Nurse Practitioners) who all work together to provide you with the care you need, when you need it.   Your next appointment:   8-9 week(s)  Provider:   Thomasene Ripple, DO    Other Instructions    1st Floor: - Lobby - Registration  - Pharmacy  - Lab - Cafe  2nd Floor: - PV Lab - Diagnostic Testing (echo, CT, nuclear med)  3rd Floor: - Vacant  4th Floor: - TCTS (cardiothoracic surgery) - AFib Clinic - Structural Heart Clinic - Vascular Surgery  - Vascular Ultrasound  5th Floor: - HeartCare Cardiology (general and EP) - Clinical Pharmacy for coumadin, hypertension, lipid, weight-loss medications, and med management appointments    Valet parking services will be available as well.      Dispo:  No follow-ups on file.   Medication Adjustments/Labs and Tests Ordered: Current medicines are reviewed at length with the patient  today.  Concerns regarding medicines are outlined above.  Tests Ordered: Orders Placed This Encounter  Procedures   EKG 12-Lead    Medication Changes: No orders of the defined types were placed in this encounter.

## 2023-05-09 ENCOUNTER — Ambulatory Visit: Admitting: Obstetrics and Gynecology

## 2023-05-09 VITALS — BP 110/76 | HR 82 | Wt 255.0 lb

## 2023-05-09 DIAGNOSIS — R002 Palpitations: Secondary | ICD-10-CM | POA: Diagnosis not present

## 2023-05-09 DIAGNOSIS — O0990 Supervision of high risk pregnancy, unspecified, unspecified trimester: Secondary | ICD-10-CM | POA: Diagnosis not present

## 2023-05-09 DIAGNOSIS — O3412 Maternal care for benign tumor of corpus uteri, second trimester: Secondary | ICD-10-CM

## 2023-05-09 DIAGNOSIS — D259 Leiomyoma of uterus, unspecified: Secondary | ICD-10-CM

## 2023-05-09 DIAGNOSIS — O99210 Obesity complicating pregnancy, unspecified trimester: Secondary | ICD-10-CM | POA: Diagnosis not present

## 2023-05-09 MED ORDER — DOCUSATE SODIUM 100 MG PO CAPS: 100.0000 mg | ORAL_CAPSULE | Freq: Two times a day (BID) | ORAL | 3 refills | Status: AC | PRN

## 2023-05-09 NOTE — Progress Notes (Signed)
   PRENATAL VISIT NOTE  Subjective:  Caitlin Duncan is a 33 y.o. G3P0020 at [redacted]w[redacted]d being seen today for ongoing prenatal care.  She is currently monitored for the following issues for this high-risk pregnancy and has Obesity (BMI 30-39.9); Vitamin D deficiency; Heart palpitations; Supervision of high risk pregnancy, antepartum; Hx of herpes simplex infection; and Leiomyoma of uterus affecting pregnancy in second trimester on their problem list.  Last ultrasound 04/22/23: normal anatomy, EFLV noted, neg NIPT Anterior fibroid 2.4 cm Posterior fibroid 4.5 cm Serial growth planned  Patient reports occasional pain above her umbilicus.  Contractions: Not present. Vag. Bleeding: None.  Movement: Present. Denies leaking of fluid.   The following portions of the patient's history were reviewed and updated as appropriate: allergies, current medications, past family history, past medical history, past social history, past surgical history and problem list.   Objective:   Vitals:   05/09/23 1032  BP: 110/76  Pulse: 82  Weight: 255 lb (115.7 kg)   Body mass index is 41.16 kg/m. Total weight gain: 18 lb (8.165 kg)   Fetal Status: Fetal Heart Rate (bpm): 150 Fundal Height: 24 cm Movement: Present     General:  Alert, oriented and cooperative. Patient is in no acute distress.  Skin: Skin is warm and dry. No rash noted.   Cardiovascular: Normal heart rate noted  Respiratory: Normal respiratory effort, no problems with respiration noted  Abdomen: Soft, gravid, appropriate for gestational age.  Pain/Pressure: Absent     Pelvic: Cervical exam deferred        Extremities: Normal range of motion.     Mental Status: Normal mood and affect. Normal behavior. Normal judgment and thought content.   Assessment and Plan:  Pregnancy: G3P0020 at [redacted]w[redacted]d 1. Supervision of high risk pregnancy, antepartum (Primary) Doing well Labs next visit  2. Obesity during pregnancy Serial growth planned  3. Heart  palpitations S/p cardiology visit  4. Fibroids in pregnancy: reviewed fibroids in pregnancy and risks of pain, growth of fibroids, obstruction of delivery, risk of postpartum hemorrhage. Reassured that most patients do well without issues during their pregnancy  Preterm labor symptoms and general obstetric precautions including but not limited to vaginal bleeding, contractions, leaking of fluid and fetal movement were reviewed in detail with the patient. Please refer to After Visit Summary for other counseling recommendations.   Return in about 4 weeks (around 06/06/2023).  Future Appointments  Date Time Provider Department Center  05/20/2023  3:00 PM [facility] PROVIDER 1 [facility]  05/20/2023  3:30 PM [facility] US4 [facility]  06/20/2023 11:20 AM Tobb, Kardie, DO [facility] None  08/01/2023 11:00 AM Elta Halter, MD [facility] None    Marci Setter, MD

## 2023-05-09 NOTE — Progress Notes (Signed)
 Pt complains of swelling/pain in feet.  Pt would like to discuss fibroids and pregnancy, see lastest u/s.

## 2023-05-20 ENCOUNTER — Ambulatory Visit (HOSPITAL_BASED_OUTPATIENT_CLINIC_OR_DEPARTMENT_OTHER): Admitting: Obstetrics

## 2023-05-20 ENCOUNTER — Ambulatory Visit: Attending: Obstetrics and Gynecology

## 2023-05-20 ENCOUNTER — Other Ambulatory Visit: Payer: Self-pay | Admitting: *Deleted

## 2023-05-20 DIAGNOSIS — E669 Obesity, unspecified: Secondary | ICD-10-CM | POA: Diagnosis present

## 2023-05-20 DIAGNOSIS — D259 Leiomyoma of uterus, unspecified: Secondary | ICD-10-CM | POA: Insufficient documentation

## 2023-05-20 DIAGNOSIS — O99213 Obesity complicating pregnancy, third trimester: Secondary | ICD-10-CM

## 2023-05-20 DIAGNOSIS — Z3A24 24 weeks gestation of pregnancy: Secondary | ICD-10-CM | POA: Insufficient documentation

## 2023-05-20 DIAGNOSIS — O99212 Obesity complicating pregnancy, second trimester: Secondary | ICD-10-CM

## 2023-05-20 DIAGNOSIS — O3412 Maternal care for benign tumor of corpus uteri, second trimester: Secondary | ICD-10-CM

## 2023-05-20 DIAGNOSIS — Z362 Encounter for other antenatal screening follow-up: Secondary | ICD-10-CM | POA: Diagnosis present

## 2023-05-20 NOTE — Progress Notes (Signed)
 MFM Consult Note  Caitlin Duncan is currently at 24 weeks and 4 days.  She has been followed due to maternal obesity with a BMI of 38 and a fibroid uterus.  She denies any problems since her last exam.   The views of the fetal anatomy were unable to be fully visualized during her last exam.    On today's exam, the overall EFW of 1 pound 12 ounces measures at the 74th percentile for her gestational age.  There was normal amniotic fluid noted.  The views of the fetal anatomy were visualized today.  There were no obvious anomalies noted other than the previously noted echogenic focus in the left ventricle of the fetal heart..    The limitations of ultrasound in the detection of all anomalies was discussed.    Multiple fibroids continue to be noted in her uterus.  The increased risk of maternal pain issues associated with fibroids in pregnancy was discussed.  Due to maternal obesity and her fibroid uterus, we will continue to follow her with growth ultrasounds throughout her pregnancy.    A follow-up exam was scheduled in 5 weeks.    The patient stated that all of her questions were answered today.  A total of 20 minutes was spent counseling and coordinating the care for this patient.  Greater than 50% of the time was spent in direct face-to-face contact.

## 2023-06-06 ENCOUNTER — Encounter: Payer: Self-pay | Admitting: Physician Assistant

## 2023-06-06 ENCOUNTER — Other Ambulatory Visit

## 2023-06-06 ENCOUNTER — Ambulatory Visit: Admitting: Physician Assistant

## 2023-06-06 VITALS — BP 116/82 | HR 83 | Wt 251.0 lb

## 2023-06-06 DIAGNOSIS — E669 Obesity, unspecified: Secondary | ICD-10-CM | POA: Diagnosis not present

## 2023-06-06 DIAGNOSIS — R002 Palpitations: Secondary | ICD-10-CM

## 2023-06-06 DIAGNOSIS — Z3A27 27 weeks gestation of pregnancy: Secondary | ICD-10-CM

## 2023-06-06 DIAGNOSIS — O0990 Supervision of high risk pregnancy, unspecified, unspecified trimester: Secondary | ICD-10-CM

## 2023-06-06 DIAGNOSIS — D219 Benign neoplasm of connective and other soft tissue, unspecified: Secondary | ICD-10-CM

## 2023-06-06 NOTE — Patient Instructions (Signed)
 Safe Over-The-Counter Medications in Pregnancy   Acne:  Benzoyl Peroxide  Salicylic Acid   Backache/Headache:  Tylenol : 2 regular strength every 4 hours OR               2 Extra strength every 6 hours   Colds/Coughs/Allergies:  Benadryl  (alcohol free) 25 mg every 6 hours as needed  Breath right strips  Claritin  Cepacol throat lozenges  Chloraseptic throat spray  Cold-Eeze- up to three times per day  Cough drops, alcohol free  Flonase (by prescription only)  Guaifenesin  Mucinex  Robitussin DM (plain only, alcohol free)  Saline nasal spray/drops  Sudafed (pseudoephedrine) & Actifed * use only after [redacted] weeks gestation and if you do not have high blood pressure  Tylenol   Vicks Vaporub  Zinc lozenges  Zyrtec    Constipation:  Colace  Ducolax suppositories  Fleet enema  Glycerin  suppositories  Metamucil  Milk of magnesia  Miralax   Senokot  Smooth move tea   Diarrhea:  Kaopectate  Imodium A-D   *NO pepto Bismol*  Hemorrhoids:  Anusol  Anusol HC  Preparation H  Tucks   Indigestion:  Tums  Maalox  Mylanta  Zantac  Pepcid   Insomnia:  Benadryl  (alcohol free) 25mg  every 6 hours as needed  Tylenol  PM  Unisom , no Gelcaps   Leg Cramps:  Tums  MagGel   Nausea/Vomiting:  Bonine  Dramamine  Emetrol  Ginger extract  Sea bands  Meclizine  Nausea medication to take during pregnancy:  Unisom  (doxylamine  succinate 25 mg tablets) Take one tablet daily at bedtime. If symptoms are not adequately controlled, the dose can be increased to a maximum recommended dose of two tablets daily (1/2 tablet in the morning, 1/2 tablet mid-afternoon and one at bedtime).  Vitamin B6 100mg  tablets. Take one tablet twice a day (up to 200 mg per day).   Skin Rashes:  Aveeno products  Benadryl  cream or 25mg  every 6 hours as needed  Calamine Lotion  1% cortisone cream   Yeast infection:  Gyne-lotrimin 7  Monistat 7   **If taking multiple medications, please check labels to  avoid duplicating the same active ingredients  **take medication as directed on the label  ** Do not exceed 4000 mg of tylenol  in 24 hours  **Do not take medications that contain aspirin or ibuprofen 

## 2023-06-06 NOTE — Progress Notes (Signed)
   PRENATAL VISIT NOTE  Subjective:  Caitlin Duncan is a 33 y.o. G3P0020 at [redacted]w[redacted]d being seen today for ongoing prenatal care.  She is currently monitored for the following issues for this high-risk pregnancy and has Obesity (BMI 30-39.9); Vitamin D  deficiency; Heart palpitations; Supervision of high risk pregnancy, antepartum; Hx of herpes simplex infection; and Leiomyoma of uterus affecting pregnancy in second trimester on their problem list.  Patient reports no complaints.  Contractions: Not present. Vag. Bleeding: None.  Movement: Present. Denies leaking of fluid.   The following portions of the patient's history were reviewed and updated as appropriate: allergies, current medications, past family history, past medical history, past social history, past surgical history and problem list.   Objective:    Vitals:   06/06/23 0943  BP: 116/82  Pulse: 83  Weight: 251 lb (113.9 kg)    Fetal Status:  Fetal Heart Rate (bpm): 151 Fundal Height: 28 cm Movement: Present    General: Alert, oriented and cooperative. Patient is in no acute distress.  Skin: Skin is warm and dry. No rash noted.   Cardiovascular: Normal heart rate noted  Respiratory: Normal respiratory effort, no problems with respiration noted  Abdomen: Soft, gravid, appropriate for gestational age.  Pain/Pressure: Present     Pelvic: Cervical exam deferred        Extremities: Normal range of motion.  Edema: Trace  Mental Status: Normal mood and affect. Normal behavior. Normal judgment and thought content.   Assessment and Plan:  Pregnancy: G3P0020 at [redacted]w[redacted]d  1. Supervision of high risk pregnancy, antepartum (Primary) Patient doing well, feeling regular fetal movement  BP, FHR, FH appropriate  2. [redacted] weeks gestation of pregnancy Anticipatory guidance about next visits/weeks of pregnancy given.  - CBC - Glucose Tolerance, 2 Hours w/1 Hour - HIV antibody (with reflex) - RPR  3. Heart palpitations Stable; Follows OB  cards Metoprolol  25 mg PRN for episodes of palpitations  F/u Dr Emmette Harms 06/20/23  4. Obesity (BMI 30-39.9) 5. Fibroids 05/20/23 growth scan EFW 795 gm (74%), AFI WNL F/u growth Q4-6, next 06/24/23  Preterm labor symptoms and general obstetric precautions including but not limited to vaginal bleeding, contractions, leaking of fluid and fetal movement were reviewed in detail with the patient.  Please refer to After Visit Summary for other counseling recommendations.   No follow-ups on file.  Future Appointments  Date Time Provider Department Center  06/20/2023 11:20 AM Tobb, Kardie, DO [facility]  06/20/2023  2:50 PM Izell Marsh, MD [facility] None  06/24/2023  3:00 PM [facility] PROVIDER 1 [facility]  06/24/2023  3:30 PM [facility] US2 [facility]  08/01/2023 11:00 AM Elta Halter, MD [facility] None    Luevenia Saha, PA-C

## 2023-06-06 NOTE — Progress Notes (Signed)
 Pt presents for ROB visit. No concerns

## 2023-06-07 LAB — CBC
Hematocrit: 34.2 % (ref 34.0–46.6)
Hemoglobin: 11.1 g/dL (ref 11.1–15.9)
MCH: 28.8 pg (ref 26.6–33.0)
MCHC: 32.5 g/dL (ref 31.5–35.7)
MCV: 89 fL (ref 79–97)
Platelets: 176 10*3/uL (ref 150–450)
RBC: 3.85 x10E6/uL (ref 3.77–5.28)
RDW: 12.9 % (ref 11.7–15.4)
WBC: 7.3 10*3/uL (ref 3.4–10.8)

## 2023-06-07 LAB — GLUCOSE TOLERANCE, 2 HOURS W/ 1HR
Glucose, 1 hour: 142 mg/dL (ref 70–179)
Glucose, 2 hour: 101 mg/dL (ref 70–152)
Glucose, Fasting: 80 mg/dL (ref 70–91)

## 2023-06-07 LAB — RPR: RPR Ser Ql: NONREACTIVE

## 2023-06-07 LAB — HIV ANTIBODY (ROUTINE TESTING W REFLEX): HIV Screen 4th Generation wRfx: NONREACTIVE

## 2023-06-08 ENCOUNTER — Ambulatory Visit: Payer: Self-pay | Admitting: Physician Assistant

## 2023-06-20 ENCOUNTER — Ambulatory Visit: Admitting: Obstetrics and Gynecology

## 2023-06-20 ENCOUNTER — Ambulatory Visit: Admitting: Cardiology

## 2023-06-20 VITALS — BP 119/84 | HR 89 | Wt 251.0 lb

## 2023-06-20 DIAGNOSIS — O0990 Supervision of high risk pregnancy, unspecified, unspecified trimester: Secondary | ICD-10-CM

## 2023-06-20 DIAGNOSIS — Z8619 Personal history of other infectious and parasitic diseases: Secondary | ICD-10-CM

## 2023-06-20 DIAGNOSIS — Z23 Encounter for immunization: Secondary | ICD-10-CM | POA: Diagnosis not present

## 2023-06-20 DIAGNOSIS — Z3A29 29 weeks gestation of pregnancy: Secondary | ICD-10-CM

## 2023-06-20 DIAGNOSIS — E669 Obesity, unspecified: Secondary | ICD-10-CM | POA: Diagnosis not present

## 2023-06-20 DIAGNOSIS — D259 Leiomyoma of uterus, unspecified: Secondary | ICD-10-CM

## 2023-06-20 DIAGNOSIS — O3412 Maternal care for benign tumor of corpus uteri, second trimester: Secondary | ICD-10-CM | POA: Diagnosis not present

## 2023-06-20 DIAGNOSIS — R002 Palpitations: Secondary | ICD-10-CM

## 2023-06-20 MED ORDER — FAMOTIDINE 20 MG PO TABS: 20.0000 mg | ORAL_TABLET | Freq: Every day | ORAL | 11 refills | Status: DC

## 2023-06-20 NOTE — Progress Notes (Signed)
   PRENATAL VISIT NOTE  Subjective:  Caitlin Duncan is a 33 y.o. G3P0020 at [redacted]w[redacted]d being seen today for ongoing prenatal care.  She is currently monitored for the following issues for this low-risk pregnancy and has Obesity (BMI 30-39.9); Heart palpitations; Supervision of high risk pregnancy, antepartum; Hx of herpes simplex infection; and Leiomyoma of uterus affecting pregnancy in second trimester on their problem list.  Patient reports rash started after eating at a restaurant. Mucus in throat in AM.  Contractions: Not present. Vag. Bleeding: None.  Movement: Present. Denies leaking of fluid.   The following portions of the patient's history were reviewed and updated as appropriate: allergies, current medications, past family history, past medical history, past social history, past surgical history and problem list.   Objective:   Vitals:   06/20/23 1455  BP: 119/84  Pulse: 89  Weight: 251 lb (113.9 kg)    Fetal Status: Fetal Heart Rate (bpm): 150   Movement: Present     General:  Alert, oriented and cooperative. Patient is in no acute distress.  Skin: Skin is warm and dry. No rash noted.   Cardiovascular: Normal heart rate noted  Respiratory: Normal respiratory effort, no problems with respiration noted  Abdomen: Soft, gravid, appropriate for gestational age.  Pain/Pressure: Absent      Assessment and Plan:  Pregnancy: G3P0020 at [redacted]w[redacted]d 1. Supervision of high risk pregnancy, antepartum (Primary) 2. [redacted] weeks gestation of pregnancy 4. Obesity (BMI 30-39.9) Normal 2h GTT and third tri labs Tdap today Rx for pepcid  sent for suspected reflux Discussed OTC hydrocortisone ointment for rash  3. Leiomyoma of uterus affecting pregnancy in second trimester L posterior 5.26cm, anterior mid uterine 3.33cm Growth US  scheduled 6/2  5. Hx of herpes simplex infection Valtrex  suppression at 36 weeks, currently takes 500mg  daily  6. Heart palpitations Continue metoprolol  prn (taking  every other day) Serial growth US  as above S/p consult w/ Dr. Emmette Harms 3/28, next appt 7/15 (missed appt today due to new location)  Please refer to After Visit Summary for other counseling recommendations.   Return in about 2 weeks (around 07/04/2023) for return OB at 31-32 weeks.  Future Appointments  Date Time Provider Department Center  06/24/2023  3:00 PM [facility] PROVIDER 1 [facility]  06/24/2023  3:30 PM [facility] US2 [facility]  07/05/2023 11:15 AM Gabrielle Joiner, MD [facility] None  08/01/2023 11:00 AM Elta Halter, MD [facility] None  08/06/2023  8:40 AM Emmette Harms, Chyrl Crawford, DO [facility] H&V   Izell Marsh, MD

## 2023-06-20 NOTE — Addendum Note (Signed)
 Addended by: Coralyn Derry D on: 06/20/2023 03:20 PM   Modules accepted: Orders

## 2023-06-20 NOTE — Progress Notes (Signed)
 Pt complains of small red rash that comes and goes - itching.

## 2023-06-24 ENCOUNTER — Ambulatory Visit: Attending: Obstetrics and Gynecology

## 2023-06-24 ENCOUNTER — Ambulatory Visit: Admitting: Maternal & Fetal Medicine

## 2023-06-24 DIAGNOSIS — D259 Leiomyoma of uterus, unspecified: Secondary | ICD-10-CM

## 2023-06-24 DIAGNOSIS — O0990 Supervision of high risk pregnancy, unspecified, unspecified trimester: Secondary | ICD-10-CM | POA: Diagnosis present

## 2023-06-24 DIAGNOSIS — O3412 Maternal care for benign tumor of corpus uteri, second trimester: Secondary | ICD-10-CM

## 2023-06-24 DIAGNOSIS — O99213 Obesity complicating pregnancy, third trimester: Secondary | ICD-10-CM

## 2023-06-24 DIAGNOSIS — E669 Obesity, unspecified: Secondary | ICD-10-CM

## 2023-06-24 DIAGNOSIS — Z3A29 29 weeks gestation of pregnancy: Secondary | ICD-10-CM

## 2023-06-25 ENCOUNTER — Other Ambulatory Visit: Payer: Self-pay | Admitting: *Deleted

## 2023-06-25 DIAGNOSIS — O99213 Obesity complicating pregnancy, third trimester: Secondary | ICD-10-CM

## 2023-06-25 DIAGNOSIS — D259 Leiomyoma of uterus, unspecified: Secondary | ICD-10-CM

## 2023-06-25 NOTE — Progress Notes (Signed)
   Patient information  Patient Name: Caitlin Duncan  Patient MRN:   [id number]  Referring practice: MFM Referring Provider: [place] [facility]  MFM CONSULT  Caitlin Duncan is a 33 y.o. G3P0020 at 105w5d here for ultrasound and consultation. Patient Active Problem List   Diagnosis Date Noted   Leiomyoma of uterus affecting pregnancy in second trimester 05/09/2023   Hx of herpes simplex infection 03/14/2023   Supervision of high risk pregnancy, antepartum 02/07/2023   Heart palpitations 10/17/2022   Obesity (BMI 30-39.9) 04/13/2013    Caitlin Duncan is doing well today with no acute concerns.  The patient is here for a growth US  due to fibroids and elevated BMI. There is one moderate size fibroid about 5 cm in the largest dimension.   Sonographic findings Single intrauterine pregnancy at 29w 4d.  Fetal cardiac activity:  Observed and appears normal. Presentation: Cephalic. Interval fetal anatomy appears normal. Fetal biometry shows the estimated fetal weight at the 75 percentile. Amniotic fluid volume: Within normal limits. MVP: 5.85 cm. Placenta: Anterior. Fibroids: one moderate size fibroid about 5 cm in the largest dimension.   There are limitations of prenatal ultrasound such as the inability to detect certain abnormalities due to poor visualization. Various factors such as fetal position, gestational age and maternal body habitus may increase the difficulty in visualizing the fetal anatomy.    Recommendations -Serial growth ultrasounds every 4-6 weeks until delivery   Review of Systems: A review of systems was performed and was negative except per HPI   Vitals and Physical Exam    06/24/2023    3:13 PM 06/20/2023    2:55 PM 06/06/2023    9:43 AM  Vitals with BMI  Weight  251 lbs 251 lbs  Systolic 112 119 401  Diastolic 76 84 82  Pulse 102 89 83    Sitting comfortably on the sonogram table Nonlabored breathing Normal rate and rhythm Abdomen is  nontender  Past pregnancies OB History  Gravida Para Term Preterm AB Living  3 0 0 0 2 0  SAB IAB Ectopic Multiple Live Births  1 1 0 0     # Outcome Date GA Lbr Len/2nd Weight Sex Type Anes PTL Lv  3 Current           2 SAB 11/22/20 [redacted]w[redacted]d         1 IAB              Birth Comments: System Generated. Please review and update pregnancy details.     I spent 20 minutes reviewing the patients chart, including labs and images as well as counseling the patient about her medical conditions. Greater than 50% of the time was spent in direct face-to-face patient counseling.  Penney Bowling  MFM, [place]   06/25/2023  1:16 PM

## 2023-07-05 ENCOUNTER — Ambulatory Visit (INDEPENDENT_AMBULATORY_CARE_PROVIDER_SITE_OTHER): Admitting: Obstetrics

## 2023-07-05 VITALS — BP 126/83 | HR 90 | Wt 249.4 lb

## 2023-07-05 DIAGNOSIS — O0990 Supervision of high risk pregnancy, unspecified, unspecified trimester: Secondary | ICD-10-CM | POA: Diagnosis not present

## 2023-07-05 DIAGNOSIS — Z8619 Personal history of other infectious and parasitic diseases: Secondary | ICD-10-CM

## 2023-07-05 DIAGNOSIS — O3412 Maternal care for benign tumor of corpus uteri, second trimester: Secondary | ICD-10-CM

## 2023-07-05 DIAGNOSIS — E669 Obesity, unspecified: Secondary | ICD-10-CM

## 2023-07-05 DIAGNOSIS — D259 Leiomyoma of uterus, unspecified: Secondary | ICD-10-CM

## 2023-07-05 NOTE — Progress Notes (Signed)
 Subjective:  Caitlin Duncan is a 33 y.o. G3P0020 at [redacted]w[redacted]d being seen today for ongoing prenatal care.  She is currently monitored for the following issues for this high-risk pregnancy and has Obesity (BMI 30-39.9); Heart palpitations; Supervision of high risk pregnancy, antepartum; Hx of herpes simplex infection; and Leiomyoma of uterus affecting pregnancy in second trimester on their problem list.  Patient reports no complaints.  Contractions: Not present. Vag. Bleeding: None.  Movement: Present. Denies leaking of fluid.   The following portions of the patient's history were reviewed and updated as appropriate: allergies, current medications, past family history, past medical history, past social history, past surgical history and problem list. Problem list updated.  Objective:   Vitals:   07/05/23 1135  BP: 126/83  Pulse: 90  Weight: 249 lb 6.4 oz (113.1 kg)    Fetal Status: Fetal Heart Rate (bpm): 138   Movement: Present     General:  Alert, oriented and cooperative. Patient is in no acute distress.  Skin: Skin is warm and dry. No rash noted.   Cardiovascular: Normal heart rate noted  Respiratory: Normal respiratory effort, no problems with respiration noted  Abdomen: Soft, gravid, appropriate for gestational age. Pain/Pressure: Present     Pelvic:  Cervical exam deferred        Extremities: Normal range of motion.  Edema: Trace (Feet)  Mental Status: Normal mood and affect. Normal behavior. Normal judgment and thought content.   Urinalysis:      Assessment and Plan:  Pregnancy: G3P0020 at [redacted]w[redacted]d  1. Supervision of high risk pregnancy, antepartum (Primary)  2. Leiomyoma of uterus affecting pregnancy in second trimester  3. Obesity (BMI 30-39.9)  4. Hx of herpes simplex infection    There are no diagnoses linked to this encounter. Preterm labor symptoms and general obstetric precautions including but not limited to vaginal bleeding, contractions, leaking of fluid and  fetal movement were reviewed in detail with the patient. Please refer to After Visit Summary for other counseling recommendations.   Return in about 4 weeks (around 08/02/2023) for [facility].   Gabrielle Joiner, MD 07/05/2023

## 2023-07-05 NOTE — Progress Notes (Signed)
 Pt presents for rob. Pt has no questions or concerns at this time.

## 2023-07-19 ENCOUNTER — Ambulatory Visit (INDEPENDENT_AMBULATORY_CARE_PROVIDER_SITE_OTHER): Admitting: Obstetrics and Gynecology

## 2023-07-19 ENCOUNTER — Other Ambulatory Visit (HOSPITAL_COMMUNITY)
Admission: RE | Admit: 2023-07-19 | Discharge: 2023-07-19 | Disposition: A | Discharge status: Home / Self Care | Source: Ambulatory Visit | Attending: Obstetrics and Gynecology | Admitting: Obstetrics and Gynecology

## 2023-07-19 VITALS — BP 115/81 | HR 96 | Wt 253.0 lb

## 2023-07-19 DIAGNOSIS — Z3493 Encounter for supervision of normal pregnancy, unspecified, third trimester: Secondary | ICD-10-CM

## 2023-07-19 DIAGNOSIS — O3412 Maternal care for benign tumor of corpus uteri, second trimester: Secondary | ICD-10-CM

## 2023-07-19 DIAGNOSIS — N898 Other specified noninflammatory disorders of vagina: Secondary | ICD-10-CM

## 2023-07-19 DIAGNOSIS — Z8619 Personal history of other infectious and parasitic diseases: Secondary | ICD-10-CM

## 2023-07-19 DIAGNOSIS — Z3A33 33 weeks gestation of pregnancy: Secondary | ICD-10-CM | POA: Diagnosis not present

## 2023-07-19 DIAGNOSIS — D259 Leiomyoma of uterus, unspecified: Secondary | ICD-10-CM

## 2023-07-19 DIAGNOSIS — R002 Palpitations: Secondary | ICD-10-CM

## 2023-07-19 NOTE — Progress Notes (Signed)
 Pt would like self swab to r/o yeast - pt has occ d/c and itching.

## 2023-07-19 NOTE — Progress Notes (Signed)
   PRENATAL VISIT NOTE  Subjective:  Caitlin Duncan is a 33 y.o. G3P0020 at [redacted]w[redacted]d being seen today for ongoing prenatal care.  She is currently monitored for the following issues for this low-risk pregnancy and has Obesity (BMI 30-39.9); Heart palpitations; Supervision of high risk pregnancy, antepartum; Hx of herpes simplex infection; and Leiomyoma of uterus affecting pregnancy in second trimester on their problem list.  Patient reports vaginal irritation.  Contractions: Not present. Vag. Bleeding: None.  Movement: Present. Denies leaking of fluid.   The following portions of the patient's history were reviewed and updated as appropriate: allergies, current medications, past family history, past medical history, past social history, past surgical history and problem list.   Objective:   Vitals:   07/19/23 1131  BP: 115/81  Pulse: 96  Weight: 253 lb (114.8 kg)    Fetal Status:     Movement: Present     General:  Alert, oriented and cooperative. Patient is in no acute distress.  Skin: Skin is warm and dry. No rash noted.   Cardiovascular: Normal heart rate noted  Respiratory: Normal respiratory effort, no problems with respiration noted  Abdomen: Soft, gravid, appropriate for gestational age.  Pain/Pressure: Present      Assessment and Plan:  Pregnancy: G3P0020 at [redacted]w[redacted]d 1. Supervision of high risk pregnancy, antepartum (Primary) 2. [redacted] weeks gestation of pregnancy Discussed GBS/GC/CT next visit  3. Vaginal discharge - Cervicovaginal ancillary only( [place])  4. Leiomyoma of uterus affecting pregnancy in second trimester @29 /4: 1609g (75%); noted fibroid mid-posterior 4.7 x 2.56 x 5 cm  Next US  scheduled 6/30  5. Hx of herpes simplex infection Valtrex  suppression at 36 weeks, currently takes 500mg  daily   6. Heart palpitations Continue metoprolol  prn Serial growth US  as above S/p consult w/ Dr. Sheena 3/28, next appt 7/15  Please refer to After Visit Summary for  other counseling recommendations.   Return in about 2 weeks (around 08/02/2023) for ROB at 35 weeks with GBS/GC/CT.  Future Appointments  Date Time Provider Department Center  07/22/2023  1:00 PM [facility] PROVIDER 1 [facility]  07/22/2023  1:30 PM [facility] US2 [facility]  08/01/2023 11:00 AM Hester Alm BROCKS, MD [facility] None  08/01/2023  4:10 PM Davis, Devon E, PA-C [facility] None  08/06/2023  8:40 AM Tobb, Kardie, DO [facility] H&V  08/19/2023  3:00 PM [facility] PROVIDER 1 [facility]  08/19/2023  3:30 PM [facility] US2 [facility]    Kieth BROCKS Carolin, MD

## 2023-07-22 ENCOUNTER — Ambulatory Visit: Payer: Self-pay | Admitting: Obstetrics and Gynecology

## 2023-07-22 ENCOUNTER — Ambulatory Visit (HOSPITAL_BASED_OUTPATIENT_CLINIC_OR_DEPARTMENT_OTHER)

## 2023-07-22 ENCOUNTER — Ambulatory Visit: Attending: Maternal & Fetal Medicine | Admitting: Obstetrics and Gynecology

## 2023-07-22 VITALS — BP 120/81 | HR 96

## 2023-07-22 DIAGNOSIS — D259 Leiomyoma of uterus, unspecified: Secondary | ICD-10-CM | POA: Diagnosis not present

## 2023-07-22 DIAGNOSIS — Z3A33 33 weeks gestation of pregnancy: Secondary | ICD-10-CM

## 2023-07-22 DIAGNOSIS — O3413 Maternal care for benign tumor of corpus uteri, third trimester: Secondary | ICD-10-CM | POA: Diagnosis not present

## 2023-07-22 DIAGNOSIS — O0990 Supervision of high risk pregnancy, unspecified, unspecified trimester: Secondary | ICD-10-CM

## 2023-07-22 DIAGNOSIS — E669 Obesity, unspecified: Secondary | ICD-10-CM

## 2023-07-22 DIAGNOSIS — O99213 Obesity complicating pregnancy, third trimester: Secondary | ICD-10-CM

## 2023-07-22 LAB — CERVICOVAGINAL ANCILLARY ONLY
Bacterial Vaginitis (gardnerella): POSITIVE — AB
Candida Glabrata: NEGATIVE
Candida Vaginitis: POSITIVE — AB
Comment: NEGATIVE
Comment: NEGATIVE
Comment: NEGATIVE

## 2023-07-22 MED ORDER — METRONIDAZOLE 500 MG PO TABS: 500.0000 mg | ORAL_TABLET | Freq: Two times a day (BID) | ORAL | 0 refills | Status: DC

## 2023-07-22 MED ORDER — CLOTRIMAZOLE 1 % VA CREA: 1.0000 | TOPICAL_CREAM | Freq: Every day | VAGINAL | 0 refills | Status: DC

## 2023-07-22 NOTE — Progress Notes (Signed)
 After review, MFM consult with provider is not indicated for today  Arna Ranks, MD 07/22/2023 2:06 PM  [facility]

## 2023-07-28 ENCOUNTER — Encounter (HOSPITAL_COMMUNITY): Payer: Self-pay

## 2023-07-28 ENCOUNTER — Encounter (HOSPITAL_COMMUNITY): Payer: Self-pay | Admitting: Obstetrics & Gynecology

## 2023-07-28 ENCOUNTER — Inpatient Hospital Stay (HOSPITAL_COMMUNITY): Admitting: Anesthesiology

## 2023-07-28 ENCOUNTER — Inpatient Hospital Stay (HOSPITAL_COMMUNITY)
Admission: AD | Admit: 2023-07-28 | Discharge: 2023-07-30 | DRG: 806 | Disposition: A | Discharge status: Home / Self Care | Attending: Obstetrics & Gynecology | Admitting: Obstetrics & Gynecology

## 2023-07-28 ENCOUNTER — Other Ambulatory Visit: Payer: Self-pay

## 2023-07-28 DIAGNOSIS — B009 Herpesviral infection, unspecified: Secondary | ICD-10-CM | POA: Diagnosis present

## 2023-07-28 DIAGNOSIS — O9852 Other viral diseases complicating childbirth: Secondary | ICD-10-CM | POA: Diagnosis present

## 2023-07-28 DIAGNOSIS — D259 Leiomyoma of uterus, unspecified: Secondary | ICD-10-CM | POA: Diagnosis present

## 2023-07-28 DIAGNOSIS — O3413 Maternal care for benign tumor of corpus uteri, third trimester: Secondary | ICD-10-CM | POA: Diagnosis present

## 2023-07-28 DIAGNOSIS — E669 Obesity, unspecified: Secondary | ICD-10-CM | POA: Diagnosis present

## 2023-07-28 DIAGNOSIS — Z8249 Family history of ischemic heart disease and other diseases of the circulatory system: Secondary | ICD-10-CM

## 2023-07-28 DIAGNOSIS — O9081 Anemia of the puerperium: Secondary | ICD-10-CM | POA: Diagnosis not present

## 2023-07-28 DIAGNOSIS — Z3A34 34 weeks gestation of pregnancy: Secondary | ICD-10-CM | POA: Diagnosis not present

## 2023-07-28 DIAGNOSIS — D62 Acute posthemorrhagic anemia: Secondary | ICD-10-CM | POA: Diagnosis not present

## 2023-07-28 DIAGNOSIS — E66813 Obesity, class 3: Secondary | ICD-10-CM | POA: Diagnosis present

## 2023-07-28 DIAGNOSIS — O99214 Obesity complicating childbirth: Secondary | ICD-10-CM | POA: Diagnosis present

## 2023-07-28 DIAGNOSIS — Z8619 Personal history of other infectious and parasitic diseases: Secondary | ICD-10-CM | POA: Diagnosis present

## 2023-07-28 DIAGNOSIS — O42013 Preterm premature rupture of membranes, onset of labor within 24 hours of rupture, third trimester: Secondary | ICD-10-CM | POA: Diagnosis present

## 2023-07-28 DIAGNOSIS — R002 Palpitations: Secondary | ICD-10-CM | POA: Diagnosis present

## 2023-07-28 DIAGNOSIS — O9832 Other infections with a predominantly sexual mode of transmission complicating childbirth: Secondary | ICD-10-CM | POA: Diagnosis not present

## 2023-07-28 DIAGNOSIS — O42913 Preterm premature rupture of membranes, unspecified as to length of time between rupture and onset of labor, third trimester: Secondary | ICD-10-CM | POA: Diagnosis present

## 2023-07-28 LAB — CBC
HCT: 34.2 % — ABNORMAL LOW (ref 36.0–46.0)
Hemoglobin: 11.4 g/dL — ABNORMAL LOW (ref 12.0–15.0)
MCH: 29 pg (ref 26.0–34.0)
MCHC: 33.3 g/dL (ref 30.0–36.0)
MCV: 87 fL (ref 80.0–100.0)
Platelets: 187 K/uL (ref 150–400)
RBC: 3.93 MIL/uL (ref 3.87–5.11)
RDW: 13.4 % (ref 11.5–15.5)
WBC: 6.8 K/uL (ref 4.0–10.5)
nRBC: 0 % (ref 0.0–0.2)

## 2023-07-28 LAB — URINALYSIS, ROUTINE W REFLEX MICROSCOPIC
Bacteria, UA: NONE SEEN
Bilirubin Urine: NEGATIVE
Glucose, UA: NEGATIVE mg/dL
Hgb urine dipstick: NEGATIVE
Ketones, ur: NEGATIVE mg/dL
Nitrite: NEGATIVE
Protein, ur: 100 mg/dL — AB
Specific Gravity, Urine: 1.026 (ref 1.005–1.030)
pH: 5 (ref 5.0–8.0)

## 2023-07-28 LAB — GROUP B STREP BY PCR: Group B strep by PCR: NEGATIVE

## 2023-07-28 LAB — POCT FERN TEST: POCT Fern Test: POSITIVE

## 2023-07-28 LAB — TYPE AND SCREEN
ABO/RH(D): B POS
Antibody Screen: NEGATIVE

## 2023-07-28 LAB — RPR: RPR Ser Ql: NONREACTIVE

## 2023-07-28 MED ORDER — WITCH HAZEL-GLYCERIN EX PADS
1.0000 | MEDICATED_PAD | CUTANEOUS | Status: DC | PRN
  Administered 2023-07-28: 1 via TOPICAL

## 2023-07-28 MED ORDER — IBUPROFEN 600 MG PO TABS
600.0000 mg | ORAL_TABLET | Freq: Four times a day (QID) | ORAL | Status: DC
  Administered 2023-07-28 – 2023-07-30 (×7): 600 mg via ORAL
  Filled 2023-07-28 (×7): qty 1

## 2023-07-28 MED ORDER — SODIUM CHLORIDE 0.9% FLUSH: 3.0000 mL | Freq: Two times a day (BID) | INTRAVENOUS | Status: DC

## 2023-07-28 MED ORDER — PHENYLEPHRINE 80 MCG/ML (10ML) SYRINGE FOR IV PUSH (FOR BLOOD PRESSURE SUPPORT): 80.0000 ug | PREFILLED_SYRINGE | INTRAVENOUS | Status: DC | PRN

## 2023-07-28 MED ORDER — OXYTOCIN-SODIUM CHLORIDE 30-0.9 UT/500ML-% IV SOLN: 2.5000 [IU]/h | INTRAVENOUS | Status: DC

## 2023-07-28 MED ORDER — ZOLPIDEM TARTRATE 5 MG PO TABS: 5.0000 mg | ORAL_TABLET | Freq: Every evening | ORAL | Status: DC | PRN

## 2023-07-28 MED ORDER — EPHEDRINE 5 MG/ML INJ: 10.0000 mg | INTRAVENOUS | Status: DC | PRN

## 2023-07-28 MED ORDER — PENICILLIN G POT IN DEXTROSE 60000 UNIT/ML IV SOLN
3.0000 10*6.[IU] | INTRAVENOUS | Status: DC
  Administered 2023-07-28: 3 10*6.[IU] via INTRAVENOUS
  Filled 2023-07-28: qty 50

## 2023-07-28 MED ORDER — BETAMETHASONE SOD PHOS & ACET 6 (3-3) MG/ML IJ SUSP
12.0000 mg | Freq: Once | INTRAMUSCULAR | Status: AC
  Administered 2023-07-28: 12 mg via INTRAMUSCULAR
  Filled 2023-07-28: qty 5

## 2023-07-28 MED ORDER — LACTATED RINGERS IV SOLN
500.0000 mL | Freq: Once | INTRAVENOUS | Status: AC
  Administered 2023-07-28: 500 mL via INTRAVENOUS

## 2023-07-28 MED ORDER — ACETAMINOPHEN 325 MG PO TABS: 650.0000 mg | ORAL_TABLET | ORAL | Status: DC | PRN

## 2023-07-28 MED ORDER — SIMETHICONE 80 MG PO CHEW: 80.0000 mg | CHEWABLE_TABLET | ORAL | Status: DC | PRN

## 2023-07-28 MED ORDER — BENZOCAINE-MENTHOL 20-0.5 % EX AERO
1.0000 | INHALATION_SPRAY | CUTANEOUS | Status: DC | PRN
Start: 2023-07-28 — End: 2023-07-30
  Administered 2023-07-28 – 2023-07-30 (×2): 1 via TOPICAL
  Filled 2023-07-28 (×2): qty 56

## 2023-07-28 MED ORDER — MISOPROSTOL 25 MCG QUARTER TABLET: 25.0000 ug | ORAL_TABLET | ORAL | Status: DC | PRN

## 2023-07-28 MED ORDER — ONDANSETRON HCL 4 MG/2ML IJ SOLN: 4.0000 mg | Freq: Four times a day (QID) | INTRAMUSCULAR | Status: DC | PRN

## 2023-07-28 MED ORDER — LIDOCAINE HCL (PF) 1 % IJ SOLN: 30.0000 mL | INTRAMUSCULAR | Status: DC | PRN

## 2023-07-28 MED ORDER — SENNOSIDES-DOCUSATE SODIUM 8.6-50 MG PO TABS
2.0000 | ORAL_TABLET | ORAL | Status: DC
  Administered 2023-07-28 – 2023-07-30 (×3): 2 via ORAL
  Filled 2023-07-28 (×3): qty 2

## 2023-07-28 MED ORDER — FENTANYL CITRATE (PF) 100 MCG/2ML IJ SOLN: 50.0000 ug | INTRAMUSCULAR | Status: DC | PRN

## 2023-07-28 MED ORDER — MEASLES, MUMPS & RUBELLA VAC IJ SOLR: 0.5000 mL | Freq: Once | INTRAMUSCULAR | Status: DC

## 2023-07-28 MED ORDER — LACTATED RINGERS IV SOLN: INTRAVENOUS | Status: DC

## 2023-07-28 MED ORDER — SODIUM CHLORIDE 0.9% FLUSH: 3.0000 mL | INTRAVENOUS | Status: DC | PRN

## 2023-07-28 MED ORDER — SODIUM CHLORIDE 0.9 % IV SOLN: 250.0000 mL | INTRAVENOUS | Status: DC | PRN

## 2023-07-28 MED ORDER — OXYCODONE HCL 5 MG PO TABS
5.0000 mg | ORAL_TABLET | ORAL | Status: DC | PRN
  Administered 2023-07-29 – 2023-07-30 (×3): 5 mg via ORAL
  Filled 2023-07-28 (×3): qty 1

## 2023-07-28 MED ORDER — SODIUM CHLORIDE 0.9 % IV SOLN
5.0000 10*6.[IU] | Freq: Once | INTRAVENOUS | Status: AC
  Administered 2023-07-28: 5 10*6.[IU] via INTRAVENOUS
  Filled 2023-07-28: qty 5

## 2023-07-28 MED ORDER — OXYTOCIN-SODIUM CHLORIDE 30-0.9 UT/500ML-% IV SOLN
1.0000 m[IU]/min | INTRAVENOUS | Status: DC
  Administered 2023-07-28: 2 m[IU]/min via INTRAVENOUS
  Filled 2023-07-28: qty 500

## 2023-07-28 MED ORDER — OXYTOCIN BOLUS FROM INFUSION
333.0000 mL | Freq: Once | INTRAVENOUS | Status: AC
  Administered 2023-07-28: 333 mL via INTRAVENOUS

## 2023-07-28 MED ORDER — SOD CITRATE-CITRIC ACID 500-334 MG/5ML PO SOLN: 30.0000 mL | ORAL | Status: DC | PRN

## 2023-07-28 MED ORDER — ACETAMINOPHEN 325 MG PO TABS
650.0000 mg | ORAL_TABLET | ORAL | Status: DC | PRN
  Administered 2023-07-28 – 2023-07-29 (×3): 650 mg via ORAL
  Filled 2023-07-28 (×4): qty 2

## 2023-07-28 MED ORDER — DIPHENHYDRAMINE HCL 25 MG PO CAPS: 25.0000 mg | ORAL_CAPSULE | Freq: Four times a day (QID) | ORAL | Status: DC | PRN

## 2023-07-28 MED ORDER — METOPROLOL SUCCINATE ER 25 MG PO TB24
25.0000 mg | ORAL_TABLET | Freq: Every day | ORAL | Status: DC
  Filled 2023-07-28: qty 1

## 2023-07-28 MED ORDER — DIPHENHYDRAMINE HCL 50 MG/ML IJ SOLN: 12.5000 mg | INTRAMUSCULAR | Status: DC | PRN

## 2023-07-28 MED ORDER — ONDANSETRON HCL 4 MG PO TABS: 4.0000 mg | ORAL_TABLET | ORAL | Status: DC | PRN

## 2023-07-28 MED ORDER — DIBUCAINE (PERIANAL) 1 % EX OINT: 1.0000 | TOPICAL_OINTMENT | CUTANEOUS | Status: DC | PRN

## 2023-07-28 MED ORDER — LIDOCAINE HCL (PF) 1 % IJ SOLN
INTRAMUSCULAR | Status: DC | PRN
  Administered 2023-07-28: 8 mL via EPIDURAL

## 2023-07-28 MED ORDER — TERBUTALINE SULFATE 1 MG/ML IJ SOLN: 0.2500 mg | Freq: Once | INTRAMUSCULAR | Status: DC | PRN

## 2023-07-28 MED ORDER — ONDANSETRON HCL 4 MG/2ML IJ SOLN: 4.0000 mg | INTRAMUSCULAR | Status: DC | PRN

## 2023-07-28 MED ORDER — COCONUT OIL OIL
1.0000 | TOPICAL_OIL | Status: DC | PRN
  Administered 2023-07-30: 1 via TOPICAL

## 2023-07-28 MED ORDER — TETANUS-DIPHTH-ACELL PERTUSSIS 5-2.5-18.5 LF-MCG/0.5 IM SUSY: 0.5000 mL | PREFILLED_SYRINGE | Freq: Once | INTRAMUSCULAR | Status: DC

## 2023-07-28 MED ORDER — PRENATAL MULTIVITAMIN CH
1.0000 | ORAL_TABLET | Freq: Every day | ORAL | Status: DC
  Administered 2023-07-28 – 2023-07-30 (×3): 1 via ORAL
  Filled 2023-07-28 (×3): qty 1

## 2023-07-28 MED ORDER — FENTANYL-BUPIVACAINE-NACL 0.5-0.125-0.9 MG/250ML-% EP SOLN
12.0000 mL/h | EPIDURAL | Status: DC | PRN
  Administered 2023-07-28: 12 mL/h via EPIDURAL
  Filled 2023-07-28: qty 250

## 2023-07-28 MED ORDER — LACTATED RINGERS IV SOLN: 500.0000 mL | INTRAVENOUS | Status: DC | PRN

## 2023-07-28 NOTE — MAU Note (Signed)
 Pt informed that the ultrasound is considered a limited OB ultrasound and is not intended to be a complete ultrasound exam.  Patient also informed that the ultrasound is not being completed with the intent of assessing for fetal or placental anomalies or any pelvic abnormalities.  Explained that the purpose of today's ultrasound is to assess for  presentation.  Patient acknowledges the purpose of the exam and the limitations of the study.     Verified by RN with CNM at bedside.  [facility].

## 2023-07-28 NOTE — Lactation Note (Signed)
 This note was copied from a baby's chart.  NICU Lactation Consultation Note  Patient Name: Caitlin Duncan Date: 07/28/2023 Age:33 hours  Reason for consult: Initial assessment; NICU baby; Primapara; 1st time breastfeeding; Late-preterm 34-36.6wks; Infant < 6lbs  SUBJECTIVE  LC in to visit with P1 Mom of LPTI Caitlin Duncan delivered vaginally and admitted to NICU for prematurity.  Baby is on room air and being gavage fed DBM.  Mom rooming-in in Couplet Care in the NICU.    Mom states she would like to breastfeed her baby.  LC set up DEBP and provided Mom with a pumping top and assisted her with her first pumping using 21 mm flanges.  Mom was educated on hand expression and breast massage and the power of having baby STS.  LC set up a washing and a drying basin, labeled and CDC guidelines provided for cleaning pump parts.  Sanitizing bag provided with instructions on letting baby's RN know the parts are clean and ready to be sanitized per CDC guidelines once per 24 hrs.  Encouraged Mom to ask for assistance/guidance prn.  OBJECTIVE Infant data: Mother's Current Feeding Choice: Breast Milk and Donor Milk  O2 Device: Room Air  Infant feeding assessment IDFTS - Readiness: 4   Maternal data: [id number] Vaginal, Spontaneous Has patient been taught Hand Expression?: Yes Hand Expression Comments: Glistening of colostrum noted Significant Breast History:: No breast changes in pregnancy Current breast feeding challenges:: Infant admitted to NICU Does the patient have breastfeeding experience prior to this delivery?: No Pumping frequency: Initiated double pumping at 3 hrs post delivery.  Mom encouraged to pump every 3 hrs Flange Size: 21 Hands-free pumping top sizes: Large [place]) Risk factor for low/delayed milk supply:: Infant delivered at [redacted]w[redacted]d  [facility] Program: No WIC Referral Sent?: No Pump: Personal, DEBP (Medela pump through insurance)  ASSESSMENT Infant:  Feeding Status:  Scheduled 9-12-3-6 Feeding method: Tube/Gavage (Bolus)  Maternal: No data recorded INTERVENTIONS/PLAN Interventions: Interventions: Breast feeding basics reviewed; Skin to skin; Breast massage; Hand express; DEBP; Education; [employer] brochure; NICU Pumping Log; CDC Guidelines for Breast Pump Cleaning Tools: Pump; Flanges; Hands-free pumping top Pump Education: Setup, frequency, and cleaning; Milk Storage  Plan: Consult Status: NICU follow-up NICU Follow-up type: New admission follow up   Claudene Aleck BRAVO 07/28/2023, 4:39 PM

## 2023-07-28 NOTE — MAU Note (Signed)
 Caitlin Duncan is a 33 y.o. at [redacted]w[redacted]d here in MAU reporting:   Pt states that at 9pm she felt like her water broke. Pt states it was clear with mucous. Pt states she thought it was her mucous plug, took a shower and then continued to leak.  No bleeding  +FM.   6/10 pelvic pain. Constant.   FHT: 164 Lab orders placed from triage: Fern/urine.   Vitals:   07/28/23 0045  BP: 127/77  Pulse: 87  Resp: 17  Temp: 98.2 F (36.8 C)  SpO2: 96%

## 2023-07-28 NOTE — Plan of Care (Signed)

## 2023-07-28 NOTE — Discharge Summary (Shared)
 Postpartum Discharge Summary  Date of Service updated***     Patient Name: Caitlin Duncan DOB: May 05, 1990 MRN: [id number]  Date of admission: 07/28/2023 Delivery date:07/28/2023 Delivering provider: HONORE MILLMAN E Date of discharge: 07/28/2023  Admitting diagnosis: Preterm premature rupture of membranes [O42.919] Intrauterine pregnancy: [redacted]w[redacted]d     Secondary diagnosis:  Principal Problem:   Preterm premature rupture of membranes (PPROM) with onset of labor within 24 hours of rupture in third trimester, antepartum Active Problems:   Obesity (BMI 30-39.9)   Heart palpitations   Hx of herpes simplex infection   Leiomyoma of uterus affecting pregnancy in second trimester   NSVD  Additional problems: ***    Discharge diagnosis: Preterm Pregnancy Delivered                                              Post partum procedures:{Postpartum procedures:23558} Augmentation: Pitocin  Complications: {OB Labor/Delivery Complications:20784}  Hospital course: Onset of Labor With Vaginal Delivery      33 y.o. yo G3P0020 at [redacted]w[redacted]d was admitted in Latent Labor on 07/28/2023 with PPROM. Labor course was uncomplicated.  Membrane Rupture Time/Date: 9:00 PM,07/28/2023  Delivery Method:Vaginal, Spontaneous Operative Delivery:N/A Episiotomy: None Lacerations:  2nd degree;Perineal Patient had a postpartum course complicated by ***.  She is ambulating, tolerating a regular diet, passing flatus, and urinating well. Patient is discharged home in stable condition on 07/28/23.  Newborn Data: Birth date:07/28/2023 Birth time:10:56 AM Gender:Female Living status:Living Apgars:8 ,9  Weight:2450 g  Magnesium Sulfate received: {Mag received:30440022} BMZ received: Yes Rhophylac:N/A MMR:Yes T-DaP:Given prenatally Flu: Yes RSV Vaccine received: No Transfusion:{Transfusion received:30440034}  Immunizations received: Immunization History  Administered Date(s) Administered   HPV Quadrivalent 03/13/2010,  06/06/2010, 03/14/2011   Influenza-Unspecified 11/05/2017   PPD Test 10/25/2014   Tdap 04/13/2013, 06/20/2023    Physical exam  Vitals:   07/28/23 0932 07/28/23 1000 07/28/23 1100 07/28/23 1115  BP: 113/69 113/73 (!) 117/59 112/68  Pulse: 64 (!) 110 (!) 123 (!) 102  Resp: 14 18 16 14   Temp:    97.9 F (36.6 C)  TempSrc:    Oral  SpO2:      Weight:      Height:       General: {Exam; general:21111117} Lochia: {Desc; appropriate/inappropriate:30686::appropriate} Uterine Fundus: {Desc; firm/soft:30687} Incision: {Exam; incision:21111123} DVT Evaluation: {Exam; dvt:2111122} Labs: Lab Results  Component Value Date   WBC 6.8 07/28/2023   HGB 11.4 (L) 07/28/2023   HCT 34.2 (L) 07/28/2023   MCV 87.0 07/28/2023   PLT 187 07/28/2023      Latest Ref Rng & Units 02/07/2023   11:32 AM  CMP  Glucose 70 - 99 mg/dL 83   BUN 6 - 20 mg/dL 10   Creatinine 9.42 - 1.00 mg/dL 9.46   Sodium 865 - 855 mmol/L 138   Potassium 3.5 - 5.2 mmol/L 4.2   Chloride 96 - 106 mmol/L 105   CO2 20 - 29 mmol/L 16   Calcium  8.7 - 10.2 mg/dL 9.2   Total Protein 6.0 - 8.5 g/dL 6.8   Total Bilirubin 0.0 - 1.2 mg/dL <9.7   Alkaline Phos 44 - 121 IU/L 61   AST 0 - 40 IU/L 10   ALT 0 - 32 IU/L 8    Edinburgh Score:     No data to display         No data  recorded  After visit meds:  Allergies as of 07/28/2023   No Known Allergies   Med Rec must be completed prior to using this [facility]***        Discharge home in stable condition Infant Feeding: Breast Infant Disposition:{CHL IP OB HOME WITH FNUYZM:76418} Discharge instruction: per After Visit Summary and Postpartum booklet. Activity: Advance as tolerated. Pelvic rest for 6 weeks.  Diet: routine diet Future Appointments: Future Appointments  Date Time Provider Department Center  08/01/2023 11:00 AM Hester Alm BROCKS, MD [facility] None  08/01/2023  4:10 PM Davis, Devon E, PA-C [facility] None  08/06/2023  8:40 AM Tobb, Kardie, DO [facility]  [facility]  08/19/2023  3:00 PM [facility] PROVIDER 1 [facility]  08/19/2023  3:30 PM [facility] US2 [facility]   Follow up Visit:  Follow-up Information     [facility] [place]. Schedule an appointment as soon as possible for a visit.   Specialty: Obstetrics and Gynecology Why: Routine postpartum follow up in 4-6 weeks Contact information: [address], [address] [place]  [address] [phone]               Message sent to [facility] 7/6  Please schedule this patient for a In person postpartum visit in 4 weeks with the following provider: Any provider. Additional Postpartum F/U:None  Low risk pregnancy complicated by: Hx HSV and palpitaitions Delivery mode:  Vaginal, Spontaneous Anticipated Birth Control:  Unsure   07/28/2023 Mardy Shropshire, MD

## 2023-07-28 NOTE — Anesthesia Procedure Notes (Signed)
 Epidural Patient location during procedure: OB Start time: 07/28/2023 7:40 AM End time: 07/28/2023 7:50 AM  Staffing Anesthesiologist: Merla Almarie HERO, DO Performed: anesthesiologist   Preanesthetic Checklist Completed: patient identified, IV checked, risks and benefits discussed, monitors and equipment checked, pre-op evaluation and timeout performed  Epidural Patient position: sitting Prep: DuraPrep and site prepped and draped Patient monitoring: continuous pulse ox, blood pressure, heart rate and cardiac monitor Approach: midline Location: L3-L4 Injection technique: LOR air  Needle:  Needle type: Tuohy  Needle gauge: 17 G Needle length: 9 cm Needle insertion depth: 6 cm Catheter type: closed end flexible Catheter size: 19 Gauge Catheter at skin depth: 11 cm Test dose: negative  Assessment Sensory level: T8 Events: blood not aspirated, no cerebrospinal fluid, injection not painful, no injection resistance, no paresthesia and negative IV test  Additional Notes Patient identified. Risks/Benefits/Options discussed with patient including but not limited to bleeding, infection, nerve damage, paralysis, failed block, incomplete pain control, headache, blood pressure changes, nausea, vomiting, reactions to medication both or allergic, itching and postpartum back pain. Confirmed with bedside nurse the patient's most recent platelet count. Confirmed with patient that they are not currently taking any anticoagulation, have any bleeding history or any family history of bleeding disorders. Patient expressed understanding and wished to proceed. All questions were answered. Sterile technique was used throughout the entire procedure. Please see nursing notes for vital signs. Test dose was given through epidural catheter and negative prior to continuing to dose epidural or start infusion. Warning signs of high block given to the patient including shortness of breath, tingling/numbness in  hands, complete motor block, or any concerning symptoms with instructions to call for help. Patient was given instructions on fall risk and not to get out of bed. All questions and concerns addressed with instructions to call with any issues or inadequate analgesia.  Reason for block:procedure for pain

## 2023-07-28 NOTE — Anesthesia Preprocedure Evaluation (Signed)
 Anesthesia Evaluation  Patient identified by MRN, date of birth, ID band Patient awake    Reviewed: Allergy & Precautions, Patient's Chart, lab work & pertinent test results  Airway Mallampati: III  TM Distance: >3 FB Neck ROM: Full    Dental no notable dental hx.    Pulmonary neg pulmonary ROS   Pulmonary exam normal breath sounds clear to auscultation       Cardiovascular Normal cardiovascular exam+ dysrhythmias (palpitations- on metoprolol )  Rhythm:Regular Rate:Normal     Neuro/Psych negative neurological ROS  negative psych ROS   GI/Hepatic negative GI ROS, Neg liver ROS,,,  Endo/Other    Class 3 obesity (BMI 41)  Renal/GU negative Renal ROS  negative genitourinary   Musculoskeletal negative musculoskeletal ROS (+)    Abdominal  (+) + obese  Peds negative pediatric ROS (+)  Hematology  (+) Blood dyscrasia, anemia Hb 11.4, plt 187   Anesthesia Other Findings   Reproductive/Obstetrics (+) Pregnancy SROM 34 3/7wks                              Anesthesia Physical Anesthesia Plan  ASA: 3  Anesthesia Plan: Epidural   Post-op Pain Management:    Induction:   PONV Risk Score and Plan: 2  Airway Management Planned: Natural Airway  Additional Equipment: None  Intra-op Plan:   Post-operative Plan:   Informed Consent: I have reviewed the patients History and Physical, chart, labs and discussed the procedure including the risks, benefits and alternatives for the proposed anesthesia with the patient or authorized representative who has indicated his/her understanding and acceptance.       Plan Discussed with:   Anesthesia Plan Comments:         Anesthesia Quick Evaluation

## 2023-07-28 NOTE — H&P (Signed)
 Caitlin Duncan is a 33 y.o. female, G3P0020 at 34.3 weeks, presenting for pPROM. She reports gush of fluid around 2100 with continued leaking. Reports some cramping that started after.  She endorses fetal movement and denies vaginal bleeding. Patient receives care at [facility] and was supervised for a low-risk pregnancy. Pregnancy and medical history significant for problems as listed below. She is GBS unknown and expresses a desire for epidural for pain management.  She is anticipating a female infant and requests PP birth control method, but is unsure of type.     Patient Active Problem List   Diagnosis Date Noted   Leiomyoma of uterus affecting pregnancy in second trimester 05/09/2023   Hx of herpes simplex infection 03/14/2023   Supervision of high risk pregnancy, antepartum 02/07/2023   Heart palpitations 10/17/2022   Obesity (BMI 30-39.9) 04/13/2013    History of present pregnancy:  Last evaluation:  07/19/2023 in office with Dr. LOIS Duncan.  BP: 115/81  Pulse: 96  Weight: 253 lb (114.8 kg)   US  on 07/22/2023 showing SIUP with fetal growth at 63rd%ile-5lbs 4oz (2384gr) with multiple fibroids noted.   NURSING  PROVIDER  Office Location [facility] Dating by LMP c/w U/S at 10 wks  [facility] Model Traditional Anatomy U/S incomplete  Initiated care at  10wks                Language  English              LAB RESULTS   Support Person Mom and FOB Genetics NIPS: low risks AFP: Negative    NT/IT (FT only)     Carrier Screen Horizon: negative x 4  Rhogam  B/Positive/-- (01/16 1132) A1C/GTT Early HgbA1C: 5.6 Third trimester 2 hr GTT: WNL  Flu Vaccine  No    TDaP Vaccine   Blood Type B/Positive/-- (01/16 1132)  RSV Vaccine  Antibody Negative (01/16 1132)  COVID Vaccine  No Rubella 1.50 (01/16 1132)  Feeding Plan breast RPR Non Reactive (05/15 0902)  Contraception Undecided (on heart medicine) HBsAg Negative (01/16 1132)  Circumcision Girl-Dakota HIV Non Reactive (05/15 0902)  Pediatrician    HCVAb Non Reactive (01/16 1132)  Prenatal Classes     BTL Consent  Pap Diagnosis  Date Value Ref Range Status  08/08/2021   Final   - Negative for intraepithelial lesion or malignancy (NILM)    BTL Pre-payment  GC/CT Initial:   36wks:    VBAC Consent  GBS   For PCN allergy, check sensitivities   BRx Optimized? [ ]  yes   [ X] no    DME Rx [X]  BP cuff [ ]  Weight Scale Waterbirth  [ ]  Class [ ]  Consent [ ]  CNM visit  PHQ9 & GAD7 [X]  new OB [ x ] 28 weeks  [  ] 36 weeks Induction  [ ]  Orders Entered [ ] Foley Y/N     OB History     Gravida  3   Para  0   Term  0   Preterm  0   AB  2   Living  0      SAB  1   IAB  1   Ectopic  0   Multiple  0   Live Births                Past Medical History:  Diagnosis Date   Anemia    ASCUS with positive high risk HPV 03/22/2013   Chlamydia 06/22/2013   Dysmenorrhea  resolved   Endometriosis    Vitamin D  deficiency 03/22/2013   Past Surgical History:  Procedure Laterality Date   DILATION AND CURETTAGE OF UTERUS N/A 11/25/2020   Procedure: SUCTION DILATATION AND CURETTAGE;  Surgeon: Caitlin Slough, MD;  Location: [facility] OR;  Service: Gynecology;  Laterality: N/A;   Family History: family history includes Hypertension in her mother and sister; Kidney failure in her mother. Social History:  reports that she has never smoked. She has never used smokeless tobacco. She reports that she does not currently use alcohol after a past usage of about 1.0 standard drink of alcohol per week. She reports that she does not use drugs.   Prenatal Transfer Tool  Maternal Diabetes: No Genetic Screening: Normal Maternal Ultrasounds/Referrals: Normal Fetal Ultrasounds or other Referrals:  None Maternal Substance Abuse:  No Significant Maternal Medications:  Meds include: Other: Metoprolol , Valtrex  Significant Maternal Lab Results: Other: GBS Unknown   Maternal Assessment:  ROS: -Contractions, -LOF, +Vaginal Bleeding, +Fetal  Movement  All other systems reviewed and negative.    No Known Allergies     Blood pressure 127/77, pulse 87, temperature 98.2 F (36.8 C), temperature source Oral, resp. rate 17, height 5' 6 (1.676 m), weight 114.3 kg, last menstrual period 11/29/2022, SpO2 96%.  Physical Exam Vitals and nursing note reviewed.  Constitutional:      Appearance: Normal appearance.  HENT:     Head: Normocephalic and atraumatic.  Eyes:     Conjunctiva/sclera: Conjunctivae normal.  Cardiovascular:     Rate and Rhythm: Normal rate.  Pulmonary:     Effort: Pulmonary effort is normal. No respiratory distress.  Abdominal:     Palpations: Abdomen is soft.     Tenderness: There is no abdominal tenderness.     Comments: Gravid, Appears AGA  Genitourinary:    General: Normal vulva.     Comments: GBS culture collected.  Musculoskeletal:        General: Normal range of motion.     Cervical back: Normal range of motion.  Skin:    General: Skin is warm and dry.  Neurological:     Mental Status: She is alert and oriented to person, place, and time.  Psychiatric:        Mood and Affect: Mood normal.        Behavior: Behavior normal.     Fetal Assessment: Leopolds: -Pelvis: Presumed Adequate -EFW: 2384g by US  -Presentation: Vertex by US   FHR: 155 bpm, Mod Var, -Decels, +Accels UCs:  Irregular    Assessment IUP at 34.3 weeks Cat I FT pPROM GBS Unknown Leiomyomas-Multiple H/O HSV Heart Palpitations  Plan: Admit to [employer]  Routine Labor and Delivery Orders per Protocol In room to complete assessment and discuss POC: -Discussed augmentation with pitocin  and patient agreeable. Reviewed r/b of pitocin  including fetal intolerance, tachysystole, as well as stronger contractions and decreased labor time. -Augmentation orders placed.  -Reviewed option of BMZ dosing and patient desires.  -Reviewed preterm delivery at 34 weeks. Discussed presence of NICU team and respiratory  therapist. -Patient continued on metoprolol  and valtrex .  -GBS by PCR -Consider TXA for after delivery considering known fibroids which can increase risk for PPH.   Caitlin LITTIE Mock, MSN 07/28/2023, 1:00 AM

## 2023-07-29 LAB — CBC
HCT: 28.3 % — ABNORMAL LOW (ref 36.0–46.0)
Hemoglobin: 9 g/dL — ABNORMAL LOW (ref 12.0–15.0)
MCH: 28 pg (ref 26.0–34.0)
MCHC: 31.8 g/dL (ref 30.0–36.0)
MCV: 88.2 fL (ref 80.0–100.0)
Platelets: 180 K/uL (ref 150–400)
RBC: 3.21 MIL/uL — ABNORMAL LOW (ref 3.87–5.11)
RDW: 13.5 % (ref 11.5–15.5)
WBC: 11.7 K/uL — ABNORMAL HIGH (ref 4.0–10.5)
nRBC: 0 % (ref 0.0–0.2)

## 2023-07-29 MED ORDER — DIPHENHYDRAMINE HCL 50 MG/ML IJ SOLN: 25.0000 mg | Freq: Once | INTRAMUSCULAR | Status: DC | PRN

## 2023-07-29 MED ORDER — SODIUM CHLORIDE 0.9 % IV SOLN: INTRAVENOUS | Status: AC | PRN

## 2023-07-29 MED ORDER — ALBUTEROL SULFATE (2.5 MG/3ML) 0.083% IN NEBU: 2.5000 mg | INHALATION_SOLUTION | Freq: Once | RESPIRATORY_TRACT | Status: DC | PRN

## 2023-07-29 MED ORDER — METHYLPREDNISOLONE SODIUM SUCC 125 MG IJ SOLR: 125.0000 mg | Freq: Once | INTRAMUSCULAR | Status: DC | PRN

## 2023-07-29 MED ORDER — SODIUM CHLORIDE 0.9 % IV SOLN
500.0000 mg | Freq: Once | INTRAVENOUS | Status: AC
  Administered 2023-07-29: 500 mg via INTRAVENOUS
  Filled 2023-07-29: qty 25

## 2023-07-29 MED ORDER — EPINEPHRINE 0.3 MG/0.3ML IJ SOAJ: 0.3000 mg | Freq: Once | INTRAMUSCULAR | Status: DC | PRN

## 2023-07-29 NOTE — Progress Notes (Signed)
 Patient ID: Caitlin Duncan, female   DOB: 1990-07-24, 33 y.o.   MRN: [id number]  Post Partum Day One :S/P Preterm Vaginal Delivery  Subjective: Patient up ad lib, denies syncope or dizziness. Reports consuming regular diet without issues and denies N/V. Denies issues with urination and reports bleeding is spotting.  Patient is pumping and reports milk has not come in yet. Unsure of method for postpartum contraception.  Pain is being managed with use of ibuprofen  and tylenol , but patient reports it is not completely effective.   Objective: Vitals:   07/28/23 1513 07/28/23 1950 07/28/23 2326 07/29/23 0247  BP: 137/79 124/72 (!) 126/90 127/77  Pulse: 86 86 77 76  Resp: 18 16 18 16   Temp: 98.6 F (37 C) 98.4 F (36.9 C) 98.7 F (37.1 C) (!) 97.5 F (36.4 C)  TempSrc: Oral Oral Oral Oral  SpO2: 100% 100% 100% 100%  Weight:      Height:       Recent Labs    07/28/23 0142 07/29/23 0500  HGB 11.4* 9.0*  HCT 34.2* 28.3*    Physical Exam:  General: cooperative, fatigued, and no distress Mood/Affect: Appropriate/Appropriate Lungs: clear to auscultation, no wheezes, rales or rhonchi, symmetric air entry.  Heart: normal rate and regular rhythm. Breast: not examined. Abdomen:  + bowel sounds, Soft Uterine Fundus: firm at umbilicus Lochia: appropriate Laceration: Not examined Skin: Warm, Dry DVT Evaluation: No evidence of DVT seen on physical exam. Calf/Ankle edema is present.  Assessment S/P Vaginal Delivery-Day One Normal Involution BreastFeeding Anemia  Plan: -Congratulations given. -Discussed hemoglobin decrease. Patient agreeable to IV iron  infusion. -Instructed to continue iron  supplement at home.  -Briefly discussed postpartum care upon discharge. -Reviewed pain management. Informed of usage of oxycodone  for breakthrough pain.  -Continue current care. -L&D team to be updated on patient status.   Harlene LITTIE Duncans, MSN, CNM 07/29/2023, 4:01 AM

## 2023-07-29 NOTE — Progress Notes (Signed)

## 2023-07-30 ENCOUNTER — Other Ambulatory Visit (HOSPITAL_COMMUNITY): Payer: Self-pay

## 2023-07-30 MED ORDER — IBUPROFEN 600 MG PO TABS
600.0000 mg | ORAL_TABLET | Freq: Four times a day (QID) | ORAL | 1 refills | Status: DC
  Filled 2023-07-30: qty 30, 8d supply, fill #0

## 2023-07-30 MED ORDER — OXYCODONE-ACETAMINOPHEN 5-325 MG PO TABS
2.0000 | ORAL_TABLET | Freq: Once | ORAL | Status: AC
  Administered 2023-07-30: 2 via ORAL
  Filled 2023-07-30: qty 2

## 2023-07-30 MED ORDER — OXYCODONE-ACETAMINOPHEN 5-325 MG PO TABS
1.0000 | ORAL_TABLET | Freq: Four times a day (QID) | ORAL | 0 refills | Status: AC | PRN
  Filled 2023-07-30: qty 10, 2d supply, fill #0

## 2023-07-30 NOTE — Progress Notes (Signed)
  Progress Note   Date: 07/29/2023  Patient Name: Caitlin Duncan        MRN#: [id number]  Clarification of diagnosis:  Acute blood loss anemia

## 2023-07-30 NOTE — Lactation Note (Signed)
 This note was copied from a baby's chart.  NICU Lactation Consultation Note  Patient Name: Girl Roneisha Blasius Unijb'd Date: 07/30/2023 Age:33 years  Reason for consult: Follow-up assessment; NICU baby; Primapara; 1st time breastfeeding; Late-preterm 34-36.6wks; Infant < 6lbs; Maternal discharge  SUBJECTIVE  LC in to visit with P1 Mom of LPT baby [place] in the NICU.  Baby is being gavage fed and is scoring 3's and 4's for feeding readiness.   Mom admits to not pumping consistently.  LC encouraged more consistent pumping to avoid engorgement.  Treatment for engorgement reviewed.  LC cleaned pump parts, tossed the colostrum protectors.  LC sanitized the pump parts, educating Mom on the importance of notifying baby's RN that pump parts are clean and ready for sanitization.  Mom encouraged to do lots of STS to stimulate her milk producing hormones.  Mom encouraged to massage her breasts during pumping and STS.    OBJECTIVE Infant data: No data recorded O2 Device: Room Air  Infant feeding assessment IDFTS - Readiness: 3   Maternal data: [id number] Vaginal, Spontaneous Pumping frequency: 5-6 times per 24 hrs, Mom to increase to 8 times per 24 hrs Pumped volume: 3 mL Flange Size: 21 Hands-free pumping top sizes: Large Alejos)  WIC Program: No WIC Referral Sent?: No Pump: DEBP, Personal (Spectra )  ASSESSMENT Infant:  Feeding Status: Scheduled 9-12-3-6 Feeding method: Tube/Gavage (Bolus)  Maternal: Milk volume: Normal  INTERVENTIONS/PLAN Interventions: Interventions: Breast feeding basics reviewed; Skin to skin; Breast massage; Hand express; DEBP; Education Discharge Education: Engorgement and breast care Tools: Pump; Flanges; Hands-free pumping top Pump Education: Setup, frequency, and cleaning; Milk Storage  Plan: Consult Status: NICU follow-up NICU Follow-up type: Verify absence of engorgement; Verify onset of copious milk   Claudene Aleck BRAVO 07/30/2023, 4:55 PM

## 2023-07-30 NOTE — Anesthesia Postprocedure Evaluation (Signed)
 Anesthesia Post Note  Patient: Caitlin Duncan  Procedure(s) Performed: AN AD HOC LABOR EPIDURAL     Patient location during evaluation: Mother Baby Anesthesia Type: Epidural Level of consciousness: awake and alert and oriented Pain management: satisfactory to patient Vital Signs Assessment: post-procedure vital signs reviewed and stable Respiratory status: respiratory function stable Cardiovascular status: stable Postop Assessment: no headache, no backache, epidural receding, patient able to bend at knees, no signs of nausea or vomiting, adequate PO intake and able to ambulate Anesthetic complications: no   No notable events documented.  Last Vitals:  Vitals:   07/29/23 2027 07/30/23 0926  BP: 130/79 113/84  Pulse: 86 76  Resp: 18 18  Temp: 36.9 C 36.9 C  SpO2: 100%     Last Pain:  Vitals:   07/30/23 0926  TempSrc: Oral  PainSc:    Pain Goal: Patients Stated Pain Goal: 2 (07/29/23 0249)                 PURNELL PORTAL

## 2023-07-30 NOTE — Progress Notes (Addendum)
 Patient ID: Caitlin Duncan, female   DOB: 1990-09-18, 33 y.o.   MRN: [id number]  Post Partum Day Two :S/P Preterm Vaginal Delivery  Subjective: Patient up ad lib, denies syncope or dizziness. Reports consuming regular diet without issues and denies N/V. Denies issues with urination and reports bleeding is not heavy.  Patient is pumping and denies issues with nipples.  Remains unsure of postpartum contraception.  Patient reports pain is not well managed and currently rates it a 7/10.  She reports taking ibuprofen , tylenol , and oxycodone .  Objective: Vitals:   07/29/23 0851 07/29/23 1310 07/29/23 1400 07/29/23 2027  BP: 105/67 119/72 113/76 130/79  Pulse: 71 75 69 86  Resp: 18 20 17 18   Temp: 98.6 F (37 C) 98.6 F (37 C) 98.6 F (37 C) 98.5 F (36.9 C)  TempSrc: Oral Oral Oral Oral  SpO2: 100% 100% 95% 100%  Weight:      Height:       Recent Labs    07/28/23 0142 07/29/23 0500  HGB 11.4* 9.0*  HCT 34.2* 28.3*    Physical Exam:  General: cooperative and no distress Mood/Affect: Appropriate/Appropriate Lungs: No respiratory distress.  Heart: not examined. Breast: not examined. Abdomen:  Soft, some tenderness at fundus.  Uterine Fundus: firm, U/-1 Lochia: appropriate Laceration: Not examined Skin: Warm, Dry DVT Evaluation: No evidence of DVT seen on physical exam. Calf/Ankle edema is present.  Assessment S/P Vaginal Delivery-Day Two Normal Involution Pumping Anemia Pain  Plan: -Discussed discharge with pain medication.  -Last dose oxycodone  at 0300, will give 2 tablets of percocet now.  -Will send prescriptions to transition pharmacy as infant will remain under NICU care.  -Continue iron  supplement.  -Postpartum care reviewed. Information regarding contraception placed in chart.  -L&D team to be updated on patient status.   Caitlin LITTIE Duncans, MSN, CNM 07/30/2023, 7:01 AM

## 2023-07-30 NOTE — Consult Note (Signed)
   Outpatient Lactation Booking Note  Visited parent to establish outpatient lactation care. Parent is doing well and reports that baby is doing good. Parent reports that she is pumping every 3 hours and isn't getting anything yet but states that she believes its due to how early baby was born. LC praised parent for hard work. Parent reports that she doesn't have WIC yet but plans to go when she is discharged. LC educated parent that she could get a pump from [facility] if needed since her baby is in the NICU. Parent states that she has a spectra  1 at home.   Parent accepted outpatient lactation care.  Future Appointments  Date Time Provider Department Center  08/01/2023 11:00 AM Hester Alm BROCKS, MD [facility] None  08/06/2023  8:40 AM Tobb, Kardie, DO [facility] H[facility]V  08/07/2023 10:00 AM [facility] CONSULTANT [facility]  08/26/2023 10:15 AM Delores Nidia CROME, FNP [facility] None    Appointment instructions added to discharge instructions.  Geraldina Louder, [facility] Center for [facility]

## 2023-07-30 NOTE — Patient Instructions (Signed)
 Your appointment with Outpatient Lactation is: Date:08/07/2023 Time:10:00 AM [facility] (First Floor) [address] [place]  Check in under baby's name.  Please bring your baby hungry along with your pump and a bottle of either formula or expressed breast milk. Please also bring your pump flanges and we welcome support people! If you need lactation assistance before your appointment, please call [phone] and press 4 for lactation.

## 2023-08-01 ENCOUNTER — Encounter: Admitting: Physician Assistant

## 2023-08-01 ENCOUNTER — Ambulatory Visit: Payer: 59 | Admitting: Dermatology

## 2023-08-04 NOTE — Lactation Note (Signed)
 This note was copied from a baby's chart.  NICU Lactation Consultation Note  Patient Name: Caitlin Duncan Date: 08/04/2023 Age:33 days  Reason for consult: Follow-up assessment; Primapara; 1st time breastfeeding; NICU baby; Late-preterm 34-36.6wks; Infant < 6lbs; RN request  SUBJECTIVE  LC in to visit with P1 Mom of LPT baby Caitlin Duncan in the NICU.  Baby started PO feeding today.  RN asked LC to consult as Mom had stated to her that she didn't have a pump at home.  RN gave her a hand pump.  Mom states she has a Spectra  at home, but her flange size is wrong.  LC encouraged Mom to go online and order Spectra  pump flange inserts.  She is using a 21 mm flange.  Mom states she is pumping about 6 times per 24 hrs, but stays in baby's room and uses the Medela Symphony at the bedside.  Mom denies engorgement.  LC asked Mom if she would like to breastfeed Caitlin Duncan and Mom said yes.  LC offered to assist on 7/15 at 2pm or 5pm.  Mom states she has an MD appt early and a WIC appt at 12 noon.  LC asked Mom if she was getting a WIC pump and she said yes.  LC provided Mom with another pump kit to have for her home pump.  Mom asked whether her pump parts needed sanitizing and Mom stated that she has been on it.    Encouraged continued STS and frequent pumping to support a full milk supply.  OBJECTIVE Infant data: No data recorded O2 Device: Room Air  Infant feeding assessment IDFTS - Readiness: 3 IDFTS - Quality: 3   Maternal data: [id number] Vaginal, Spontaneous Pumping frequency: Mom states 6 times per 24 hrs Pumped volume: 40 mL Flange Size: 21  WIC Program: Yes WIC Referral Sent?: No (Mom states she has a WIC appt 7/15) What county?: [place] Pump: DEBP, Personal (Spectra  pump)  ASSESSMENT Infant:  Feeding Status: Scheduled 9-12-3-6 Feeding method: Tube/Gavage (Bolus)  Maternal: Milk volume: Low  INTERVENTIONS/PLAN Interventions: Interventions: Breast feeding basics  reviewed; Skin to skin; Breast massage; Hand express; DEBP; Education Tools: Pump; Flanges Pump Education: Setup, frequency, and cleaning; Milk Storage  Plan: Consult Status: NICU follow-up NICU Follow-up type: Verify onset of copious milk; Verify absence of engorgement   Caitlin Duncan 08/04/2023, 4:37 PM

## 2023-08-05 ENCOUNTER — Encounter (HOSPITAL_BASED_OUTPATIENT_CLINIC_OR_DEPARTMENT_OTHER): Payer: Self-pay | Admitting: *Deleted

## 2023-08-06 ENCOUNTER — Ambulatory Visit: Admitting: Cardiology

## 2023-08-06 ENCOUNTER — Telehealth: Payer: Self-pay

## 2023-08-06 NOTE — Telephone Encounter (Signed)
 LC called to remind lactating parent Sharren appointment on   Future Appointments  Date Time Provider Department Center  08/07/2023 10:00 AM [facility]CT[facility] CONSULTANT [facility]  08/26/2023 10:15 AM Delores Nidia CROME, FNP [facility] None  08/27/2023  3:15 PM Hester Alm BROCKS, MD [facility] None  10/29/2023 11:00 AM Tobb, Kardie, DO [facility]   Parent answered? Yes, Date: 08/06/2023 Message was left No  LC reminded lactating parent to bring baby hungry, a bottle of milk, their pump, and a support person is always welcome. Reminded parent of contact information to call if anything changes [phone].    Geraldina Louder, [facility] Center for [facility]

## 2023-08-06 NOTE — Lactation Note (Signed)
 This note was copied from a baby's chart.  NICU Lactation Consultation Note  Patient Name: Girl Cheresa Odenthal Unijb'd Date: 08/06/2023 Age:33 days  Reason for consult: Follow-up assessment; NICU baby; Primapara; 1st time breastfeeding; Late-preterm 34-36.6wks; Infant < 6lbs  SUBJECTIVE  LC visited with P1 Mom of baby [place] in the NICU.  Mom states she received her WIC pump today.  Mom also admits to using the hand pump and collecting about 30 ml per pump. Mom says she pumps every 3 hrs.   Baby taking 35% PO.  LC offered to assist with a breastfeeding on 7/17, Mom unsure when she will be here.  LC suggested she notify baby's RN if she would like LC assistance at a feeding on 7/17.    Mom encouraged to do STS with baby and ask for assistance with latching to the breast prn.   OBJECTIVE Infant data: No data recorded O2 Device: Room Air  Infant feeding assessment IDFTS - Readiness: 2 IDFTS - Quality: 3 (some spilling from sides of mouth)   Maternal data: [id number] Vaginal, Spontaneous Pumping frequency: Mom reports to be pumping every 3 hrs. Pumped volume: 30 mL Flange Size: 21  WIC Program: Yes WIC Referral Sent?: Yes What county?: [place] Pump: WIC Pump  ASSESSMENT Infant:  Feeding Status: Scheduled 9-12-3-6 Feeding method: Bottle; Tube/Gavage (Bolus) Nipple Type: Dr. Jonna Fling Preemie  Maternal: Milk volume: Low  INTERVENTIONS/PLAN Interventions: Interventions: Breast feeding basics reviewed; Skin to skin; Breast massage; Hand express; DEBP Tools: Pump; Flanges; Bottle Pump Education: Setup, frequency, and cleaning; Milk Storage  Plan: Consult Status: NICU follow-up NICU Follow-up type: Verify onset of copious milk; Verify absence of engorgement; Weekly NICU follow up   Claudene Aleck BRAVO 08/06/2023, 3:54 PM

## 2023-08-07 ENCOUNTER — Ambulatory Visit: Admitting: Lactation Services

## 2023-08-07 DIAGNOSIS — Z9189 Other specified personal risk factors, not elsewhere classified: Secondary | ICD-10-CM | POA: Diagnosis not present

## 2023-08-07 NOTE — Progress Notes (Cosign Needed Addendum)
 Patient in office to discuss pumping. [place] is in the NICU. Born due to PPROM. Infant has started taking a bottle this week and is doing well. She is now 5 pounds 12 ounces. She is spending a lot of time in the NICU with little time at home. Mom has a camera that she can watch.   Patient reports she was not able to get a pump from [facility], she can get one on 7/25. She is waiting on [facility] approval. She brought her Lansinoh pump, was missing membranes and would not work. She does have them at home. Measured her for # 21 flanges. Reviewed proper flange fit.  She is pumping in the NICU with the DEBP, she uses a manual pump at home. She is pumping every 2-3 hours. She tries to pump when infant is feeding. She pumped twice yesterday as she had errands she has ordered a wearable pump from insurance it has not been mailed yet.   The last few day she is getting about 35 ml per pumping, previously up to 5-20 ml.  She has been watching the pump- reviewed relaxation with pumping Reviewed eating and drinking for producing breast milk Reviewed supply and demand and importance of emptying the breasts at least 8-12 x a day Reviewed proper flange fit, patient has flange inserts She is taking Pink Stork Total Lactation and feels it is helping Reviewed caloric and fluid intake while breast feeding Reviewed galactagogues dosages, side effects and where to obtain Reviewed healthy finger foods to eat while pumping if not able to eat a full meal  Dad works out of town, mom is not planning to go back to work until at least November.  MGM is helpful to mom. Mom tearful needing to be at appointments without baby and is aware infant is improving and will be able to come home soon.  Mom taken to [employer] prior to leaving.

## 2023-08-07 NOTE — Patient Instructions (Addendum)
 Pump 8-12 times a day for 15-20 minutes with your double electric breast pump Use the NICU pump as often as you can Relax with pumping Use the #21 flanges with pumping, if areola is pulled into the barrel of the pump, decrease to # 19 flanges 4.   Call with any questions or concerns as needed [phone] option 4  For Breastfeeding:   NOTHING WILL WORK UNLESS YOU ARE  - Emptying the breast adequately/completely - Emptying the breast regularly  AVOID PEPPERMINT AND SAGE  Supplement/Herb Purpose Dose Side Effects  Vitamin D  Increase Vitamin D  to infant, recommended for all breastfeeding moms and can use instead of supplementing infant 5,000 IU daily None  Goats Rue  Can start at [redacted] weeks gestation to increase glandular tissue 500-1,000 mg 3-4 x a day Digestive issues  MilkWorks IR+ Whole Food Moringa, Myo Inositol + D-Chiro-Inositol and Beta Glucans Blend for Optimal Milk Production Whole food based supplement formulated to support optimal milk production by balancing prolacin, oxytocin , glucose, and insulin 1 capsule 3 x a day Digestive issues, headache, rash, low blood pressure, possible interaction with thyroid  medications/ thyroid  hormone levels  Legendairy Milk Supplements -PumpPrincess -Liquid Gold -Cash Cow -Milkapalooza  Combination of herbal galactogues  Per packaging (Rare) Digestive issues, headache, rash, low blood pressure, possible interaction with thyroid  medications/ thyroid  hormone levels  Lactation cookies/bars Food based galactogues/increase maternal calories Daily Digestive issues  Flax seeds Food based Galactogue Daily Digestive issues  Medical illustrator based Biomedical engineer Daily Digestive issues  Hemp Hearts Food based Galactogue Daily Digestive issues  Oats Food based Galactogue Daily Digestive issues  Lecithin (Soy or Sunflower) Decreases the viscosity of milk, galactogue, fat emulsifier  Good for oversupply mothers to prevent clogged ducts 1200mg  three times  per day Digestive issues  Rehydration drinks (Gatorade, Poweraid, Body Armor, Coconut Water, Liquid IV)   Improves maternal hydration and mammary glands are histologically similar to sweat glands Limit 1 a day None, caution use in patients with diabetes     Fenugreek *Use with extreme caution as this can decrease milk supply in some especially those with thyroid  disorders.   *Do not take if allergic to peanuts, chickpeas, soybeans, or other legumes*  *Do not take if sensitivity to maple syrup or if you have asthma*   Increases prolactin  1,200-1,800 mg 3 x a day Nausea, Loose stools and smelling like maple syrup  Reglan Increases Prolactin Levels 10 Mg 3 times a day. Must obtain prescription from OB Sleepiness Increased anxiety/depression Tardive Dyskensia   Reena Forster, RN, [facility]

## 2023-08-08 ENCOUNTER — Telehealth (HOSPITAL_COMMUNITY): Payer: Self-pay | Admitting: *Deleted

## 2023-08-08 NOTE — Telephone Encounter (Signed)
 08/08/2023  Name: Caitlin Duncan MRN: [id number] DOB: March 01, 1990  Reason for Call:  Transition of Care Hospital Discharge Call  Contact Status: Patient Contact Status: Complete  Language assistant needed: Interpreter Mode: Interpreter Not Needed        Follow-Up Questions: Do You Have Any Concerns About Your Health As You Heal From Delivery?: Yes What Concerns Do You Have About Your Health?: Patient questions if swelling in feet is normal. Reassured patient that some swelling in her feet can be normal off and on for about two weeks. Patient is up a lot on her feet with baby in the NICU. Encouraged patient to stay hydrated, empty bladder frequently, and elevate feet when resting. Patient states that the swelling is only in her feet. Discussed with patient the importance to call her provider if she notices sudden onset of swelling in her hands and face or weight gain of more than five pounds in one week. Do You Have Any Concerns About Your Infants Health?: Infant in NICU  Edinburgh Postnatal Depression Scale:  In the Past 7 Days: I have been able to laugh and see the funny side of things.: As much as I always could I have looked forward with enjoyment to things.: As much as I ever did I have blamed myself unnecessarily when things went wrong.: No, never I have been anxious or worried for no good reason.: Yes, sometimes I have felt scared or panicky for no good reason.: Yes, sometimes Things have been getting on top of me.: Yes, sometimes I haven't been coping as well as usual I have been so unhappy that I have had difficulty sleeping.: Not at all I have felt sad or miserable.: Yes, quite often I have been so unhappy that I have been crying.: Only occasionally The thought of harming myself has occurred to me.: Never Edinburgh Postnatal Depression Scale Total: 9  PHQ2-9 Depression Scale:     Discharge Follow-up: Edinburgh score requires follow up?: No Patient was advised of the  following resources:: Support Group, Breastfeeding Support Group  Post-discharge interventions: Reviewed Newborn Safe Sleep Practices  Steva Banter, RN 08/08/2023 1506

## 2023-08-19 ENCOUNTER — Ambulatory Visit

## 2023-08-23 ENCOUNTER — Other Ambulatory Visit (HOSPITAL_COMMUNITY): Payer: Self-pay

## 2023-08-26 ENCOUNTER — Ambulatory Visit: Admitting: Obstetrics and Gynecology

## 2023-08-27 ENCOUNTER — Ambulatory Visit: Admitting: Dermatology

## 2023-09-05 ENCOUNTER — Encounter: Payer: Self-pay | Admitting: Obstetrics and Gynecology

## 2023-09-05 ENCOUNTER — Ambulatory Visit: Admitting: Obstetrics and Gynecology

## 2023-09-05 DIAGNOSIS — Z30011 Encounter for initial prescription of contraceptive pills: Secondary | ICD-10-CM

## 2023-09-05 DIAGNOSIS — Z8759 Personal history of other complications of pregnancy, childbirth and the puerperium: Secondary | ICD-10-CM | POA: Diagnosis not present

## 2023-09-05 DIAGNOSIS — Z8751 Personal history of pre-term labor: Secondary | ICD-10-CM | POA: Diagnosis not present

## 2023-09-05 MED ORDER — IBUPROFEN 600 MG PO TABS: 600.0000 mg | ORAL_TABLET | Freq: Four times a day (QID) | ORAL | 5 refills | Status: AC

## 2023-09-05 MED ORDER — NORETHINDRONE 0.35 MG PO TABS: 1.0000 | ORAL_TABLET | Freq: Every day | ORAL | 11 refills | Status: AC

## 2023-09-05 NOTE — Progress Notes (Signed)
 Post Partum Visit Note  Caitlin Duncan is a 33 y.o. [phone] female who presents for a postpartum visit. She is 5 weeks postpartum following a normal spontaneous vaginal delivery.  I have fully reviewed the prenatal and intrapartum course. The delivery was at 34 gestational weeks due to PPROM Anesthesia: epidural. Postpartum course has been okay. Baby is doing well. Baby is feeding by both breast and bottle - Similac Neosure. Bleeding no bleeding. Bowel function is normal. Bladder function is normal. Patient is not sexually active. Contraception method is oral progesterone-only contraceptive. Postpartum depression screening: negative.   The pregnancy intention screening data noted above was reviewed. Potential methods of contraception were discussed. The patient elected to proceed with No data recorded.    Health Maintenance Due  Topic Date Due   Hepatitis B Vaccines 19-59 Average Risk (1 of 3 - 19+ 3-dose series) Never done   COVID-19 Vaccine (1 - 2024-25 season) Never done   INFLUENZA VACCINE  08/23/2023    The following portions of the patient's history were reviewed and updated as appropriate: allergies, current medications, past family history, past medical history, past social history, past surgical history, and problem list.  Review of Systems Pertinent items are noted in HPI.  Objective:  LMP 11/29/2022    General:  alert and cooperative   Breasts:  Not indicated  Lungs: Normal effort        GU exam:  not indicated       Assessment:   1. Postpartum exam (Primary)  - ibuprofen  (ADVIL ) 600 MG tablet; Take 1 tablet (600 mg total) by mouth every 6 (six) hours.  Dispense: 30 tablet; Refill: 5  2. Encounter for initial prescription of contraceptive pills Currently on menstrual cycle, discussed initiation, back up method, missed pill window - norethindrone  (MICRONOR ) 0.35 MG tablet; Take 1 tablet (0.35 mg total) by mouth daily.  Dispense: 28 tablet; Refill:  11    Plan:   Essential components of care per ACOG recommendations:  1.  Mood and well being: Patient with negative depression screening today. Reviewed local resources for support.  - Patient tobacco use? No.   - hx of drug use? No.    2. Infant care and feeding:  -Patient currently breastmilk feeding? Yes, breast and bottle -Social determinants of health (SDOH) reviewed in EPIC. No concerns  3. Sexuality, contraception and birth spacing - Patient does not want a pregnancy in the next year.   - Reviewed reproductive life planning. Reviewed contraceptive methods based on pt preferences and effectiveness.  Patient desired Oral Contraceptive today.   - Discussed birth spacing of 18 months  4. Sleep and fatigue -Encouraged family/partner/community support of 4 hrs of uninterrupted sleep to help with mood and fatigue  5. Physical Recovery  - Discussed patients delivery and complications. She describes her labor as good. - Patient had a vaginal delivery at 34 weeks Patient had a 2nd degree laceration. Perineal healing reviewed. Patient expressed understanding - Patient has urinary incontinence? No. - Patient is safe to resume physical and sexual activity  6.  Health Maintenance - HM due items addressed Yes - Last pap smear  Diagnosis  Date Value Ref Range Status  08/08/2021   Final   - Negative for intraepithelial lesion or malignancy (NILM)   Pap smear not done at today's visit.  -Breast Cancer screening indicated? No.   7. Chronic Disease/Pregnancy Condition follow up: None  - PCP follow up  Nidia Daring, FNP [facility] [employer],  [facility]

## 2023-09-07 DIAGNOSIS — Z8759 Personal history of other complications of pregnancy, childbirth and the puerperium: Secondary | ICD-10-CM | POA: Insufficient documentation

## 2023-09-07 DIAGNOSIS — Z8751 Personal history of pre-term labor: Secondary | ICD-10-CM | POA: Insufficient documentation

## 2023-10-29 ENCOUNTER — Encounter: Payer: Self-pay | Admitting: Cardiology

## 2023-10-29 ENCOUNTER — Ambulatory Visit: Attending: Cardiology | Admitting: Cardiology

## 2023-10-29 DIAGNOSIS — R002 Palpitations: Secondary | ICD-10-CM | POA: Diagnosis not present

## 2023-10-29 MED ORDER — METOPROLOL SUCCINATE ER 25 MG PO TB24: 25.0000 mg | ORAL_TABLET | Freq: Every day | ORAL | 3 refills | Status: AC

## 2023-10-29 NOTE — Progress Notes (Signed)
 Cardio-Obstetrics Clinic  New Evaluation  Date:  10/29/2023   ID:  Caitlin Duncan, DOB February 19, 1990, MRN [id number]  PCP:  Freddrick Johns   [facility] Providers Cardiologist:  Dub Huntsman, DO  Electrophysiologist:  None       Referring MD: No ref. provider found   Chief Complaint: I am having palpitations  History of Present Illness:    Caitlin Duncan is a 33 y.o. female [G3P0121] who is being seen today for a follow up visit.   She is 3 months postpartum and offers no complaints at this time.     Prior CV Studies Reviewed: The following studies were reviewed today: ZIO  Past Medical History:  Diagnosis Date   Anemia    ASCUS with positive high risk HPV 03/22/2013   Chlamydia 06/22/2013   Dysmenorrhea    resolved   Endometriosis    Vitamin D  deficiency 03/22/2013    Past Surgical History:  Procedure Laterality Date   DILATION AND CURETTAGE OF UTERUS N/A 11/25/2020   Procedure: SUCTION DILATATION AND CURETTAGE;  Surgeon: Henry Slough, MD;  Location: [facility];  Service: Gynecology;  Laterality: N/A;      OB History     Gravida  3   Para  1   Term  0   Preterm  1   AB  2   Living  1      SAB  1   IAB  1   Ectopic  0   Multiple  0   Live Births  1               Current Medications: Current Meds  Medication Sig   ibuprofen  (ADVIL ) 600 MG tablet Take 1 tablet (600 mg total) by mouth every 6 (six) hours.   Iron , Ferrous Sulfate , 325 (65 Fe) MG TABS 1 tablet twice a day. Take with vitamin C source   Prenatal Vit-Fe Fumarate-FA (PRENATAL VITAMIN PO) Take 1 tablet by mouth daily. 3 gummies per day   valACYclovir  (VALTREX ) 500 MG tablet Take 1 tablet (500 mg total) by mouth 2 (two) times daily.   [DISCONTINUED] metoprolol  succinate (TOPROL  XL) 25 MG 24 hr tablet Take 1 tablet (25 mg total) by mouth daily.     Allergies:   Patient has no known allergies.   Social History   Socioeconomic History   Marital status: Single     Spouse name: Not on file   Number of children: Not on file   Years of education: Not on file   Highest education level: Not on file  Occupational History   Not on file  Tobacco Use   Smoking status: Never   Smokeless tobacco: Never  Vaping Use   Vaping status: Never Used  Substance and Sexual Activity   Alcohol use: Not Currently    Alcohol/week: 1.0 standard drink of alcohol    Types: 1 Glasses of wine per week    Comment: 4-5 oz of wine/week   Drug use: No   Sexual activity: Not Currently    Partners: Male  Other Topics Concern   Not on file  Social History Narrative   Lives alone. No pets.   Family lives in [place].   Graduated from [employer] (criminal justice) . Lives alone.  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 May move to [place]. Applied for jobs in [place] (law enforcement)--got delayed due to death in the family, but still planning to do  this.   Updated 04/2017   Social Drivers of Health   Financial Resource Strain: Not on file  Food Insecurity: No Food Insecurity (07/28/2023)   Hunger Vital Sign    Worried About Running Out of Food in the Last Year: Never true    Ran Out of Food in the Last Year: Never true  Transportation Needs: No Transportation Needs (07/28/2023)   PRAPARE - Administrator, Civil Service (Medical): No    Lack of Transportation (Non-Medical): No  Physical Activity: Not on file  Stress: Not on file  Social Connections: Not on file      Family History  Problem Relation Age of Onset   Hypertension Mother    Kidney failure Mother        kidney transplant 2014 (related to pregnancy)   Hypertension Sister    Diabetes Neg Hx    Cancer Neg Hx    Heart disease Neg Hx       ROS:   Please see the history of present illness.    Palpitations All other systems reviewed and are negative.   Labs/EKG Reviewed:    EKG:   EKG  ordered today.  The ekg ordered today demonstrates  sinus rhythm  Recent Labs: 02/07/2023: ALT 8; BUN 10; Creatinine, Ser 0.53; Potassium 4.2; Sodium 138 07/29/2023: Hemoglobin 9.0; Platelets 180   Recent Lipid Panel Lab Results  Component Value Date/Time   CHOL 163 03/14/2011 10:06 AM   TRIG 60 03/14/2011 10:06 AM   HDL 72 03/14/2011 10:06 AM   CHOLHDL 2.3 03/14/2011 10:06 AM   LDLCALC 79 03/14/2011 10:06 AM    Physical Exam:    VS:  BP 130/86 (BP Location: Left Arm, Patient Position: Sitting, Cuff Size: Large)   Pulse (!) 55   Ht 5' 6 (1.676 m)   Wt 246 lb (111.6 kg)   SpO2 100%   BMI 39.71 kg/m     Wt Readings from Last 3 Encounters:  10/29/23 246 lb (111.6 kg)  09/05/23 239 lb 12.8 oz (108.8 kg)  07/28/23 252 lb (114.3 kg)     GEN:  Well nourished, well developed in no acute distress HEENT: Normal NECK: No JVD; No carotid bruits LYMPHATICS: No lymphadenopathy CARDIAC: RRR, no murmurs, rubs, gallops RESPIRATORY:  Clear to auscultation without rales, wheezing or rhonchi  ABDOMEN: Soft, non-tender, non-distended MUSCULOSKELETAL:  No edema; No deformity  SKIN: Warm and dry NEUROLOGIC:  Alert and oriented x 3 PSYCHIATRIC:  Normal affect    Risk Assessment/Risk Calculators:                  ASSESSMENT & PLAN:    Palpitations  Obesity   She is doing well from a cardiovascular standpoint.  No medication changes today.  Will see her in 1 year   Patient Instructions  Medication Instructions:  Refilled metoprolol    *If you need a refill on your cardiac medications before your next appointment, please call your pharmacy*  Follow-Up: At [facility], you and your health needs are our priority.  As part of our continuing mission to provide you with exceptional heart care, our providers are all part of one team.  This team includes your primary Cardiologist (physician) and Advanced Practice Providers or APPs (Physician Assistants and Nurse Practitioners) who all work together to provide you with the  care you need, when you need it.  Your next appointment:   1 year(s)  Provider:   Macall Mccroskey, DO  Dispo:  No follow-ups on file.   Medication Adjustments/Labs and Tests Ordered: Current medicines are reviewed at length with the patient today.  Concerns regarding medicines are outlined above.  Tests Ordered: No orders of the defined types were placed in this encounter.  Medication Changes: Meds ordered this encounter  Medications   metoprolol  succinate (TOPROL  XL) 25 MG 24 hr tablet    Sig: Take 1 tablet (25 mg total) by mouth daily.    Dispense:  90 tablet    Refill:  3

## 2023-10-29 NOTE — Patient Instructions (Signed)
 Medication Instructions:  Refilled metoprolol    *If you need a refill on your cardiac medications before your next appointment, please call your pharmacy*  Follow-Up: At [facility], you and your health needs are our priority.  As part of our continuing mission to provide you with exceptional heart care, our providers are all part of one team.  This team includes your primary Cardiologist (physician) and Advanced Practice Providers or APPs (Physician Assistants and Nurse Practitioners) who all work together to provide you with the care you need, when you need it.  Your next appointment:   1 year(s)  Provider:   Kardie Tobb, DO
